# Patient Record
Sex: Female | Born: 1945 | Race: White | Hispanic: Yes | Marital: Married | Smoking: Never smoker
Health system: Southern US, Community
[De-identification: ages and names within clinical notes are randomized; demographics above are authoritative.]

## PROBLEM LIST (undated history)

## (undated) DIAGNOSIS — R002 Palpitations: Secondary | ICD-10-CM

## (undated) DIAGNOSIS — R7611 Nonspecific reaction to tuberculin skin test without active tuberculosis: Secondary | ICD-10-CM

## (undated) DIAGNOSIS — I480 Paroxysmal atrial fibrillation: Secondary | ICD-10-CM

## (undated) DIAGNOSIS — I10 Essential (primary) hypertension: Secondary | ICD-10-CM

## (undated) DIAGNOSIS — I2721 Secondary pulmonary arterial hypertension: Secondary | ICD-10-CM

## (undated) DIAGNOSIS — E0500 Thyrotoxicosis with diffuse goiter without thyrotoxic crisis or storm: Secondary | ICD-10-CM

## (undated) DIAGNOSIS — E785 Hyperlipidemia, unspecified: Secondary | ICD-10-CM

## (undated) DIAGNOSIS — K529 Noninfective gastroenteritis and colitis, unspecified: Secondary | ICD-10-CM

## (undated) DIAGNOSIS — I38 Endocarditis, valve unspecified: Secondary | ICD-10-CM

## (undated) DIAGNOSIS — E0590 Thyrotoxicosis, unspecified without thyrotoxic crisis or storm: Secondary | ICD-10-CM

## (undated) DIAGNOSIS — I370 Nonrheumatic pulmonary valve stenosis: Secondary | ICD-10-CM

## (undated) HISTORY — DX: Essential (primary) hypertension: I10

## (undated) HISTORY — DX: Palpitations: R00.2

## (undated) HISTORY — DX: Secondary pulmonary arterial hypertension: I27.21

## (undated) HISTORY — DX: Nonspecific reaction to tuberculin skin test without active tuberculosis: R76.11

## (undated) HISTORY — DX: Noninfective gastroenteritis and colitis, unspecified: K52.9

## (undated) HISTORY — DX: Thyrotoxicosis with diffuse goiter without thyrotoxic crisis or storm: E05.00

## (undated) HISTORY — DX: Endocarditis, valve unspecified: I38

## (undated) HISTORY — DX: Paroxysmal atrial fibrillation: I48.0

## (undated) HISTORY — DX: Nonrheumatic pulmonary valve stenosis: I37.0

---

## 2012-10-15 ENCOUNTER — Encounter: Payer: Self-pay | Admitting: Cardiovascular Disease

## 2012-10-15 ENCOUNTER — Ambulatory Visit (INDEPENDENT_AMBULATORY_CARE_PROVIDER_SITE_OTHER): Payer: Medicare Other | Admitting: Cardiovascular Disease

## 2012-10-15 VITALS — BP 160/90 | HR 97 | Ht 63.0 in | Wt 163.8 lb

## 2012-10-15 DIAGNOSIS — I10 Essential (primary) hypertension: Secondary | ICD-10-CM | POA: Insufficient documentation

## 2012-10-15 NOTE — Patient Instructions (Addendum)
Continue same medications.  Call us in 1 week with your blood pressure reading.  Follow up in 6 months.

## 2012-10-15 NOTE — Progress Notes (Signed)
HPI  This is a pleasant 67 year old female who is self-referred for evaluation and management of hypertension. She was diagnosed with hypertension during pregnancy when she was 67 years old. She was placed on medication at that time. She is not aware of history of preeclampsia. She took herself off the medication after delivery. She was noted to be hypertensive again during subsequent pregnancy. She reports being on losartan for many years but recently she wanted to be off medications. After stopping losartan, she checks her blood pressure was 180/90. She went to the emergency room [facility]. She reports having negative workup. She was seen by her family care physician Dr. Hollice Espy. She was resumed on losartan and subsequently hydrochlorothiazide was added and titrated to 25 mg once daily. Her blood pressure is controlled since then. She is completely asymptomatic and denies any chest pain, dyspnea, palpitations or headache. She is physically active and reports no exertional symptoms. She is not aware of any previous cardiac history. There is no family history of premature coronary artery disease. She is not a smoker.   No Known Allergies   No current outpatient prescriptions on file prior to visit.   No current facility-administered medications on file prior to visit.     Past Medical History  Diagnosis Date  . Hypertension      History reviewed. No pertinent past surgical history.   History reviewed. No pertinent family history.   History   Social History  . Marital Status: Married    Spouse Name: N/A    Number of Children: N/A  . Years of Education: N/A   Occupational History  . Not on file.   Social History Main Topics  . Smoking status: Never Smoker   . Smokeless tobacco: Not on file  . Alcohol Use: Not on file  . Drug Use: Not on file  . Sexually Active: Not on file   Other Topics Concern  . Not on file   Social History Narrative  . No narrative on file       ROS Constitutional: Negative for fever, chills, diaphoresis, activity change, appetite change and fatigue.  HENT: Negative for hearing loss, nosebleeds, congestion, sore throat, facial swelling, drooling, trouble swallowing, neck pain, voice change, sinus pressure and tinnitus.  Eyes: Negative for photophobia, pain, discharge and visual disturbance.  Respiratory: Negative for apnea, cough, chest tightness, shortness of breath and wheezing.  Cardiovascular: Negative for chest pain, palpitations and leg swelling.  Gastrointestinal: Negative for nausea, vomiting, abdominal pain, diarrhea, constipation, blood in stool and abdominal distention.  Genitourinary: Negative for dysuria, urgency, frequency, hematuria and decreased urine volume.  Musculoskeletal: Negative for myalgias, back pain, joint swelling, arthralgias and gait problem.  Skin: Negative for color change, pallor, rash and wound.  Neurological: Negative for dizziness, tremors, seizures, syncope, speech difficulty, weakness, light-headedness, numbness and headaches.  Psychiatric/Behavioral: Negative for suicidal ideas, hallucinations, behavioral problems and agitation. The patient is not nervous/anxious.     PHYSICAL EXAM   BP 160/90  Pulse 97  Ht 5\' 3"  (1.6 m)  Wt 163 lb 12 oz (74.277 kg)  BMI 29.01 kg/m2 Constitutional: She is oriented to person, place, and time. She appears well-developed and well-nourished. No distress.  HENT: No nasal discharge.  Head: Normocephalic and atraumatic.  Eyes: Pupils are equal and round. Right eye exhibits no discharge. Left eye exhibits no discharge.  Neck: Normal range of motion. Neck supple. No JVD present. No thyromegaly present.  Cardiovascular: Normal rate, regular rhythm, normal heart  sounds. Exam reveals no gallop and no friction rub. No murmur heard.  Pulmonary/Chest: Effort normal and breath sounds normal. No stridor. No respiratory distress. She has no wheezes. She has no rales.  She exhibits no tenderness.  Abdominal: Soft. Bowel sounds are normal. She exhibits no distension. There is no tenderness. There is no rebound and no guarding. No abdominal bruit.  Musculoskeletal: Normal range of motion. She exhibits no edema and no tenderness.  Neurological: She is alert and oriented to person, place, and time. Coordination normal.  Skin: Skin is warm and dry. No rash noted. She is not diaphoretic. No erythema. No pallor.  Psychiatric: She has a normal mood and affect. Her behavior is normal. Judgment and thought content normal.     EKG: Normal sinus rhythm with nonspecific T wave changes.    ASSESSMENT AND PLAN

## 2012-10-15 NOTE — Assessment & Plan Note (Signed)
The patient appears to have essential hypertension. She started having hypertension at an early age. I discussed with her that most patients with essential hypertension require at least 2 or 3 blood pressure medications in order to control blood pressure. She is already compliant with low sodium diet but the biggest issue seems to be stress which is likely contributing to elevated blood pressure. She reports having routine labs recently and all within normal limits. I advised her to continue treatment with losartan 50 mg once daily and hydrochlorothiazide 25 mg once daily. If her blood pressure remains elevated, losartan can be increased to 100 mg once daily. Given her significant anxiety, a small dose carvedilol can be considered if her blood pressure remains elevated. She has no cardiac symptoms.

## 2012-10-22 ENCOUNTER — Telehealth: Payer: Self-pay | Admitting: *Deleted

## 2012-10-22 ENCOUNTER — Other Ambulatory Visit: Payer: Self-pay

## 2012-10-22 MED ORDER — LOSARTAN POTASSIUM 100 MG PO TABS: 100.0000 mg | ORAL_TABLET | Freq: Every day | ORAL | Status: DC

## 2012-10-22 NOTE — Telephone Encounter (Signed)
See below

## 2012-10-22 NOTE — Telephone Encounter (Signed)
Increase losartan to 100 mg once daily. We can combine Losartan/HCTZ 100/25 mg once daily.

## 2012-10-22 NOTE — Telephone Encounter (Signed)
Pt informed Understanding verb RX sent to pharm 

## 2012-10-22 NOTE — Telephone Encounter (Signed)
Pt calling with BP readings 146/83 p64 151/81 p83 143/85 p86 146/83 p86 149/79 p83 141/88 p88

## 2012-10-25 ENCOUNTER — Telehealth: Payer: Self-pay

## 2012-10-25 NOTE — Telephone Encounter (Signed)
Please call regarding the HCTZ. The patient had taken ziac in the past and would like to know the difference between the two medications since she had taken the ziac in the past and wanted to make sure she should continue on the HCTZ since she will have to renew it today. Her BP yesterday was 157/88 HR 68. Please call regarding medications.

## 2012-10-25 NOTE — Telephone Encounter (Signed)
Please advise 

## 2012-10-25 NOTE — Telephone Encounter (Signed)
Ziac is a combination medication that has a beta blocker and HCTZ. They are not the same.

## 2012-10-26 NOTE — Telephone Encounter (Signed)
Pt informed Understanding verb 

## 2012-11-18 ENCOUNTER — Other Ambulatory Visit: Payer: Self-pay | Admitting: *Deleted

## 2012-11-22 ENCOUNTER — Other Ambulatory Visit: Payer: Self-pay | Admitting: *Deleted

## 2013-02-23 ENCOUNTER — Other Ambulatory Visit: Payer: Self-pay

## 2013-02-23 MED ORDER — HYDROCHLOROTHIAZIDE 12.5 MG PO CAPS: 25.0000 mg | ORAL_CAPSULE | Freq: Every day | ORAL | Status: DC

## 2013-04-15 ENCOUNTER — Encounter: Payer: Self-pay | Admitting: Cardiovascular Disease

## 2013-04-15 ENCOUNTER — Ambulatory Visit (INDEPENDENT_AMBULATORY_CARE_PROVIDER_SITE_OTHER): Payer: Medicare Other | Admitting: Cardiovascular Disease

## 2013-04-15 VITALS — BP 144/82 | HR 73 | Ht 63.0 in | Wt 162.5 lb

## 2013-04-15 DIAGNOSIS — I10 Essential (primary) hypertension: Secondary | ICD-10-CM

## 2013-04-15 NOTE — Patient Instructions (Addendum)
Continue same medications  Follow up in 1 year

## 2013-04-15 NOTE — Assessment & Plan Note (Signed)
She is doing very well on current dose of losartan 100 mg once daily and hydrochlorothiazide 25 mg once daily. Blood pressure is under excellent control and she has no cardiac symptoms or any symptoms related to hypotension. I recommend continuing current medications. I gave her the option of combining the 2 medications together but she prefers to keep them separated. Followup in one year or earlier if needed.

## 2013-04-15 NOTE — Progress Notes (Signed)
Primary care physician: Dr. Barron Alvine  HPI  This is a pleasant 67 year old female who is here today for a followup visit regarding hypertension. She was diagnosed with hypertension during pregnancy when she was 67 years old. She was placed on a medication at that time. She is not aware of history of preeclampsia. She took herself off the medication after delivery. She was noted to be hypertensive again during subsequent pregnancy. She reports being on losartan for many years but recently she wanted to be off medications. After stopping losartan, blood pressure increased significantly. She went to the emergency room at [facility]. She reports having negative workup.  Blood pressure was subsequently controlled with losartan and hydrochlorothiazide with up titration with a dose of 100 and 25 mg.  She has been doing very well and denies any chest pain, dyspnea, dizziness or headache. Home blood pressure is under excellent control.    No Known Allergies   Current Outpatient Prescriptions on File Prior to Visit  Medication Sig Dispense Refill  . hydrochlorothiazide (MICROZIDE) 12.5 MG capsule Take 2 capsules (25 mg total) by mouth daily.  30 capsule  5  . losartan (COZAAR) 100 MG tablet Take 1 tablet (100 mg total) by mouth daily.  90 tablet  3   No current facility-administered medications on file prior to visit.     Past Medical History  Diagnosis Date  . Hypertension      History reviewed. No pertinent past surgical history.   Family History  Problem Relation Age of Onset  . Family history unknown: Yes     History   Social History  . Marital Status: Married    Spouse Name: N/A    Number of Children: N/A  . Years of Education: N/A   Occupational History  . Not on file.   Social History Main Topics  . Smoking status: Never Smoker   . Smokeless tobacco: Not on file  . Alcohol Use: No  . Drug Use: No  . Sexual Activity: Not on file   Other Topics Concern  .  Not on file   Social History Narrative  . No narrative on file      PHYSICAL EXAM   BP 144/82  Pulse 73  Ht 5\' 3"  (1.6 m)  Wt 162 lb 8 oz (73.71 kg)  BMI 28.79 kg/m2 Constitutional: She is oriented to person, place, and time. She appears well-developed and well-nourished. No distress.  HENT: No nasal discharge.  Head: Normocephalic and atraumatic.  Eyes: Pupils are equal and round. Right eye exhibits no discharge. Left eye exhibits no discharge.  Neck: Normal range of motion. Neck supple. No JVD present. No thyromegaly present.  Cardiovascular: Normal rate, regular rhythm, normal heart sounds. Exam reveals no gallop and no friction rub. No murmur heard.  Pulmonary/Chest: Effort normal and breath sounds normal. No stridor. No respiratory distress. She has no wheezes. She has no rales. She exhibits no tenderness.  Abdominal: Soft. Bowel sounds are normal. She exhibits no distension. There is no tenderness. There is no rebound and no guarding. No abdominal bruit.  Musculoskeletal: Normal range of motion. She exhibits no edema and no tenderness.  Neurological: She is alert and oriented to person, place, and time. Coordination normal.  Skin: Skin is warm and dry. No rash noted. She is not diaphoretic. No erythema. No pallor.  Psychiatric: She has a normal mood and affect. Her behavior is normal. Judgment and thought content normal.     YNW:GNFAO  Rhythm  -  Low voltage -  Borderline EKG    ASSESSMENT AND PLAN

## 2013-04-20 ENCOUNTER — Other Ambulatory Visit: Payer: Self-pay

## 2013-04-20 MED ORDER — LOSARTAN POTASSIUM 100 MG PO TABS: 100.0000 mg | ORAL_TABLET | Freq: Every day | ORAL | Status: DC

## 2013-04-20 MED ORDER — HYDROCHLOROTHIAZIDE 12.5 MG PO CAPS: 25.0000 mg | ORAL_CAPSULE | Freq: Every day | ORAL | Status: DC

## 2013-04-26 ENCOUNTER — Telehealth: Payer: Self-pay

## 2013-04-26 MED ORDER — LOSARTAN POTASSIUM-HCTZ 100-25 MG PO TABS: 1.0000 | ORAL_TABLET | Freq: Every day | ORAL | Status: DC

## 2013-04-26 NOTE — Telephone Encounter (Signed)
A new Rx sent for Losartan HCT 100/25 mg take one tablet daily.

## 2013-12-19 ENCOUNTER — Ambulatory Visit (INDEPENDENT_AMBULATORY_CARE_PROVIDER_SITE_OTHER): Payer: Medicare Other | Admitting: *Deleted

## 2013-12-19 ENCOUNTER — Encounter: Payer: Self-pay | Admitting: *Deleted

## 2013-12-19 ENCOUNTER — Telehealth: Payer: Self-pay | Admitting: *Deleted

## 2013-12-19 VITALS — BP 184/99 | HR 88 | Ht 63.0 in | Wt 158.0 lb

## 2013-12-19 DIAGNOSIS — I10 Essential (primary) hypertension: Secondary | ICD-10-CM

## 2013-12-19 MED ORDER — AMLODIPINE BESYLATE 5 MG PO TABS: 5.0000 mg | ORAL_TABLET | Freq: Every day | ORAL | Status: DC

## 2013-12-19 NOTE — Telephone Encounter (Signed)
Patient came in for nurse visit this morning with elevated blood pressure 184/99 Per verbal from Dr. Kirke CorinArida  Start Amlodipine 5 mg once daily  Patient verbalized understanding Patient instructed to call if elevated blood pressure persists

## 2013-12-19 NOTE — Progress Notes (Signed)
Patient is supposed to leave for [place] this morning  She checked her blood pressure this morning and it was 177/83 She felt like it might be stress because she is leaving town  She decided to come to the office to have it checked  Blood pressure was 184/99 during her office visit  She denies any symptoms other than "feeling stressed"  I told her that if she needed to get started on her trip and I could call her on her mobile phone after talking to Dr. Kirke CorinArida  She stated that she was postponing her trip until Dr. Kirke CorinArida reviewed her current blood pressure

## 2013-12-19 NOTE — Patient Instructions (Signed)
Your blood pressure is elevated  I will discuss this with Dr. Kirke CorinArida and call you

## 2014-01-26 DIAGNOSIS — I10 Essential (primary) hypertension: Secondary | ICD-10-CM | POA: Insufficient documentation

## 2014-02-08 ENCOUNTER — Ambulatory Visit: Payer: Medicare Other | Admitting: Internal Medicine

## 2014-03-06 ENCOUNTER — Telehealth: Payer: Self-pay | Admitting: *Deleted

## 2014-03-06 MED ORDER — AMLODIPINE BESYLATE 2.5 MG PO TABS: 2.5000 mg | ORAL_TABLET | Freq: Every day | ORAL | Status: DC

## 2014-03-06 NOTE — Telephone Encounter (Signed)
Informed patient that Dr. Kirke CorinArida is decreasing her Norvasc to 2.5 mg daily  Patient verbalized understanding

## 2014-03-06 NOTE — Telephone Encounter (Signed)
Patient thinks she is not tolerating norvasc very well  When she started the 5 mg she felt dizzy at times and "just did not feel like herself" When her dose was increased to 10 mg it made her stomach feel weak" Her issues start usually about 1 hour after she takes her Norvasc  She denies any other symptoms and stated that she does not know how to describe her symptoms  She said her blood pressure is 140/70 on average during the times that she is not feeling well

## 2014-03-06 NOTE — Telephone Encounter (Signed)
Patient having a reaction to Amilodipine. She feels very weak. Patient had a bad headache and the urgent doctor changed it to  10mg . Patient isn't sure what to do? She is worried her headache will come back with the 5mg  but the 10 mg makes her feel weak.

## 2014-03-06 NOTE — Telephone Encounter (Signed)
Decrease Amlodipine to 2.5 mg once daily.

## 2014-03-07 ENCOUNTER — Telehealth: Payer: Self-pay

## 2014-03-07 NOTE — Telephone Encounter (Signed)
Patient called and said she started the new dose of Norvasc 2.5 mg  She did not feel as bad as she felt on the 5 mg but it still made her feel weak and unwell  She wants to ask Dr. Kirke CorinArida if is another medication she can try

## 2014-03-07 NOTE — Telephone Encounter (Signed)
We can try carvedilol 6.25 mg bid and stop Amlodipine.

## 2014-03-07 NOTE — Telephone Encounter (Signed)
Pt called stating that Joni Reiningicole asked her to call regarding her medications.

## 2014-03-08 MED ORDER — CARVEDILOL 6.25 MG PO TABS: 6.2500 mg | ORAL_TABLET | Freq: Two times a day (BID) | ORAL | Status: DC

## 2014-03-08 NOTE — Telephone Encounter (Signed)
Instructed patient per Dr. Kirke CorinArida to stop Amlodipine and start Carvedilol 6.25 mg twice daily  Instructed her to keep her PCP appt and call us if her symptoms persist  Patient verbalized understanding

## 2014-03-16 ENCOUNTER — Telehealth: Payer: Self-pay | Admitting: *Deleted

## 2014-03-16 NOTE — Telephone Encounter (Signed)
Pt tried taking her Losartan-HCTZ broken into 1/2 tablet in the morning and and 1/2 tablet at lunch as reccommended by her PCP  She said that it made her feel much better and much less lethargic  She wants to know if it is all right with Dr. Kirke CorinArida to continue taking it this way

## 2014-03-16 NOTE — Telephone Encounter (Signed)
Patient called regarding Lopressor. Her pcp told her to take half morning and half at lunch. She does better taking it that way. Is that ok?

## 2014-03-17 NOTE — Telephone Encounter (Signed)
That is fine 

## 2014-03-17 NOTE — Telephone Encounter (Signed)
Informed patient that Dr. Kirke CorinArida said it was fine to take 1/2 a tablet in the morning and 1/2 a tablet at lunch  Patient verbalized understanding

## 2014-03-24 ENCOUNTER — Encounter: Payer: Self-pay | Admitting: Cardiovascular Disease

## 2014-03-24 ENCOUNTER — Ambulatory Visit (INDEPENDENT_AMBULATORY_CARE_PROVIDER_SITE_OTHER): Payer: Medicare Other | Admitting: Cardiovascular Disease

## 2014-03-24 VITALS — BP 152/94 | HR 68 | Ht 63.0 in | Wt 158.5 lb

## 2014-03-24 DIAGNOSIS — I10 Essential (primary) hypertension: Secondary | ICD-10-CM

## 2014-03-24 MED ORDER — AMLODIPINE BESYLATE 5 MG PO TABS: 5.0000 mg | ORAL_TABLET | Freq: Every day | ORAL | Status: DC

## 2014-03-24 NOTE — Assessment & Plan Note (Signed)
She is having extreme fatigue with carvedilol which was stopped today and I switched her back amlodipine 5 mg once daily.

## 2014-03-24 NOTE — Patient Instructions (Signed)
Stop Carvedilol (Coreg).   Start Amlodipine 5 mg once daily.   Your physician wants you to follow-up in: 6 months.  You will receive a reminder letter in the mail two months in advance. If you don't receive a letter, please call our office to schedule the follow-up appointment.

## 2014-03-24 NOTE — Progress Notes (Signed)
    Primary care physician: Dr. Barron Alvineavid Gibson  HPI  This is a pleasant 68 year old female who is here today for a followup visit regarding hypertension. She was diagnosed with hypertension during pregnancy when she was 68 years old. She was placed on a medication at that time. She is not aware of history of preeclampsia. She took herself off the medication after delivery. She was noted to be hypertensive again during subsequent pregnancy. Blood pressure was initially reasonably controlled with losartan-hydrochlorothiazide. It started increasing again and thus amlodipine was added. She reported fatigue and headache with amlodipine which was switched to carvedilol. She reports extreme fatigue with carvedilol and she feels worse than when she was on amlodipine. She saw her primary care physician recently and had routine labs performed which overall were unremarkable.    No Known Allergies   Current Outpatient Prescriptions on File Prior to Visit  Medication Sig Dispense Refill  . carvedilol (COREG) 6.25 MG tablet Take 1 tablet (6.25 mg total) by mouth 2 (two) times daily.  180 tablet  3   No current facility-administered medications on file prior to visit.     Past Medical History  Diagnosis Date  . Hypertension      History reviewed. No pertinent past surgical history.   History reviewed. No pertinent family history.   History   Social History  . Marital Status: Married    Spouse Name: N/A    Number of Children: N/A  . Years of Education: N/A   Occupational History  . Not on file.   Social History Main Topics  . Smoking status: Never Smoker   . Smokeless tobacco: Not on file  . Alcohol Use: No  . Drug Use: No  . Sexual Activity: Not on file   Other Topics Concern  . Not on file   Social History Narrative  . No narrative on file      PHYSICAL EXAM   BP 152/94  Pulse 68  Ht 5\' 3"  (1.6 m)  Wt 158 lb 8 oz (71.895 kg)  BMI 28.08 kg/m2 Constitutional: She is  oriented to person, place, and time. She appears well-developed and well-nourished. No distress.  HENT: No nasal discharge.  Head: Normocephalic and atraumatic.  Eyes: Pupils are equal and round. Right eye exhibits no discharge. Left eye exhibits no discharge.  Neck: Normal range of motion. Neck supple. No JVD present. No thyromegaly present.  Cardiovascular: Normal rate, regular rhythm, normal heart sounds. Exam reveals no gallop and no friction rub. No murmur heard.  Pulmonary/Chest: Effort normal and breath sounds normal. No stridor. No respiratory distress. She has no wheezes. She has no rales. She exhibits no tenderness.  Abdominal: Soft. Bowel sounds are normal. She exhibits no distension. There is no tenderness. There is no rebound and no guarding. No abdominal bruit.  Musculoskeletal: Normal range of motion. She exhibits no edema and no tenderness.  Neurological: She is alert and oriented to person, place, and time. Coordination normal.  Skin: Skin is warm and dry. No rash noted. She is not diaphoretic. No erythema. No pallor.  Psychiatric: She has a normal mood and affect. Her behavior is normal. Judgment and thought content normal.      ASSESSMENT AND PLAN

## 2014-04-02 ENCOUNTER — Other Ambulatory Visit: Payer: Self-pay | Admitting: Cardiovascular Disease

## 2014-04-20 ENCOUNTER — Ambulatory Visit: Payer: Medicare Other | Admitting: Cardiovascular Disease

## 2014-04-21 ENCOUNTER — Ambulatory Visit: Payer: Medicare Other | Admitting: Adult Health

## 2014-07-07 ENCOUNTER — Telehealth: Payer: Self-pay | Admitting: Cardiovascular Disease

## 2014-07-07 NOTE — Telephone Encounter (Signed)
Patients blood pressure last night was 152/80 HR was 62 She did not "feel well or sleep well" She had a "throbbing headache"  Her blood pressure trended down to 143/80 after her medications  She denies any other symptoms   She has appt on 07/17/14 and states she will record her blood pressure readings regularly until then

## 2014-07-07 NOTE — Telephone Encounter (Signed)
Sounds good

## 2014-07-07 NOTE — Telephone Encounter (Signed)
Patient stated she has had a lot of stress this week  She has also had more salt than usual   Patient instructed to keep her follow up appt  Attempt to decrease stress and follow low sodium diet

## 2014-07-07 NOTE — Telephone Encounter (Signed)
New problem     Pt not feeling well and bp is 154. Please call pt.

## 2014-07-12 ENCOUNTER — Encounter: Payer: Self-pay | Admitting: Cardiovascular Disease

## 2014-07-12 ENCOUNTER — Ambulatory Visit (INDEPENDENT_AMBULATORY_CARE_PROVIDER_SITE_OTHER): Payer: Medicare Other | Admitting: Cardiovascular Disease

## 2014-07-12 VITALS — BP 122/76 | HR 67 | Ht 63.0 in | Wt 155.0 lb

## 2014-07-12 DIAGNOSIS — I10 Essential (primary) hypertension: Secondary | ICD-10-CM

## 2014-07-12 NOTE — Assessment & Plan Note (Signed)
Blood pressure is optimally controlled most of the time current medications with minimal side effects. She gets very anxious when blood pressure is elevated. I discussed with her the importance of continued lifestyle changes and low sodium diet. I made no changes in her medications today.

## 2014-07-12 NOTE — Patient Instructions (Signed)
Continue same medications.   Your physician wants you to follow-up in: 6 months.  You will receive a reminder letter in the mail two months in advance. If you don't receive a letter, please call our office to schedule the follow-up appointment.  

## 2014-07-12 NOTE — Progress Notes (Signed)
    Primary care physician: Dr. Barron Alvineavid Gibson  HPI  This is a pleasant 68 year old female who is here today for a followup visit regarding hypertension. She was diagnosed with hypertension during pregnancy when she was 68 years old. She was placed on a medication at that time. She is not aware of history of preeclampsia. She took herself off the medication after delivery. She was noted to be hypertensive again during subsequent pregnancy. Blood pressure was initially reasonably controlled with losartan-hydrochlorothiazide. It started increasing again and thus amlodipine was added. She reported fatigue and headache with amlodipine which was switched to carvedilol. She had extreme fatigue with carvedilol and was switched back to amlodipine. She had some high blood pressure readings last week when she consumed more sodium than usual. Blood pressure improved after in the home readings have been optimal.    No Known Allergies   Current Outpatient Prescriptions on File Prior to Visit  Medication Sig Dispense Refill  . amLODipine (NORVASC) 5 MG tablet Take 1 tablet (5 mg total) by mouth daily. 30 tablet 6  . losartan-hydrochlorothiazide (HYZAAR) 100-25 MG per tablet Take 0.5 tablets by mouth 2 (two) times daily.     No current facility-administered medications on file prior to visit.     Past Medical History  Diagnosis Date  . Hypertension      No past surgical history on file.   No family history on file.   History   Social History  . Marital Status: Married    Spouse Name: N/A    Number of Children: N/A  . Years of Education: N/A   Occupational History  . Not on file.   Social History Main Topics  . Smoking status: Never Smoker   . Smokeless tobacco: Not on file  . Alcohol Use: No  . Drug Use: No  . Sexual Activity: Not on file   Other Topics Concern  . Not on file   Social History Narrative      PHYSICAL EXAM   BP 122/76 mmHg  Pulse 67  Ht 5\' 3"  (1.6 m)   Wt 155 lb (70.308 kg)  BMI 27.46 kg/m2 Constitutional: She is oriented to person, place, and time. She appears well-developed and well-nourished. No distress.  HENT: No nasal discharge.  Head: Normocephalic and atraumatic.  Eyes: Pupils are equal and round. Right eye exhibits no discharge. Left eye exhibits no discharge.  Neck: Normal range of motion. Neck supple. No JVD present. No thyromegaly present.  Cardiovascular: Normal rate, regular rhythm, normal heart sounds. Exam reveals no gallop and no friction rub. No murmur heard.  Pulmonary/Chest: Effort normal and breath sounds normal. No stridor. No respiratory distress. She has no wheezes. She has no rales. She exhibits no tenderness.  Abdominal: Soft. Bowel sounds are normal. She exhibits no distension. There is no tenderness. There is no rebound and no guarding. No abdominal bruit.  Musculoskeletal: Normal range of motion. She exhibits no edema and no tenderness.  Neurological: She is alert and oriented to person, place, and time. Coordination normal.  Skin: Skin is warm and dry. No rash noted. She is not diaphoretic. No erythema. No pallor.  Psychiatric: She has a normal mood and affect. Her behavior is normal. Judgment and thought content normal.    EKG: Normal sinus rhythm with no significant ST or T wave changes.  ASSESSMENT AND PLAN

## 2014-07-17 ENCOUNTER — Ambulatory Visit: Payer: Medicare Other | Admitting: Cardiovascular Disease

## 2014-10-16 ENCOUNTER — Other Ambulatory Visit: Payer: Self-pay | Admitting: Cardiovascular Disease

## 2014-11-13 ENCOUNTER — Telehealth: Payer: Self-pay | Admitting: *Deleted

## 2014-11-13 NOTE — Telephone Encounter (Signed)
Experienced a bug a few weeks ago and since then  middle of the day her BP would spike up.  BP is doing great other  Not sure if we need to change medication or something else.  Feels really weak in the morning Went to ER for BP in march 12th, did blood work and EKG there everything was all right  Pt is just concerned about this.   Please advise.

## 2014-11-13 NOTE — Telephone Encounter (Signed)
Patient stated she had a severe stomach bug with vomiting diarrhea She became so sick at one point that she "passed out"  During the days she was sick her blood pressure would become elevated average 160/90 She ended up in the ED they did a CBC BMP and EKG are were normal according to patient   Her BP is back down to average 128/70 heart rate 66  She states she still feels very tired at times  She is worried this has to do with her blood pressure   Informed patient that her lethargy is more than likely from her stomach bug  Continue to monitor blood pressure  Call if symptoms persist or worsen   Keep May follow up appt

## 2014-11-16 ENCOUNTER — Emergency Department (HOSPITAL_COMMUNITY): Payer: Medicare Other

## 2014-11-16 ENCOUNTER — Encounter (HOSPITAL_COMMUNITY): Payer: Self-pay | Admitting: Emergency Medicine

## 2014-11-16 ENCOUNTER — Emergency Department (HOSPITAL_COMMUNITY)
Admission: EM | Admit: 2014-11-16 | Discharge: 2014-11-16 | Disposition: A | Discharge status: Home / Self Care | Payer: Medicare Other | Attending: Emergency Medicine | Admitting: Emergency Medicine

## 2014-11-16 DIAGNOSIS — Z79899 Other long term (current) drug therapy: Secondary | ICD-10-CM | POA: Diagnosis not present

## 2014-11-16 DIAGNOSIS — R739 Hyperglycemia, unspecified: Secondary | ICD-10-CM | POA: Diagnosis not present

## 2014-11-16 DIAGNOSIS — I10 Essential (primary) hypertension: Secondary | ICD-10-CM | POA: Diagnosis not present

## 2014-11-16 DIAGNOSIS — N390 Urinary tract infection, site not specified: Secondary | ICD-10-CM

## 2014-11-16 DIAGNOSIS — R531 Weakness: Secondary | ICD-10-CM

## 2014-11-16 LAB — CBC WITH DIFFERENTIAL/PLATELET
BASOS ABS: 0 10*3/uL (ref 0.0–0.1)
Basophils Relative: 0 % (ref 0–1)
EOS ABS: 0.2 10*3/uL (ref 0.0–0.7)
Eosinophils Relative: 2 % (ref 0–5)
HCT: 41.1 % (ref 36.0–46.0)
Hemoglobin: 14 g/dL (ref 12.0–15.0)
LYMPHS PCT: 19 % (ref 12–46)
Lymphs Abs: 1.7 10*3/uL (ref 0.7–4.0)
MCH: 30.1 pg (ref 26.0–34.0)
MCHC: 34.1 g/dL (ref 30.0–36.0)
MCV: 88.4 fL (ref 78.0–100.0)
Monocytes Absolute: 0.6 10*3/uL (ref 0.1–1.0)
Monocytes Relative: 6 % (ref 3–12)
Neutro Abs: 6.4 10*3/uL (ref 1.7–7.7)
Neutrophils Relative %: 73 % (ref 43–77)
Platelets: 244 10*3/uL (ref 150–400)
RBC: 4.65 MIL/uL (ref 3.87–5.11)
RDW: 12.8 % (ref 11.5–15.5)
WBC: 8.9 10*3/uL (ref 4.0–10.5)

## 2014-11-16 LAB — URINALYSIS, ROUTINE W REFLEX MICROSCOPIC
BILIRUBIN URINE: NEGATIVE
Glucose, UA: NEGATIVE mg/dL
KETONES UR: NEGATIVE mg/dL
NITRITE: NEGATIVE
Protein, ur: NEGATIVE mg/dL
Specific Gravity, Urine: 1.007 (ref 1.005–1.030)
UROBILINOGEN UA: 0.2 mg/dL (ref 0.0–1.0)
pH: 6.5 (ref 5.0–8.0)

## 2014-11-16 LAB — COMPREHENSIVE METABOLIC PANEL
ALBUMIN: 4.4 g/dL (ref 3.5–5.2)
ALT: 21 U/L (ref 0–35)
AST: 22 U/L (ref 0–37)
Alkaline Phosphatase: 73 U/L (ref 39–117)
Anion gap: 12 (ref 5–15)
BILIRUBIN TOTAL: 1 mg/dL (ref 0.3–1.2)
BUN: 21 mg/dL (ref 6–23)
CALCIUM: 10 mg/dL (ref 8.4–10.5)
CHLORIDE: 96 mmol/L (ref 96–112)
CO2: 27 mmol/L (ref 19–32)
Creatinine, Ser: 0.96 mg/dL (ref 0.50–1.10)
GFR calc Af Amer: 68 mL/min — ABNORMAL LOW (ref >90)
GFR calc non Af Amer: 59 mL/min — ABNORMAL LOW (ref >90)
Glucose, Bld: 150 mg/dL — ABNORMAL HIGH (ref 70–99)
Potassium: 3.5 mmol/L (ref 3.5–5.1)
Sodium: 135 mmol/L (ref 135–145)
Total Protein: 7.2 g/dL (ref 6.0–8.3)

## 2014-11-16 LAB — URINE MICROSCOPIC-ADD ON

## 2014-11-16 LAB — I-STAT TROPONIN, ED: Troponin i, poc: 0 ng/mL (ref 0.00–0.08)

## 2014-11-16 MED ORDER — CEPHALEXIN 500 MG PO CAPS: 500.0000 mg | ORAL_CAPSULE | Freq: Three times a day (TID) | ORAL | Status: AC

## 2014-11-16 MED ORDER — SODIUM CHLORIDE 0.9 % IV BOLUS (SEPSIS)
500.0000 mL | Freq: Once | INTRAVENOUS | Status: AC
  Administered 2014-11-16: 500 mL via INTRAVENOUS

## 2014-11-16 NOTE — Discharge Instructions (Signed)
High Blood Sugar High blood sugar (hyperglycemia) means that the level of sugar in your blood is higher than it should be. Signs of high blood sugar include:  Feeling thirsty.  Frequent peeing (urinating).  Feeling tired or sleepy.  Dry mouth.  Vision changes.  Feeling weak.  Feeling hungry but losing weight.  Numbness and tingling in your hands or feet.  Headache. When you ignore these signs, your blood sugar may keep going up. These problems may get worse, and other problems may begin. HOME CARE  Check your blood sugars as told by your doctor. Write down the numbers with the date and time.  Take the right amount of insulin or diabetes pills at the right time. Write down the dose with date and time.  Refill your insulin or diabetes pills before running out.  Watch what you eat. Follow your meal plan.  Drink liquids without sugar, such as water. Check with your doctor if you have kidney or heart disease.  Follow your doctor's orders for exercise. Exercise at the same time of day.  Keep your doctor's appointments. GET HELP RIGHT AWAY IF:   You have trouble thinking or are confused.  You have fast breathing with fruity smelling breath.  You pass out (faint).  You have 2 to 3 days of high blood sugars and you do not know why.  You have chest pain.  You are feeling sick to your stomach (nauseous) or throwing up (vomiting).  You have sudden vision changes. MAKE SURE YOU:   Understand these instructions.  Will watch your condition.  Will get help right away if you are not doing well or get worse. Document Released: 06/08/2009 Document Revised: 11/03/2011 Document Reviewed: 06/08/2009 [facility] Patient Information 2015 [place], [place]. This information is not intended to replace advice given to you by your health care provider. Make sure you discuss any questions you have with your health care provider. Urinary Tract Infection A urinary tract infection (UTI) can  occur any place along the urinary tract. The tract includes the kidneys, ureters, bladder, and urethra. A type of germ called bacteria often causes a UTI. UTIs are often helped with antibiotic medicine.  HOME CARE   If given, take antibiotics as told by your doctor. Finish them even if you start to feel better.  Drink enough fluids to keep your pee (urine) clear or pale yellow.  Avoid tea, drinks with caffeine, and bubbly (carbonated) drinks.  Pee often. Avoid holding your pee in for a long time.  Pee before and after having sex (intercourse).  Wipe from front to back after you poop (bowel movement) if you are a woman. Use each tissue only once. GET HELP RIGHT AWAY IF:   You have back pain.  You have lower belly (abdominal) pain.  You have chills.  You feel sick to your stomach (nauseous).  You throw up (vomit).  Your burning or discomfort with peeing does not go away.  You have a fever.  Your symptoms are not better in 3 days. MAKE SURE YOU:   Understand these instructions.  Will watch your condition.  Will get help right away if you are not doing well or get worse. Document Released: 01/28/2008 Document Revised: 05/05/2012 Document Reviewed: 03/11/2012 [facility] Patient Information 2015 [place], [place]. This information is not intended to replace advice given to you by your health care provider. Make sure you discuss any questions you have with your health care provider.

## 2014-11-16 NOTE — ED Notes (Signed)
Reports weakness X 3 weeks with worsening episodes, V/D on 3/2, A/O X4, ambulatory and in NAD

## 2014-11-16 NOTE — ED Provider Notes (Signed)
CSN: [id number]     Arrival date & time 11/16/14  1311 History   First MD Initiated Contact with Patient 11/16/14 1648     Chief Complaint  Patient presents with  . Weakness     (Consider location/radiation/quality/duration/timing/severity/associated sxs/prior Treatment) HPI 69 year old female who comes in today stating that she felt weak with the past 3 weeks. 3 weeks ago on March 2 she had 24 hours of nausea vomiting and diarrhea. Her grandchildren and husband had had similar symptoms. She continued to feel weak after that. She ate a brat diet for approximately a week. She states that she continues to feel just generally weak. During that time she was seen at another emergency department and had a CBC chemistry and thyroid studies that she states were reported to her as normal. She specifically denies headache, neck pain, chest pain, dyspnea, abdominal pain, or urinary tract infection symptoms. She has a primary care physician but states that she is seeing a new doctor on April 4. She's been taking all medications as ordered. Past Medical History  Diagnosis Date  . Hypertension    History reviewed. No pertinent past surgical history. No family history on file. History  Substance Use Topics  . Smoking status: Never Smoker   . Smokeless tobacco: Not on file  . Alcohol Use: No   OB History    No data available     Review of Systems  All other systems reviewed and are negative.     Allergies  Review of patient's allergies indicates no known allergies.  Home Medications   Prior to Admission medications   Medication Sig Start Date End Date Taking? Authorizing Provider  acetaminophen (TYLENOL) 325 MG tablet Take 162.5 mg by mouth every 6 (six) hours as needed for headache.   Yes Historical Provider, MD  amLODipine (NORVASC) 5 MG tablet TAKE 1 TABLET BY MOUTH EVERY DAY 10/16/14  Yes Iran OuchMuhammad A Arida, MD  losartan-hydrochlorothiazide (HYZAAR) 100-25 MG per tablet Take 0.5 tablets  by mouth 2 (two) times daily. 04/26/13  Yes [place] OuchMuhammad A Arida, MD  Multiple Vitamin (MULTIVITAMIN WITH MINERALS) TABS tablet Take 1 tablet by mouth daily.   Yes Historical Provider, MD  Probiotic Product (PROBIOTIC DAILY PO) Take 1 capsule by mouth daily.   Yes Historical Provider, MD   BP 153/69 mmHg  Pulse 86  Temp(Src) 98.3 F (36.8 C) (Oral)  Resp 16  SpO2 100% Physical Exam  Constitutional: She is oriented to person, place, and time. She appears well-developed and well-nourished.  HENT:  Head: Normocephalic and atraumatic.  Right Ear: External ear normal.  Left Ear: External ear normal.  Nose: Nose normal.  Mouth/Throat: Oropharynx is clear and moist.  Eyes: Conjunctivae and EOM are normal. Pupils are equal, round, and reactive to light.  Neck: Normal range of motion. Neck supple. No JVD present. No tracheal deviation present. No thyromegaly present.  Cardiovascular: Normal rate, regular rhythm, normal heart sounds and intact distal pulses.   Pulmonary/Chest: Effort normal and breath sounds normal. She has no wheezes.  Abdominal: Soft. Bowel sounds are normal. She exhibits no mass. There is no tenderness. There is no guarding.  Musculoskeletal: Normal range of motion.  Lymphadenopathy:    She has no cervical adenopathy.  Neurological: She is alert and oriented to person, place, and time. She has normal reflexes. No cranial nerve deficit or sensory deficit. Gait normal. GCS eye subscore is 4. GCS verbal subscore is 5. GCS motor subscore is 6.  Reflex Scores:  Bicep reflexes are 2+ on the right side and 2+ on the left side.      Patellar reflexes are 2+ on the right side and 2+ on the left side. Strength is normal and equal throughout. Cranial nerves grossly intact. Patient fluent. No gross ataxia and patient able to ambulate without difficulty.  Skin: Skin is warm and dry.  Psychiatric: She has a normal mood and affect. Her behavior is normal. Judgment and thought content  normal.  Nursing note and vitals reviewed.   ED Course  Procedures (including critical care time) Labs Review Labs Reviewed  COMPREHENSIVE METABOLIC PANEL - Abnormal; Notable for the following:    Glucose, Bld 150 (*)    GFR calc non Af Amer 59 (*)    GFR calc Af Amer 68 (*)    All other components within normal limits  URINALYSIS, ROUTINE W REFLEX MICROSCOPIC - Abnormal; Notable for the following:    Hgb urine dipstick TRACE (*)    Leukocytes, UA MODERATE (*)    All other components within normal limits  CBC WITH DIFFERENTIAL/PLATELET  URINE MICROSCOPIC-ADD ON  Rosezena Sensor, ED    Imaging Review Dg Chest 2 View  11/16/2014   CLINICAL DATA:  Weakness.  EXAM: CHEST  2 VIEW  COMPARISON:  None.  FINDINGS: Heart size and pulmonary vascularity are normal and the lungs are clear except for a tiny area of atelectasis or scarring at the left lung base laterally. No effusions. No significant osseous abnormality.  IMPRESSION: No significant abnormality.   Electronically Signed   By: Francene Boyers M.D.   On: 11/16/2014 18:51     EKG Interpretation   Date/Time:  Thursday November 16 2014 17:17:41 EDT Ventricular Rate:  81 PR Interval:  128 QRS Duration: 79 QT Interval:  386 QTC Calculation: 448 R Axis:   51 Text Interpretation:  Normal sinus rhythm Confirmed by Olamae Ferrara MD, Willisha Sligar  ([id number]) on 11/16/2014 6:32:34 PM      MDM   Final diagnoses:  Weakness  Hyperglycemia  UTI (lower urinary tract infection)    Labs reviewed and patient noted to be hyperglycemic. However, she did have something to eat on her way in although it was a small amount. I have advised the patient that she needs to have this followed up by her primary care physician and should have a fasting blood sugar obtained. She has been eating a fairly healthy diet by history and we discussed diet and exercise. She also has urine that is consistent with urinary tract infection I'll be started on Keflex. We discussed  return precautions and need follow-up and she and her husband voice understanding.    Margarita Grizzle, MD 11/16/14 [phone]

## 2014-11-27 ENCOUNTER — Ambulatory Visit: Payer: Self-pay | Admitting: Nurse Practitioner

## 2014-12-20 ENCOUNTER — Encounter: Payer: Self-pay | Admitting: Internal Medicine

## 2014-12-20 ENCOUNTER — Ambulatory Visit (INDEPENDENT_AMBULATORY_CARE_PROVIDER_SITE_OTHER): Payer: Medicare Other | Admitting: Internal Medicine

## 2014-12-20 VITALS — BP 122/90 | HR 72 | Temp 98.0°F | Resp 12 | Ht 63.0 in | Wt 149.0 lb

## 2014-12-20 DIAGNOSIS — E0590 Thyrotoxicosis, unspecified without thyrotoxic crisis or storm: Secondary | ICD-10-CM | POA: Diagnosis not present

## 2014-12-20 LAB — T3, FREE: T3 FREE: 3.3 pg/mL (ref 2.3–4.2)

## 2014-12-20 LAB — TSH: TSH: 0.19 u[IU]/mL — AB (ref 0.35–4.50)

## 2014-12-20 LAB — T4, FREE: Free T4: 0.99 ng/dL (ref 0.60–1.60)

## 2014-12-20 NOTE — Progress Notes (Signed)
Patient ID: Doris [name]   DOB: 01/21/1946, 69 y.o.   MRN: [id number]   HPI  Doris [name] is a 69 y.o.-year-old [name], referred by her PCP, Tacey RuizEmily Dolleschel, NP (Dr Barron Alvineavid [facility] [place]), in consultation for subclinical thyrotoxicosis.  She had a gastroenteritis on 10/25/2014 >> felt weak after this. [facility] - TSH found low mid-March. She then had the TSH rechecked at a visit with PCP: TSH still low.   She still has weakness despite improving her diet. She feels great on the diet and lost weight: 4 lbs in 1.5 months. The weakness appears mostly in am. She did not have this in the last 5 days.  I reviewed pt's thyroid tests: 11/21/2014: TSH 0.229 (0.450-4.5), TT4 11.9 (4.5-12)  11/04/2014: TSH 0.326 02/2014: TSH 2.530  Pt denies feeling nodules in neck, hoarseness, dysphagia/odynophagia, SOB with lying down; she c/o: - + fatigue - no excessive sweating/heat intolerance - no tremors - no anxiety - no palpitations - no hyperdefecation - no weight loss - no hair loss  Pt does have a FH of thyroid ds. - + FH of thyroid cancer in daughter. No h/o radiation tx to head or neck.  No seaweed or kelp, no recent contrast studies. No steroid use. No herbal supplements. No Biotin use.  I reviewed her chart and she also has a history of HTN.  ROS: Constitutional: no weight gain/loss, no fatigue, no subjective hyperthermia/hypothermia Eyes: no blurry vision, no xerophthalmia ENT: no sore throat, no nodules palpated in throat, no dysphagia/odynophagia, no hoarseness Cardiovascular: no CP/SOB/palpitations/leg swelling Respiratory: no cough/SOB Gastrointestinal: no N/V/D/C Musculoskeletal: no muscle/joint aches Skin: no rashes Neurological: no tremors/numbness/tingling/dizziness, + HA Psychiatric: no depression/anxiety  Past Medical History  Diagnosis Date  . Hypertension    No past surgical history on file.   History   Social  History  . Marital Status: Married    Spouse Name: N/A  . Number of Children: 5   Occupational History  . Retired Runner, broadcasting/film/videoteacher   Social History Main Topics  . Smoking status: Never Smoker   . Smokeless tobacco: Not on file  . Alcohol Use: No  . Drug Use: No   Current Outpatient Prescriptions on File Prior to Visit  Medication Sig Dispense Refill  . amLODipine (NORVASC) 5 MG tablet TAKE 1 TABLET BY MOUTH EVERY DAY 30 tablet 3  . losartan-hydrochlorothiazide (HYZAAR) 100-25 MG per tablet Take 0.5 tablets by mouth 2 (two) times daily.    Marland Kitchen. acetaminophen (TYLENOL) 325 MG tablet Take 162.5 mg by mouth every 6 (six) hours as needed for headache.    . Multiple Vitamin (MULTIVITAMIN WITH MINERALS) TABS tablet Take 1 tablet by mouth daily.    . Probiotic Product (PROBIOTIC DAILY PO) Take 1 capsule by mouth daily.     No current facility-administered medications on file prior to visit.   No Known Allergies   FH: - HTN in M - HL in M, sister - also see HPI  PE: BP 122/90 mmHg  Pulse 72  Temp(Src) 98 F (36.7 C) (Oral)  Resp 12  Ht 5\' 3"  (1.6 m)  Wt 149 lb (67.586 kg)  BMI 26.40 kg/m2  SpO2 98% Wt Readings from Last 3 Encounters:  12/20/14 149 lb (67.586 kg)  07/12/14 155 lb (70.308 kg)  03/24/14 158 lb 8 oz (71.895 kg)   Constitutional: slightly overweight, in NAD Eyes: PERRLA, EOMI, no exophthalmos, no lid lag, no stare ENT: moist  mucous membranes, no thyromegaly, no thyroid bruits, no cervical lymphadenopathy Cardiovascular: RRR, No MRG Respiratory: CTA B Gastrointestinal: abdomen soft, NT, ND, BS+ Musculoskeletal: no deformities, strength intact in all 4 Skin: moist, warm, no rashes Neurological: no tremor with outstretched hands, DTR normal in all 4  ASSESSMENT: 1. Subclinical Thyrotoxicosis  PLAN:  1. Patient with a recently found low TSH x2, without thyrotoxic sxs other than fatigue. The fatigue/weakness started after an episode of gastroenteritis. - she does not  appear to have exogenous causes for the low TSH.  - We discussed that possible causes of thyrotoxicosis are:  Graves ds   Thyroiditis - most likely toxic multinodular goiter/ toxic adenoma (I cannot feel nodules at palpation of her thyroid). - I suggested that we check the TSH, fT3 and fT4  - If the tests remain abnormal, we may need an uptake and scan to differentiate between the 3 above possible etiologies  - possible modalities of treatment for the above conditions, to include methimazole use, radioactive iodine ablation or (last resort) surgery. - I do not feel that we need to add beta blockers at this time, since she is not tachycardic, anxious, or tremulous - I advised her to join my chart to communicate easier - RTC if the above labs are abnormal. If they are normal, come back for labs in 2 months and after this, continue to have las checked by PCP yearly, if normal.  Office Visit on 12/20/2014  Component Date Value Ref Range Status  . TSH 12/20/2014 0.19* 0.35 - 4.50 uIU/mL Final  . Free T4 12/20/2014 0.99  0.60 - 1.60 ng/dL Final  . T3, Free 16/05/9603 3.3  2.3 - 4.2 pg/mL Final   TSH still low, with normal free T4 and free T3 >> consistent with subclinical thyrotoxicosis >> since she is asymptomatic (other than occasional episodes of increased fatigue, which are now almost resolved), I would like to repeat her thyroid tests in 2 months and, if still low, we may need to obtain a thyroid uptake and scan. I still suspect thyroiditis, which will hopefully improve over the next 2 months.

## 2014-12-20 NOTE — Patient Instructions (Signed)
Please stop at the lab.  Please schedule another appointment if the labs are abnormal.   If the labs are normal, please come back for another thyroid test check in ~2 months.

## 2015-01-09 ENCOUNTER — Encounter: Payer: Self-pay | Admitting: Cardiovascular Disease

## 2015-01-09 ENCOUNTER — Ambulatory Visit (INDEPENDENT_AMBULATORY_CARE_PROVIDER_SITE_OTHER): Payer: Medicare Other | Admitting: Cardiovascular Disease

## 2015-01-09 VITALS — BP 112/80 | HR 77 | Ht 63.0 in | Wt 147.0 lb

## 2015-01-09 DIAGNOSIS — I10 Essential (primary) hypertension: Secondary | ICD-10-CM | POA: Diagnosis not present

## 2015-01-09 NOTE — Patient Instructions (Signed)
Medication Instructions: No changes.   Labwork: None.   Procedures/Testing: None.   Follow-Up: 1 year with Dr. Arida.   Any Additional Special Instructions Will Be Listed Below (If Applicable).   

## 2015-01-09 NOTE — Assessment & Plan Note (Signed)
Blood pressure is optimally controlled with minimal side effects. I made no changes to her medications.

## 2015-01-09 NOTE — Progress Notes (Signed)
Primary care physician: Dr. Barron Alvineavid Gibson  HPI  This is a pleasant 69 year old female who is here today for a followup visit regarding hypertension. She was diagnosed with hypertension during pregnancy when she was 69 years old. She was placed on a medication at that time. She is not aware of history of preeclampsia. She took herself off the medication after delivery. She was noted to be hypertensive again during subsequent pregnancy. Blood pressure was initially reasonably controlled with losartan-hydrochlorothiazide. It started increasing again and thus amlodipine was added. She reported fatigue and headache with amlodipine which was switched to carvedilol. She had extreme fatigue with carvedilol and was switched back to amlodipine. She has been doing very well on the current medications were optimal control of blood pressure with minimal side effects. 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 She denies any chest pain or shortness of breath.      No Known Allergies   Current Outpatient Prescriptions on File Prior to Visit  Medication Sig Dispense Refill  . acetaminophen (TYLENOL) 325 MG tablet Take 162.5 mg by mouth every 6 (six) hours as needed for headache.    Marland Kitchen. amLODipine (NORVASC) 5 MG tablet TAKE 1 TABLET BY MOUTH EVERY DAY 30 tablet 3  . losartan-hydrochlorothiazide (HYZAAR) 100-25 MG per tablet Take 0.5 tablets by mouth 2 (two) times daily.    . Multiple Vitamin (MULTIVITAMIN WITH MINERALS) TABS tablet Take 1 tablet by mouth daily.    . Probiotic Product (PROBIOTIC DAILY PO) Take 1 capsule by mouth daily.     No current facility-administered medications on file prior to visit.     Past Medical History  Diagnosis Date  . Hypertension      History reviewed. No pertinent past surgical history.   History reviewed. No pertinent family history.   History   Social History  .  Marital Status: Married    Spouse Name: N/A  . Number of Children: N/A  . Years of Education: N/A   Occupational History  . Not on file.   Social History Main Topics  . Smoking status: Never Smoker   . Smokeless tobacco: Not on file  . Alcohol Use: No  . Drug Use: No  . Sexual Activity: Not on file   Other Topics Concern  . Not on file   Social History Narrative      PHYSICAL EXAM   BP 112/80 mmHg  Pulse 77  Ht 5\' 3"  (1.6 m)  Wt 147 lb (66.679 kg)  BMI 26.05 kg/m2 Constitutional: She is oriented to person, place, and time. She appears well-developed and well-nourished. No distress.  HENT: No nasal discharge.  Head: Normocephalic and atraumatic.  Eyes: Pupils are equal and round. Right eye exhibits no discharge. Left eye exhibits no discharge.  Neck: Normal range of motion. Neck supple. No JVD present. No thyromegaly present.  Cardiovascular: Normal rate, regular rhythm, normal heart sounds. Exam reveals no gallop and no friction rub. No murmur heard.  Pulmonary/Chest: Effort normal and breath sounds normal. No stridor. No respiratory distress. She has no wheezes. She has no rales. She exhibits no tenderness.  Abdominal: Soft. Bowel sounds are normal. She exhibits no distension. There is no tenderness. There is no rebound and no guarding. No abdominal bruit.  Musculoskeletal: Normal range of motion. She exhibits no edema and no tenderness.  Neurological: She is alert and oriented to person, place, and time. Coordination normal.  Skin: Skin  is warm and dry. No rash noted. She is not diaphoretic. No erythema. No pallor.  Psychiatric: She has a normal mood and affect. Her behavior is normal. Judgment and thought content normal.     ASSESSMENT AND PLAN

## 2015-02-02 ENCOUNTER — Ambulatory Visit (INDEPENDENT_AMBULATORY_CARE_PROVIDER_SITE_OTHER): Payer: Medicare Other | Admitting: Neurology

## 2015-02-02 ENCOUNTER — Encounter: Payer: Self-pay | Admitting: Neurology

## 2015-02-02 VITALS — BP 120/78 | HR 76 | Ht 63.0 in | Wt 149.4 lb

## 2015-02-02 DIAGNOSIS — G44209 Tension-type headache, unspecified, not intractable: Secondary | ICD-10-CM | POA: Diagnosis not present

## 2015-02-02 NOTE — Progress Notes (Signed)
[facility] Clinic Note - Initial Visit   Date: 02/02/2015   Doris [name] MRN: [id number] DOB: 12-08-1945   Dear Dr. Hollice Espy:  Thank you for your kind referral of Doris [name] for consultation of headaches. Although her history is well known to you, please allow [place] to reiterate it for the purpose of our medical record. The patient was accompanied to the clinic by self.   History of Present Illness: Doris [name] is a 69 y.o. right-hand female with subclinical thyrotoxicosis and hypertension presenting for evaluation of headaches.    Starting in March 2016, she had gastroenteritis and following this developed intermittent bifrontal throbbing pain. It would occur 2-3 times per week, lasting about 15 minutes, and quickly resolves with half-tablet of tylenol 500mg . She started taking tylenol several times per day and was concerned, so went to see her PCP who referred her here.   Her headaches have significantly improved in frequency now occuring about once per week.  Stress is a trigger. Bright lights and loud noises exacerbates her pain.  There is no associated eye pain, vision changes, numbness/tingling, or dizziness.  She was recently found to have subclinical thyrotoxicosis likely triggered by viral infection and is being followed by Dr. Elvera Lennox.  Overall, she feels well with no new complaints.  Her fatigue is also slowly improving.    Out-side paper records, electronic medical record, and images have been reviewed where available and summarized as:  Component     Latest Ref Rng 12/20/2014  TSH     0.35 - 4.50 uIU/mL 0.19 (L)  Free T4     0.60 - 1.60 ng/dL 6.19  T3, Free     2.3 - 4.2 pg/mL 3.3    Past Medical History  Diagnosis Date  . Hypertension     Past Surgical History  Procedure Laterality Date  . Cesarean section       Medications:  Current Outpatient Prescriptions on File Prior to Visit  Medication Sig Dispense Refill  .  acetaminophen (TYLENOL) 325 MG tablet Take 162.5 mg by mouth every 6 (six) hours as needed for headache.    Marland Kitchen amLODipine (NORVASC) 5 MG tablet TAKE 1 TABLET BY MOUTH EVERY DAY 30 tablet 3  . losartan-hydrochlorothiazide (HYZAAR) 100-25 MG per tablet Take 0.5 tablets by mouth 2 (two) times daily.    . Multiple Vitamin (MULTIVITAMIN WITH MINERALS) TABS tablet Take 1 tablet by mouth daily.     No current facility-administered medications on file prior to visit.    Allergies: No Known Allergies  Family History: Family History  Problem Relation Age of Onset  . Cancer Mother   . Cancer Father   . Thyroid cancer Daughter     Social History: History   Social History  . Marital Status: Married    Spouse Name: N/A  . Number of Children: N/A  . Years of Education: N/A   Occupational History  . Not on file.   Social History Main Topics  . Smoking status: Never Smoker   . Smokeless tobacco: Not on file  . Alcohol Use: No  . Drug Use: No  . Sexual Activity: Not on file   Other Topics Concern  . Not on file   Social History Narrative   Lives with husband in a 2 story home.  Has 5 children.  Retired Architectural technologist.  Education: some college.    Review of Systems:  CONSTITUTIONAL: No fevers, chills, night sweats, or weight loss.  EYES: No visual changes or eye pain ENT: No hearing changes.  No history of nose bleeds.   RESPIRATORY: No cough, wheezing and shortness of breath.   CARDIOVASCULAR: Negative for chest pain, and palpitations.   GI: Negative for abdominal discomfort, blood in stools or black stools.  No recent change in bowel habits.   GU:  No history of incontinence.   MUSCLOSKELETAL: No history of joint pain or swelling.  No myalgias.   SKIN: Negative for lesions, rash, and itching.   HEMATOLOGY/ONCOLOGY: Negative for prolonged bleeding, bruising easily, and swollen nodes.  No history of cancer.   ENDOCRINE: Negative for cold or heat intolerance, polydipsia or  goiter.   PSYCH:  No depression or anxiety symptoms.   NEURO: As Above.   Vital Signs:  BP 120/78 mmHg  Pulse 76  Ht  (1.6 m)  Wt 149 lb 6 oz (67.756 kg)  BMI 26.47 kg/m2  SpO2 99%   General Medical Exam:   General:  Well appearing, comfortable.   Eyes/ENT: see cranial nerve examination.   Neck: No masses appreciated.  Full range of motion without tenderness.  No carotid bruits. Respiratory:  Clear to auscultation, good air entry bilaterally.   Cardiac:  Regular rate and rhythm, no murmur.   Extremities:  No deformities, edema, or skin discoloration.  Skin:  No rashes or lesions.  Neurological Exam: MENTAL STATUS including orientation to time, place, person, recent and remote memory, attention span and concentration, language, and fund of knowledge is normal.  Speech is not dysarthric.  CRANIAL NERVES: II:  No visual field defects.  Unremarkable fundi.   III-IV-VI: Pupils equal round and reactive to light.  Normal conjugate, extra-ocular eye movements in all directions of gaze.  No nystagmus.  No ptosis.   V:  Normal facial sensation.    VII:  Normal facial symmetry and movements.  No pathologic facial reflexes.  VIII:  Normal hearing and vestibular function.   IX-X:  Normal palatal movement.   XI:  Normal shoulder shrug and head rotation.   XII:  Normal tongue strength and range of motion, no deviation or fasciculation.  MOTOR:  No atrophy, fasciculations or abnormal movements.  No pronator drift.  Tone is normal.    Right Upper Extremity:    Left Upper Extremity:    Deltoid  5/5   Deltoid  5/5   Biceps  5/5   Biceps  5/5   Triceps  5/5   Triceps  5/5   Wrist extensors  5/5   Wrist extensors  5/5   Wrist flexors  5/5   Wrist flexors  5/5   Finger extensors  5/5   Finger extensors  5/5   Finger flexors  5/5   Finger flexors  5/5   Dorsal interossei  5/5   Dorsal interossei  5/5   Abductor pollicis  5/5   Abductor pollicis  5/5   Tone (Ashworth scale)  0  Tone  (Ashworth scale)  0   Right Lower Extremity:    Left Lower Extremity:    Hip flexors  5/5   Hip flexors  5/5   Hip extensors  5/5   Hip extensors  5/5   Knee flexors  5/5   Knee flexors  5/5   Knee extensors  5/5   Knee extensors  5/5   Dorsiflexors  5/5   Dorsiflexors  5/5   Plantarflexors  5/5   Plantarflexors  5/5   Toe extensors  5/5  Toe extensors  5/5   Toe flexors  5/5   Toe flexors  5/5   Tone (Ashworth scale)  0  Tone (Ashworth scale)  0   MSRs:  Right                                                                 Left brachioradialis 2+  brachioradialis 2+  biceps 2+  biceps 2+  triceps 2+  triceps 2+  patellar 2+  patellar 2+  ankle jerk 2+  ankle jerk 2+  Hoffman no  Hoffman no  plantar response down  plantar response down   SENSORY:  Normal and symmetric perception of light touch, pinprick, vibration, and proprioception.  Romberg's sign absent.   COORDINATION/GAIT: Normal finger-to- nose-finger and heel-to-shin.  Intact rapid alternating movements bilaterally.  Able to rise from a chair without using arms.  Gait narrow based and stable. Tandem and stressed gait intact.    IMPRESSION: Mrs. Hayashida is a delightful 69 year-old female referred for evaluation of headaches.  Exam is non-focal.  History and normal exam most suggestive of tension headaches, which may have been triggered by viral syndrome. She is doing much better and headaches are becoming less frequent, occuring about once per week.  There are no worrisome signs on exam to do additional testing.  She can treat episodic pain with tylenol as this is working well for her.  Return to clinc as needed    The duration of this appointment visit was 30 minutes of face-to-face time with the patient.  Greater than 50% of this time was spent in counseling, explanation of diagnosis, planning of further management, and coordination of care.   Thank you for allowing me to participate in patient's care.  If I can answer  any additional questions, I would be pleased to do so.    Sincerely,    Donika K. Allena Katz, DO

## 2015-02-02 NOTE — Patient Instructions (Signed)
OK to take tylenol as needed for headaches Return to clinic as needed

## 2015-02-02 NOTE — Progress Notes (Signed)
Note sent

## 2015-02-13 ENCOUNTER — Other Ambulatory Visit: Payer: Self-pay | Admitting: Cardiovascular Disease

## 2015-02-15 ENCOUNTER — Other Ambulatory Visit (INDEPENDENT_AMBULATORY_CARE_PROVIDER_SITE_OTHER): Payer: Medicare Other

## 2015-02-15 DIAGNOSIS — E0590 Thyrotoxicosis, unspecified without thyrotoxic crisis or storm: Secondary | ICD-10-CM

## 2015-02-15 LAB — T3, FREE: T3 FREE: 3.5 pg/mL (ref 2.3–4.2)

## 2015-02-15 LAB — TSH: TSH: 0.19 u[IU]/mL — ABNORMAL LOW (ref 0.35–4.50)

## 2015-02-15 LAB — T4, FREE: FREE T4: 0.96 ng/dL (ref 0.60–1.60)

## 2015-02-19 ENCOUNTER — Telehealth: Payer: Self-pay | Admitting: Internal Medicine

## 2015-02-19 NOTE — Telephone Encounter (Signed)
Patient would like to know if her lab results are available    Thank you

## 2015-02-20 NOTE — Telephone Encounter (Signed)
Called pt and advised her that Dr Elvera LennoxGherghe is still out of the country and it will be next week before we call her with results. Pt voiced understanding.

## 2015-03-02 ENCOUNTER — Ambulatory Visit: Payer: Medicare Other | Admitting: Internal Medicine

## 2015-03-25 LAB — BASIC METABOLIC PANEL
BUN: 25 mg/dL — AB (ref 4–21)
CREATININE: 1 mg/dL (ref 0.5–1.1)
Potassium: 4.3 mmol/L (ref 3.4–5.3)
Sodium: 140 mmol/L (ref 137–147)

## 2015-03-25 LAB — CBC AND DIFFERENTIAL
HEMATOCRIT: 40 % (ref 36–46)
Hemoglobin: 14.2 g/dL (ref 12.0–16.0)
PLATELETS: 287 10*3/uL (ref 150–399)

## 2015-03-30 ENCOUNTER — Other Ambulatory Visit: Payer: Self-pay | Admitting: Cardiovascular Disease

## 2015-05-03 ENCOUNTER — Ambulatory Visit (INDEPENDENT_AMBULATORY_CARE_PROVIDER_SITE_OTHER): Payer: Medicare Other | Admitting: Internal Medicine

## 2015-05-03 ENCOUNTER — Other Ambulatory Visit (INDEPENDENT_AMBULATORY_CARE_PROVIDER_SITE_OTHER): Payer: Medicare Other

## 2015-05-03 ENCOUNTER — Encounter: Payer: Self-pay | Admitting: Internal Medicine

## 2015-05-03 VITALS — BP 118/70 | HR 79 | Temp 98.0°F | Resp 12 | Wt 149.0 lb

## 2015-05-03 DIAGNOSIS — E0590 Thyrotoxicosis, unspecified without thyrotoxic crisis or storm: Secondary | ICD-10-CM

## 2015-05-03 NOTE — Progress Notes (Addendum)
Patient ID: Doris [name], female   DOB: 1945/11/03, 69 y.o.   MRN: [id number]   HPI  Doris [name] is a 69 y.o.-year-old female, initially referred by her PCP, Doris Ruiz, NP (Dr Barron Alvine [facility]), returning for f/u for subclinical thyrotoxicosis. Last visit 5 mo ago.  Reviewed and addended hx: She had a gastroenteritis on 10/25/2014 >> felt weak after this. She presented to [facility] for the weakness - TSH found low mid-March. She then had the TSH rechecked at a visit with PCP: TSH still low.   At last visit, she still had weakness despite improving her diet. The weakness appeared mostly in am. Now all resolved and feeling much better.  I reviewed pt's thyroid tests: Lab Results  Component Value Date   TSH 0.19* 02/15/2015   TSH 0.19* 12/20/2014   FREET4 0.96 02/15/2015   FREET4 0.99 12/20/2014   11/21/2014: TSH 0.229 (0.450-4.5), TT4 11.9 (4.5-12)  11/04/2014: TSH 0.326 02/2014: TSH 2.530  Pt denies feeling nodules in neck, hoarseness, dysphagia/odynophagia, SOB with lying down; she c/o: - no fatigue - no excessive sweating/heat intolerance - no tremors - no anxiety - no palpitations - no hyperdefecation - no weight loss - no hair loss  Pt does have a FH of thyroid ds. - + FH of thyroid cancer in daughter. No h/o radiation tx to head or neck.  No seaweed or kelp, no recent contrast studies. No steroid use. No herbal supplements. No Biotin use.  I reviewed her chart and she also has a history of HTN.  ROS: Constitutional: no weight gain/loss, no fatigue, no subjective hyperthermia/hypothermia Eyes: no blurry vision, no xerophthalmia ENT: no sore throat, no nodules palpated in throat, no dysphagia/odynophagia, no hoarseness Cardiovascular: no CP/SOB/palpitations/leg swelling Respiratory: no cough/SOB Gastrointestinal: no N/V/D/C Musculoskeletal: no muscle/joint aches Skin: no rashes Neurological: no  tremors/numbness/tingling/dizziness Psychiatric: no depression/anxiety  I reviewed pt's medications, allergies, PMH, social hx, family hx, and changes were documented in the history of present illness. Otherwise, unchanged from my initial visit note.  Past Medical History  Diagnosis Date  . Hypertension    Past Surgical History  Procedure Laterality Date  . Cesarean section       History   Social History  . Marital Status: Married    Spouse Name: N/A  . Number of Children: 5   Occupational History  . Retired Runner, broadcasting/film/video   Social History Main Topics  . Smoking status: Never Smoker   . Smokeless tobacco: Not on file  . Alcohol Use: No  . Drug Use: No   Current Outpatient Prescriptions on File Prior to Visit  Medication Sig Dispense Refill  . acetaminophen (TYLENOL) 325 MG tablet Take 162.5 mg by mouth every 6 (six) hours as needed for headache.    Marland Kitchen amLODipine (NORVASC) 5 MG tablet TAKE 1 TABLET BY MOUTH EVERY DAY 30 tablet 3  . losartan-hydrochlorothiazide (HYZAAR) 100-25 MG per tablet Take 0.5 tablets by mouth 2 (two) times daily.    Marland Kitchen losartan-hydrochlorothiazide (HYZAAR) 100-25 MG per tablet TAKE 1 TABLET BY MOUTH EVERY DAY 90 tablet 3  . Multiple Vitamin (MULTIVITAMIN WITH MINERALS) TABS tablet Take 1 tablet by mouth daily.     No current facility-administered medications on file prior to visit.   No Known Allergies   FH: - HTN in M - HL in M, sister - also see HPI  PE: BP 118/70 mmHg  Pulse 79  Temp(Src) 98 F (36.7 C) (Oral)  Resp 12  Wt 149 lb (67.586 kg)  SpO2 96% Body mass index is 26.4 kg/(m^2). Wt Readings from Last 3 Encounters:  05/03/15 149 lb (67.586 kg)  02/02/15 149 lb 6 oz (67.756 kg)  01/09/15 147 lb (66.679 kg)   Constitutional: slightly overweight, in NAD Eyes: PERRLA, EOMI, no exophthalmos, no lid lag, no stare ENT: moist mucous membranes, no thyromegaly, no thyroid bruits, no cervical lymphadenopathy Cardiovascular: RRR, No  MRG Respiratory: CTA B Gastrointestinal: abdomen soft, NT, ND, BS+ Musculoskeletal: no deformities, strength intact in all 4 Skin: moist, warm, no rashes Neurological: no tremor with outstretched hands, DTR normal in all 4  ASSESSMENT: 1. Subclinical Thyrotoxicosis  PLAN:  1. Patient with several low TSH levels, without thyrotoxic sxs. She had fatigue/weakness that started after an episode of gastroenteritis this spring >> now resolved. Last TSH stable, but lower then normal. - I suggested that we check the TSH, fT3 and fT4 today - If the tests remain abnormal, we may need an uptake and scan to differentiate between the 3 above possible etiologies >> she agrees with this - I do not feel that we need to add beta blockers at this time, since she is not tachycardic, anxious, or tremulous - I advised her to join my chart to communicate easier - RTC in 6 mo and after this,only if the above labs are abnormal  Appointment on 05/03/2015  Component Date Value Ref Range Status  . TSH 05/03/2015 0.08* 0.35 - 4.50 uIU/mL Final  . Free T4 05/03/2015 1.35  0.60 - 1.60 ng/dL Final  . T3, Free 29/56/2130 3.8  2.3 - 4.2 pg/mL Final   TSH is lower. At this point, I would suggest to check an uptake and scan. I will order. No treatment required now, since she does not have signs or symptoms of hyperthyroidism. We'll continue to monitor her thyroid tests. We will need another check soon.  CLINICAL DATA: Sub clinical hyperthyroidism  EXAM: THYROID SCAN AND UPTAKE - 24 HOURS  TECHNIQUE: Following the per oral administration of I-131 sodium iodide, the patient returned at 24 hours and uptake measurements were acquired with the uptake probe centered on the neck. Thyroid imaging was performed following the intravenous administration of the Tc-22m Pertechnetate.  RADIOPHARMACEUTICALS: 8.5 microCuries I-131 sodium iodide orally and 9.8 mCi Technetium-27m pertechnetate IV  COMPARISON:  None  FINDINGS: The 24 hour radioactive iodine uptake is equal to 40.9%. There is uniform tracer uptake throughout both lobes. No dominant hot or cold nodule identified.  IMPRESSION: 1. Elevated 24 hour radioactive uptake equal 40.9%. 2. Patient's hyperthyroidism may be due to Grave's disease.   Electronically Signed By: Signa Kell M.D. On: 05/29/2015 14:02  New diagnosis of Graves' disease. She is not symptomatic, but I would like to start her on methimazole 5 mg daily to improve her TSH, and reduce her cardiac risk. I will recheck her thyroid tests in 5-6 weeks.

## 2015-05-03 NOTE — Patient Instructions (Signed)
Please stop at the lab.  Please come back for a follow-up appointment in 6 months.  

## 2015-05-04 LAB — TSH: TSH: 0.08 u[IU]/mL — ABNORMAL LOW (ref 0.35–4.50)

## 2015-05-04 LAB — T3, FREE: T3, Free: 3.8 pg/mL (ref 2.3–4.2)

## 2015-05-04 LAB — T4, FREE: Free T4: 1.35 ng/dL (ref 0.60–1.60)

## 2015-05-28 ENCOUNTER — Telehealth: Payer: Self-pay

## 2015-05-28 ENCOUNTER — Ambulatory Visit (HOSPITAL_COMMUNITY)
Admission: RE | Admit: 2015-05-28 | Discharge: 2015-05-28 | Disposition: A | Discharge status: Home / Self Care | Payer: Medicare Other | Source: Ambulatory Visit | Attending: Internal Medicine | Admitting: Internal Medicine

## 2015-05-28 NOTE — Telephone Encounter (Signed)
Pt has a question regarding her BP medication. States she is having palpitation. States she "feels like her HR is irregular" .States BP readings are "good".  BP readings: 9/29- 118/61 HR 70 9/19- 134/70 HR 79 9/10- 130/70 HR 77(states this day she  Felt it was elevated) 9/5- 125/65 HR 71  Please call.

## 2015-05-28 NOTE — Telephone Encounter (Signed)
S/w pt who states she is under the care of Dr. Lafe Garin, endocrinologist, for hyperthyroidism and states she was told one of the side effects of this was a rapid heart beat. Reports the vitals signs below. Advised pt that these are WNL however, she should continue to monitor and report any abnormal readings to [place]. States she had thyroid test today and one tomorrow and will await results. Advied pt to contact us with further questions. Pt verbalized understanding with no further questions.

## 2015-05-28 NOTE — Telephone Encounter (Signed)
Pt inquired of what a palpitation was. Reviewed s/s. Pt verbalized understanding and will continue to monitor.

## 2015-05-29 ENCOUNTER — Encounter: Payer: Self-pay | Admitting: *Deleted

## 2015-05-29 ENCOUNTER — Ambulatory Visit (HOSPITAL_COMMUNITY)
Admission: RE | Admit: 2015-05-29 | Discharge: 2015-05-29 | Disposition: A | Discharge status: Home / Self Care | Payer: Medicare Other | Source: Ambulatory Visit | Attending: Internal Medicine | Admitting: Internal Medicine

## 2015-05-29 ENCOUNTER — Telehealth: Payer: Self-pay | Admitting: Internal Medicine

## 2015-05-29 DIAGNOSIS — E0590 Thyrotoxicosis, unspecified without thyrotoxic crisis or storm: Secondary | ICD-10-CM | POA: Insufficient documentation

## 2015-05-29 MED ORDER — METHIMAZOLE 5 MG PO TABS: 5.0000 mg | ORAL_TABLET | Freq: Three times a day (TID) | ORAL | Status: DC

## 2015-05-29 MED ORDER — SODIUM PERTECHNETATE TC 99M INJECTION
9.8000 | Freq: Once | INTRAVENOUS | Status: AC | PRN
  Administered 2015-05-29: 10 via INTRAVENOUS

## 2015-05-29 MED ORDER — SODIUM IODIDE I 131 CAPSULE
8.5000 | Freq: Once | INTRAVENOUS | Status: AC | PRN
  Administered 2015-05-29: 8.5 via ORAL

## 2015-05-29 NOTE — Telephone Encounter (Signed)
Please return pts call she was unable to get the message left

## 2015-05-29 NOTE — Addendum Note (Signed)
Addended by: Carlus Pavlov on: 05/29/2015 02:14 PM   Modules accepted: Orders

## 2015-05-29 NOTE — Telephone Encounter (Signed)
Returned pt's call and advised her per Dr Charlean Sanfilippo result note of her uptake and scan. Pt voiced understanding, scheduled her follow up lab appt and requested her lab results be mailed to her.

## 2015-06-07 ENCOUNTER — Encounter: Payer: Self-pay | Admitting: Nurse Practitioner

## 2015-06-07 ENCOUNTER — Encounter (INDEPENDENT_AMBULATORY_CARE_PROVIDER_SITE_OTHER): Payer: Self-pay

## 2015-06-07 ENCOUNTER — Ambulatory Visit (INDEPENDENT_AMBULATORY_CARE_PROVIDER_SITE_OTHER): Payer: Medicare Other | Admitting: Nurse Practitioner

## 2015-06-07 VITALS — BP 130/78 | HR 88 | Temp 97.4°F | Resp 14 | Ht 63.0 in | Wt 144.4 lb

## 2015-06-07 DIAGNOSIS — Z13820 Encounter for screening for osteoporosis: Secondary | ICD-10-CM | POA: Diagnosis not present

## 2015-06-07 DIAGNOSIS — E0590 Thyrotoxicosis, unspecified without thyrotoxic crisis or storm: Secondary | ICD-10-CM | POA: Diagnosis not present

## 2015-06-07 DIAGNOSIS — I10 Essential (primary) hypertension: Secondary | ICD-10-CM

## 2015-06-07 DIAGNOSIS — Z7689 Persons encountering health services in other specified circumstances: Secondary | ICD-10-CM

## 2015-06-07 DIAGNOSIS — Z9289 Personal history of other medical treatment: Secondary | ICD-10-CM

## 2015-06-07 DIAGNOSIS — Z7189 Other specified counseling: Secondary | ICD-10-CM

## 2015-06-07 DIAGNOSIS — R7611 Nonspecific reaction to tuberculin skin test without active tuberculosis: Secondary | ICD-10-CM

## 2015-06-07 DIAGNOSIS — Z23 Encounter for immunization: Secondary | ICD-10-CM

## 2015-06-07 DIAGNOSIS — Z1239 Encounter for other screening for malignant neoplasm of breast: Secondary | ICD-10-CM

## 2015-06-07 LAB — COMPREHENSIVE METABOLIC PANEL
ALBUMIN: 4.4 g/dL (ref 3.5–5.2)
ALT: 14 U/L (ref 0–35)
AST: 17 U/L (ref 0–37)
Alkaline Phosphatase: 65 U/L (ref 39–117)
BUN: 26 mg/dL — ABNORMAL HIGH (ref 6–23)
CALCIUM: 9.7 mg/dL (ref 8.4–10.5)
CHLORIDE: 98 meq/L (ref 96–112)
CO2: 31 meq/L (ref 19–32)
CREATININE: 0.9 mg/dL (ref 0.40–1.20)
GFR: 65.76 mL/min (ref >60.00)
Glucose, Bld: 96 mg/dL (ref 70–99)
POTASSIUM: 4 meq/L (ref 3.5–5.1)
Sodium: 137 mEq/L (ref 135–145)
Total Bilirubin: 1 mg/dL (ref 0.2–1.2)
Total Protein: 7.6 g/dL (ref 6.0–8.3)

## 2015-06-07 LAB — CBC WITH DIFFERENTIAL/PLATELET
BASOS PCT: 0.1 % (ref 0.0–3.0)
Basophils Absolute: 0 10*3/uL (ref 0.0–0.1)
EOS ABS: 0.3 10*3/uL (ref 0.0–0.7)
Eosinophils Relative: 3.9 % (ref 0.0–5.0)
HEMATOCRIT: 40.7 % (ref 36.0–46.0)
HEMOGLOBIN: 13.8 g/dL (ref 12.0–15.0)
LYMPHS PCT: 20.3 % (ref 12.0–46.0)
Lymphs Abs: 1.5 10*3/uL (ref 0.7–4.0)
MCHC: 34 g/dL (ref 30.0–36.0)
MCV: 87.7 fl (ref 78.0–100.0)
MONOS PCT: 9.7 % (ref 3.0–12.0)
Monocytes Absolute: 0.7 10*3/uL (ref 0.1–1.0)
NEUTROS ABS: 4.8 10*3/uL (ref 1.4–7.7)
Neutrophils Relative %: 66 % (ref 43.0–77.0)
PLATELETS: 217 10*3/uL (ref 150.0–400.0)
RBC: 4.64 Mil/uL (ref 3.87–5.11)
RDW: 12.8 % (ref 11.5–15.5)
WBC: 7.3 10*3/uL (ref 4.0–10.5)

## 2015-06-07 LAB — HEMOGLOBIN A1C: Hgb A1c MFr Bld: 5.5 % (ref 4.6–6.5)

## 2015-06-07 LAB — LDL CHOLESTEROL, DIRECT: LDL DIRECT: 74 mg/dL

## 2015-06-07 NOTE — Patient Instructions (Addendum)
Welcome to [facility]! Nice to meet you.   Thank you for getting your flu vaccine today.   [facility]  Address: [address], [place], [place] [address]  Phone: [phone] Hours:  Monday - Thursday: 8 a.m. - 5 p.m. Friday: 8 a.m. - 3 p.m.

## 2015-06-07 NOTE — Progress Notes (Signed)
Pre visit review using our clinic review tool, if applicable. No additional management support is needed unless otherwise documented below in the visit note. 

## 2015-06-07 NOTE — Progress Notes (Signed)
Patient ID: Doris [name], female    DOB: 06-22-1946  Age: 69 y.o. MRN: [id number]  CC: Establish Care   HPI Doris [name] presents for establishing care and CC of Graves disease.   1) New pt info:   Immunizations- pna 05/15/15 last one she states, will have flu vaccine today   Mammogram- Not UTD, 2006, set up   Bone Density- Set up  Colonoscopy- Not UTD  Eye Exam- 02/13/2015   LMP- post-menopausal   2) Chronic Problems-  Methimazole for Graves since Feb., seeing Dr. Lafe Garin  HTN- Stable. 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 Kirke Corin- Cardiology  Dr. Lafe Garin- [facility]  History Doris [name] has a past medical history of Hypertension; Thyroid disease; and Positive TB test.   She has past surgical history that includes Cesarean section.   Her family history includes Cancer in her father and mother; Thyroid cancer in her daughter.She reports that she has never smoked. She has never used smokeless tobacco. She reports that she does not drink alcohol or use illicit drugs.  Outpatient Prescriptions Prior to Visit  Medication Sig Dispense Refill  . acetaminophen (TYLENOL) 325 MG tablet Take 162.5 mg by mouth every 6 (six) hours as needed for headache.    Marland Kitchen amLODipine (NORVASC) 5 MG tablet TAKE 1 TABLET BY MOUTH EVERY DAY 30 tablet 3  . losartan-hydrochlorothiazide (HYZAAR) 100-25 MG per tablet Take 0.5 tablets by mouth 2 (two) times daily.    . methimazole (TAPAZOLE) 5 MG tablet Take 1 tablet (5 mg total) by mouth 3 (three) times daily. 60 tablet 2  . Multiple Vitamin (MULTIVITAMIN WITH MINERALS) TABS tablet Take 1 tablet by mouth daily.    Marland Kitchen losartan-hydrochlorothiazide (HYZAAR) 100-25 MG per tablet TAKE 1 TABLET BY MOUTH EVERY DAY 90 tablet 3   No facility-administered medications prior to visit.    ROS Review of Systems   Constitutional: Negative for fever, chills, diaphoresis and fatigue.  Respiratory: Negative for chest tightness, shortness of breath and wheezing.   Cardiovascular: Negative for chest pain, palpitations and leg swelling.  Gastrointestinal: Negative for nausea, vomiting and diarrhea.  Skin: Negative for rash.  Neurological: Negative for dizziness, weakness, numbness and headaches.  Psychiatric/Behavioral: The patient is not nervous/anxious.     Objective:  BP 130/78 mmHg  Pulse 88  Temp(Src) 97.4 F (36.3 C)  Resp 14  Ht  (1.6 m)  Wt 144 lb 6.4 oz (65.499 kg)  BMI 25.59 kg/m2  SpO2 99%  Physical Exam  Constitutional: She is oriented to person, place, and time. She appears well-developed and well-nourished. No distress.  HENT:  Head: Normocephalic and atraumatic.  Right Ear: External ear normal.  Left Ear: External ear normal.  Cardiovascular: Normal rate, regular rhythm and normal heart sounds.  Exam reveals no gallop and no friction rub.   No murmur heard. Pulmonary/Chest: Effort normal and breath sounds normal. No respiratory distress. She has no wheezes. She has no rales. She exhibits no tenderness.  Neurological: She is alert and oriented to person, place, and time. No cranial nerve deficit. She exhibits normal muscle tone. Coordination normal.  Skin: Skin is warm and dry. No rash noted. She is not diaphoretic.  Psychiatric: She has a normal mood and affect. Her behavior is normal. Judgment and thought content normal.   Assessment & Plan:  Doris [name] was seen today for establish care.  Diagnoses and all orders for this visit:  Screening for breast cancer -     MM Digital Screening; Future  Screening for osteoporosis -     DG Bone Density; Future  Essential hypertension -     Comprehensive metabolic panel -     CBC w/Diff -     Hemoglobin A1c -     Direct LDL  Subclinical hyperthyroidism -     Comprehensive metabolic panel -     CBC w/Diff -     Hemoglobin  A1c -     Direct LDL  Encounter to establish care  History of positive PPD  Other orders -     Flu Vaccine QUAD 36+ mos IM   I am having Doris [name] maintain her losartan-hydrochlorothiazide, multivitamin with minerals, acetaminophen, amLODipine, and methimazole.  No orders of the defined types were placed in this encounter.     Follow-up: Return in about 4 weeks (around 07/05/2015) for Pt needs AWV- Set up with Denisa.

## 2015-06-11 DIAGNOSIS — Z7689 Persons encountering health services in other specified circumstances: Secondary | ICD-10-CM | POA: Insufficient documentation

## 2015-06-11 DIAGNOSIS — Z1239 Encounter for other screening for malignant neoplasm of breast: Secondary | ICD-10-CM | POA: Insufficient documentation

## 2015-06-11 DIAGNOSIS — Z13820 Encounter for screening for osteoporosis: Secondary | ICD-10-CM | POA: Insufficient documentation

## 2015-06-11 DIAGNOSIS — Z9289 Personal history of other medical treatment: Secondary | ICD-10-CM | POA: Insufficient documentation

## 2015-06-11 NOTE — Assessment & Plan Note (Signed)
Pt was set up for appointment for mammogram and [facility] on [facility] by M. Steelman. Paper with location, hours, and appointment date/time given to patient.

## 2015-06-11 NOTE — Assessment & Plan Note (Signed)
BP Readings from Last 3 Encounters:  06/07/15 130/78  05/03/15 118/70  02/02/15 120/78   Will obtain CMET and CBC w/ diff. Continue current regimen.

## 2015-06-11 NOTE — Assessment & Plan Note (Signed)
Pt was set up for appointment for mammogram and DEXA scan on same day at [facility] by M. Steelman. Paper with location, hours, and appointment date/time given to patient.  

## 2015-06-11 NOTE — Assessment & Plan Note (Signed)
Pt reports 69 years old was exposed to TB and treated. Pt gets chest x-rays.

## 2015-06-11 NOTE — Assessment & Plan Note (Signed)
Pt on Methimazole. Followed by Dr. Elvera LennoxGherghe.

## 2015-06-11 NOTE — Assessment & Plan Note (Signed)
Discussed acute and chronic issues. Reviewed health maintenance measures, PFSHx, and immunizations. Obtain routine labs CBC w/ diff, A1c, and CMET.

## 2015-06-15 ENCOUNTER — Other Ambulatory Visit: Payer: Self-pay | Admitting: Cardiovascular Disease

## 2015-06-18 ENCOUNTER — Ambulatory Visit
Admission: RE | Admit: 2015-06-18 | Discharge: 2015-06-18 | Disposition: A | Discharge status: Home / Self Care | Payer: Medicare Other | Source: Ambulatory Visit | Attending: Nurse Practitioner | Admitting: Nurse Practitioner

## 2015-06-18 ENCOUNTER — Other Ambulatory Visit: Payer: Self-pay | Admitting: Nurse Practitioner

## 2015-06-18 DIAGNOSIS — Z1239 Encounter for other screening for malignant neoplasm of breast: Secondary | ICD-10-CM

## 2015-06-18 DIAGNOSIS — Z1231 Encounter for screening mammogram for malignant neoplasm of breast: Secondary | ICD-10-CM | POA: Insufficient documentation

## 2015-06-18 DIAGNOSIS — Z13820 Encounter for screening for osteoporosis: Secondary | ICD-10-CM | POA: Diagnosis present

## 2015-07-02 LAB — COLOGUARD: COLOGUARD: NEGATIVE

## 2015-07-03 ENCOUNTER — Other Ambulatory Visit (INDEPENDENT_AMBULATORY_CARE_PROVIDER_SITE_OTHER): Payer: Medicare Other

## 2015-07-03 DIAGNOSIS — E0590 Thyrotoxicosis, unspecified without thyrotoxic crisis or storm: Secondary | ICD-10-CM

## 2015-07-03 LAB — T4, FREE: Free T4: 0.63 ng/dL (ref 0.60–1.60)

## 2015-07-03 LAB — TSH: TSH: 0.44 u[IU]/mL (ref 0.35–4.50)

## 2015-07-03 LAB — T3, FREE: T3, Free: 2.6 pg/mL (ref 2.3–4.2)

## 2015-07-09 ENCOUNTER — Other Ambulatory Visit: Payer: Self-pay | Admitting: *Deleted

## 2015-07-09 DIAGNOSIS — E0590 Thyrotoxicosis, unspecified without thyrotoxic crisis or storm: Secondary | ICD-10-CM

## 2015-07-09 MED ORDER — METHIMAZOLE 5 MG PO TABS: 5.0000 mg | ORAL_TABLET | Freq: Two times a day (BID) | ORAL | Status: DC

## 2015-07-23 ENCOUNTER — Telehealth: Payer: Self-pay

## 2015-07-23 NOTE — Telephone Encounter (Signed)
-----   Message from Carrie M Doss, NP sent at 07/16/2015  6:55 AM EST ----- Please let pt know her cologuard was negative. Repeat in 3 years. Thank you! 

## 2015-07-23 NOTE — Telephone Encounter (Signed)
-----   Message from Carollee Leitzarrie M Doss, NP sent at 07/16/2015  6:55 AM EST ----- Please let pt know her cologuard was negative. Repeat in 3 years. Thank you!

## 2015-07-23 NOTE — Telephone Encounter (Signed)
Pt called and I gave her her cologuard results

## 2015-07-23 NOTE — Telephone Encounter (Signed)
LMTCB about cologuard results 

## 2015-07-23 NOTE — Telephone Encounter (Signed)
Pt called and informed her of her cologuard results, pt verbalized inderstanding

## 2015-08-02 ENCOUNTER — Ambulatory Visit (INDEPENDENT_AMBULATORY_CARE_PROVIDER_SITE_OTHER): Payer: Medicare Other

## 2015-08-02 VITALS — BP 128/74 | HR 72 | Temp 97.4°F | Resp 14 | Ht 63.0 in | Wt 151.0 lb

## 2015-08-02 DIAGNOSIS — Z0000 Encounter for general adult medical examination without abnormal findings: Secondary | ICD-10-CM | POA: Diagnosis not present

## 2015-08-02 NOTE — Patient Instructions (Addendum)
Ms. Doris [name],  Thank you for taking time to come for your Medicare Wellness Visit.  I appreciate your ongoing commitment to your health goals. Please review the following plan we discussed and let me know if I can assist you in the future.  Follow up with Primary Care Provider as needed.  Happy Holidays! Health Maintenance, Female Adopting a healthy lifestyle and getting preventive care can go a long way to promote health and wellness. Talk with your health care provider about what schedule of regular examinations is right for you. This is a good chance for you to check in with your provider about disease prevention and staying healthy. In between checkups, there are plenty of things you can do on your own. Experts have done a lot of research about which lifestyle changes and preventive measures are most likely to keep you healthy. Ask your health care provider for more information. WEIGHT AND DIET  Eat a healthy diet  Be sure to include plenty of vegetables, fruits, low-fat dairy products, and lean protein.  Do not eat a lot of foods high in solid fats, added sugars, or salt.  Get regular exercise. This is one of the most important things you can do for your health.  Most adults should exercise for at least 150 minutes each week. The exercise should increase your heart rate and make you sweat (moderate-intensity exercise).  Most adults should also do strengthening exercises at least twice a week. This is in addition to the moderate-intensity exercise.  Maintain a healthy weight  Body mass index (BMI) is a measurement that can be used to identify possible weight problems. It estimates body fat based on height and weight. Your health care provider can help determine your BMI and help you achieve or maintain a healthy weight.  For females 74 years of age and older:   A BMI below 18.5 is considered underweight.  A BMI of 18.5 to 24.9 is normal.  A BMI of 25 to 29.9 is considered  overweight.  A BMI of 30 and above is considered obese.  Watch levels of cholesterol and blood lipids  You should start having your blood tested for lipids and cholesterol at 70 years of age, then have this test every 5 years.  You may need to have your cholesterol levels checked more often if:  Your lipid or cholesterol levels are high.  You are older than 70 years of age.  You are at high risk for heart disease.  CANCER SCREENING   Lung Cancer  Lung cancer screening is recommended for adults 26-18 years old who are at high risk for lung cancer because of a history of smoking.  A yearly low-dose CT scan of the lungs is recommended for people who:  Currently smoke.  Have quit within the past 15 years.  Have at least a 30-pack-year history of smoking. A pack year is smoking an average of one pack of cigarettes a day for 1 year.  Yearly screening should continue until it has been 15 years since you quit.  Yearly screening should stop if you develop a health problem that would prevent you from having lung cancer treatment.  Breast Cancer  Practice breast self-awareness. This means understanding how your breasts normally appear and feel.  It also means doing regular breast self-exams. Let your health care provider know about any changes, no matter how small.  If you are in your 20s or 30s, you should have a clinical breast exam (CBE) by a  health care provider every 1-3 years as part of a regular health exam.  If you are 1 or older, have a CBE every year. Also consider having a breast X-ray (mammogram) every year.  If you have a family history of breast cancer, talk to your health care provider about genetic screening.  If you are at high risk for breast cancer, talk to your health care provider about having an MRI and a mammogram every year.  Breast cancer gene (BRCA) assessment is recommended for women who have family members with BRCA-related cancers. BRCA-related  cancers include:  Breast.  Ovarian.  Tubal.  Peritoneal cancers.  Results of the assessment will determine the need for genetic counseling and BRCA1 and BRCA2 testing. Cervical Cancer Your health care provider may recommend that you be screened regularly for cancer of the pelvic organs (ovaries, uterus, and vagina). This screening involves a pelvic examination, including checking for microscopic changes to the surface of your cervix (Pap test). You may be encouraged to have this screening done every 3 years, beginning at age 25.  For women ages 17-65, health care providers may recommend pelvic exams and Pap testing every 3 years, or they may recommend the Pap and pelvic exam, combined with testing for human papilloma virus (HPV), every 5 years. Some types of HPV increase your risk of cervical cancer. Testing for HPV may also be done on women of any age with unclear Pap test results.  Other health care providers may not recommend any screening for nonpregnant women who are considered low risk for pelvic cancer and who do not have symptoms. Ask your health care provider if a screening pelvic exam is right for you.  If you have had past treatment for cervical cancer or a condition that could lead to cancer, you need Pap tests and screening for cancer for at least 20 years after your treatment. If Pap tests have been discontinued, your risk factors (such as having a new sexual partner) need to be reassessed to determine if screening should resume. Some women have medical problems that increase the chance of getting cervical cancer. In these cases, your health care provider may recommend more frequent screening and Pap tests. Colorectal Cancer  This type of cancer can be detected and often prevented.  Routine colorectal cancer screening usually begins at 70 years of age and continues through 70 years of age.  Your health care provider may recommend screening at an earlier age if you have risk  factors for colon cancer.  Your health care provider may also recommend using home test kits to check for hidden blood in the stool.  A small camera at the end of a tube can be used to examine your colon directly (sigmoidoscopy or colonoscopy). This is done to check for the earliest forms of colorectal cancer.  Routine screening usually begins at age 43.  Direct examination of the colon should be repeated every 5-10 years through 70 years of age. However, you may need to be screened more often if early forms of precancerous polyps or small growths are found. Skin Cancer  Check your skin from head to toe regularly.  Tell your health care provider about any new moles or changes in moles, especially if there is a change in a mole's shape or color.  Also tell your health care provider if you have a mole that is larger than the size of a pencil eraser.  Always use sunscreen. Apply sunscreen liberally and repeatedly throughout the day.  Protect yourself by wearing long sleeves, pants, a wide-brimmed hat, and sunglasses whenever you are outside. HEART DISEASE, DIABETES, AND HIGH BLOOD PRESSURE   High blood pressure causes heart disease and increases the risk of stroke. High blood pressure is more likely to develop in:  People who have blood pressure in the high end of the normal range (130-139/85-89 mm Hg).  People who are overweight or obese.  People who are African American.  If you are 29-4 years of age, have your blood pressure checked every 3-5 years. If you are 42 years of age or older, have your blood pressure checked every year. You should have your blood pressure measured twice--once when you are at a hospital or clinic, and once when you are not at a hospital or clinic. Record the average of the two measurements. To check your blood pressure when you are not at a hospital or clinic, you can use:  An automated blood pressure machine at a pharmacy.  A home blood pressure  monitor.  If you are between 27 years and 61 years old, ask your health care provider if you should take aspirin to prevent strokes.  Have regular diabetes screenings. This involves taking a blood sample to check your fasting blood sugar level.  If you are at a normal weight and have a low risk for diabetes, have this test once every three years after 70 years of age.  If you are overweight and have a high risk for diabetes, consider being tested at a younger age or more often. PREVENTING INFECTION  Hepatitis B  If you have a higher risk for hepatitis B, you should be screened for this virus. You are considered at high risk for hepatitis B if:  You were born in a country where hepatitis B is common. Ask your health care provider which countries are considered high risk.  Your parents were born in a high-risk country, and you have not been immunized against hepatitis B (hepatitis B vaccine).  You have HIV or AIDS.  You use needles to inject street drugs.  You live with someone who has hepatitis B.  You have had sex with someone who has hepatitis B.  You get hemodialysis treatment.  You take certain medicines for conditions, including cancer, organ transplantation, and autoimmune conditions. Hepatitis C  Blood testing is recommended for:  Everyone born from [birth year] through 1966.  Anyone with known risk factors for hepatitis C. Sexually transmitted infections (STIs)  You should be screened for sexually transmitted infections (STIs) including gonorrhea and chlamydia if:  You are sexually active and are younger than 70 years of age.  You are older than 70 years of age and your health care provider tells you that you are at risk for this type of infection.  Your sexual activity has changed since you were last screened and you are at an increased risk for chlamydia or gonorrhea. Ask your health care provider if you are at risk.  If you do not have HIV, but are at risk, it may be  recommended that you take a prescription medicine daily to prevent HIV infection. This is called pre-exposure prophylaxis (PrEP). You are considered at risk if:  You are sexually active and do not regularly use condoms or know the HIV status of your partner(s).  You take drugs by injection.  You are sexually active with a partner who has HIV. Talk with your health care provider about whether you are at high risk of being infected  with HIV. If you choose to begin PrEP, you should first be tested for HIV. You should then be tested every 3 months for as long as you are taking PrEP.  PREGNANCY   If you are premenopausal and you may become pregnant, ask your health care provider about preconception counseling.  If you may become pregnant, take 400 to 800 micrograms (mcg) of folic acid every day.  If you want to prevent pregnancy, talk to your health care provider about birth control (contraception). OSTEOPOROSIS AND MENOPAUSE   Osteoporosis is a disease in which the bones lose minerals and strength with aging. This can result in serious bone fractures. Your risk for osteoporosis can be identified using a bone density scan.  If you are 30 years of age or older, or if you are at risk for osteoporosis and fractures, ask your health care provider if you should be screened.  Ask your health care provider whether you should take a calcium or vitamin D supplement to lower your risk for osteoporosis.  Menopause may have certain physical symptoms and risks.  Hormone replacement therapy may reduce some of these symptoms and risks. Talk to your health care provider about whether hormone replacement therapy is right for you.  HOME CARE INSTRUCTIONS   Schedule regular health, dental, and eye exams.  Stay current with your immunizations.   Do not use any tobacco products including cigarettes, chewing tobacco, or electronic cigarettes.  If you are pregnant, do not drink alcohol.  If you are  breastfeeding, limit how much and how often you drink alcohol.  Limit alcohol intake to no more than 1 drink per day for nonpregnant women. One drink equals 12 ounces of beer, 5 ounces of wine, or 1 ounces of hard liquor.  Do not use street drugs.  Do not share needles.  Ask your health care provider for help if you need support or information about quitting drugs.  Tell your health care provider if you often feel depressed.  Tell your health care provider if you have ever been abused or do not feel safe at home.   This information is not intended to replace advice given to you by your health care provider. Make sure you discuss any questions you have with your health care provider.   Document Released: 02/24/2011 Document Revised: 09/01/2014 Document Reviewed: 07/13/2013 Elsevier Interactive Patient Education [employer].

## 2015-08-02 NOTE — Progress Notes (Signed)
Subjective:   Doris [name] is a 69 y.o. female who presents for an Initial Medicare Annual Wellness Visit.  Review of Systems    No ROS.  Medicare Wellness Visit.  Cardiac Risk Factors include: advanced age (>6055men, 14>65 women);hypertension     Objective:    Today's Vitals   08/02/15 1308  BP: 128/74  Pulse: 72  Temp: 97.4 F (36.3 C)  TempSrc: Oral  Resp: 14  Height: 5\' 3"  (1.6 m)  Weight: 151 lb (68.493 kg)  SpO2: 99%    Current Medications (verified) Outpatient Encounter Prescriptions as of 08/02/2015  Medication Sig  . acetaminophen (TYLENOL) 325 MG tablet Take 162.5 mg by mouth every 6 (six) hours as needed for headache.  Marland Kitchen. amLODipine (NORVASC) 5 MG tablet TAKE 1 TABLET BY MOUTH EVERY DAY  . losartan-hydrochlorothiazide (HYZAAR) 100-25 MG per tablet Take 0.5 tablets by mouth 2 (two) times daily.  . methimazole (TAPAZOLE) 5 MG tablet Take 1 tablet (5 mg total) by mouth 2 (two) times daily.  . Multiple Vitamin (MULTIVITAMIN WITH MINERALS) TABS tablet Take 1 tablet by mouth daily.   No facility-administered encounter medications on file as of 08/02/2015.    Allergies (verified) Review of patient's allergies indicates no known allergies.   History: Past Medical History  Diagnosis Date  . Hypertension   . Thyroid disease   . Positive TB test    Past Surgical History  Procedure Laterality Date  . Cesarean section     Family History  Problem Relation Age of Onset  . Cancer Mother     Breast Cancer  . Cancer Father     throat Cancer  . Thyroid cancer Daughter    Social History   Occupational History  . Not on file.   Social History Main Topics  . Smoking status: Never Smoker   . Smokeless tobacco: Never Used  . Alcohol Use: No  . Drug Use: No  . Sexual Activity: Yes    Tobacco Counseling Counseling given: Not Answered   Activities of Daily Living In your present state of health, do you have any difficulty performing the following activities:  08/02/2015  Hearing? N  Vision? N  Difficulty concentrating or making decisions? N  Walking or climbing stairs? N  Dressing or bathing? N  Doing errands, shopping? N  Preparing Food and eating ? N  Using the Toilet? N  In the past six months, have you accidently leaked urine? N  Do you have problems with loss of bowel control? N  Managing your Medications? N  Managing your Finances? N  Housekeeping or managing your Housekeeping? N    Immunizations and Health Maintenance Immunization History  Administered Date(s) Administered  . Influenza,inj,Quad PF,36+ Mos 06/07/2015  . Pneumococcal Conjugate-13 05/15/2015  . Pneumococcal Polysaccharide-23 01/26/2014  . Pneumococcal-Unspecified 05/15/2015  . Td 10/02/2010  . Tdap 10/02/2010   There are no preventive care reminders to display for this patient.  Patient Care Team: Carollee Leitzarrie M Doss, NP as PCP - General (Nurse Practitioner)  Indicate any recent Medical Services you may have received from other than [facility] providers in the past year (date may be approximate).     Assessment:   This is a routine wellness examination for Doris [name].  The goal of the wellness visit is to assist the patient how to close the gaps in care and create a preventative care plan for the patient.   Bone Density/Risk for Osteoporosis discussed/reviewed.  Taking meds without issues; no barriers identified.  Hep C Screening and ZOSTAVAX vaccine postponed, per patient.   Safety issues reviewed; smoke detectors in the home. No firearms in the home. Wears seatbelts when driving or riding with others. No violence in the home.  The patient was oriented x 3; appropriate in dress and manner and no objective failures at ADL's or IADL's.   Patient Concerns: None at this time. Follow up with PCP as needed.  Hearing/Vision screen Hearing Screening Comments: Passes the whisper test Vision Screening Comments: Followed by [facility] Last OV 01/2015 Annual  visits Wears glasses  Dietary issues and exercise activities discussed: Current Exercise Habits:: Home exercise routine, Type of exercise: calisthenics;walking, Time (Minutes): 30, Frequency (Times/Week): 3, Weekly Exercise (Minutes/Week): 90, Intensity: Moderate  Goals    . Increase physical activity     Start yoga routine at home daily as tolerated.      Depression Screen PHQ 2/9 Scores 08/02/2015  PHQ - 2 Score 0    Fall Risk Fall Risk  08/02/2015  Falls in the past year? No    Cognitive Function: MMSE - Mini Mental State Exam 08/02/2015  Orientation to time 5  Orientation to Place 5  Registration 3  Attention/ Calculation 5  Recall 3  Language- name 2 objects 2  Language- repeat 1  Language- follow 3 step command 3  Language- read & follow direction 1  Write a sentence 1  Copy design 1  Total score 30    Screening Tests Health Maintenance  Topic Date Due  . COLONOSCOPY  07/24/2016 (Originally 04/17/1995)  . ZOSTAVAX  08/01/2016 (Originally 04/16/2005)  . Hepatitis C Screening  08/01/2016 (Originally May 07, 1946)  . INFLUENZA VACCINE  03/25/2016  . PNA vac Low Risk Adult (2 of 2 - PCV13) 05/14/2016  . MAMMOGRAM  06/17/2017  . TETANUS/TDAP  10/02/2020  . DEXA SCAN  Completed      Plan:   End of life planning was discussed; aging in home or other; Advanced directives; Currently does not have completed forms.  Information declined.  Encouraged to contact PCP when ready to request forms for HCPOA/Living Will.   Follow up with PCP as needed.  During the course of the visit, Doris [name] was educated and counseled about the following appropriate screening and preventive services:   Vaccines to include Pneumoccal, Influenza, Hepatitis B, Td, Zostavax, HCV  Electrocardiogram  Cardiovascular disease screening  Colorectal cancer screening  Bone density screening  Diabetes screening  Glaucoma screening  Mammography/PAP  Nutrition counseling  Smoking cessation  counseling  Patient Instructions (the written plan) were given to the patient.    Ashok Pall, LPN   17/01/1606

## 2015-08-03 ENCOUNTER — Ambulatory Visit: Payer: Medicare Other

## 2015-08-06 ENCOUNTER — Other Ambulatory Visit: Payer: Self-pay | Admitting: Internal Medicine

## 2015-08-06 NOTE — Progress Notes (Signed)
I agree with the note and we will discuss and finish the remainder of these health maintenance concerns at her next office visit.   Naomie DeanCarrie Mohamadou Maciver, NP-C 08/06/2015 [phone]

## 2015-08-10 ENCOUNTER — Telehealth: Payer: Self-pay | Admitting: Internal Medicine

## 2015-08-10 NOTE — Telephone Encounter (Signed)
I apologize. Her methimazole.

## 2015-08-10 NOTE — Telephone Encounter (Signed)
Patient ask you to give her a call, didn't give me a reason

## 2015-08-10 NOTE — Telephone Encounter (Signed)
Let's have her back for thyroid tests first. Labs are in.

## 2015-08-10 NOTE — Telephone Encounter (Signed)
Please clarify what med.

## 2015-08-10 NOTE — Telephone Encounter (Signed)
Returned pt's call. Pt stated that she has been on the 5 mg 2x qd for approx a month now. She has a couple of symptoms that she had before, a weak stomach and at times heart palpitations. Pt wants to know if Dr Elvera LennoxGherghe wants to make any changes. Please advise.

## 2015-08-10 NOTE — Telephone Encounter (Signed)
Called pt and advised her per Dr Charlean SanfilippoGherghe's message. Pt scheduled lab appt for Monday.

## 2015-08-13 ENCOUNTER — Other Ambulatory Visit (INDEPENDENT_AMBULATORY_CARE_PROVIDER_SITE_OTHER): Payer: Medicare Other

## 2015-08-13 DIAGNOSIS — E0590 Thyrotoxicosis, unspecified without thyrotoxic crisis or storm: Secondary | ICD-10-CM | POA: Diagnosis not present

## 2015-08-13 LAB — T4, FREE: Free T4: 0.47 ng/dL — ABNORMAL LOW (ref 0.60–1.60)

## 2015-08-13 LAB — T3, FREE: T3 FREE: 2.9 pg/mL (ref 2.3–4.2)

## 2015-08-13 LAB — TSH: TSH: 27.03 u[IU]/mL — ABNORMAL HIGH (ref 0.35–4.50)

## 2015-08-22 ENCOUNTER — Encounter: Payer: Self-pay | Admitting: Nurse Practitioner

## 2015-08-22 ENCOUNTER — Ambulatory Visit (INDEPENDENT_AMBULATORY_CARE_PROVIDER_SITE_OTHER): Payer: Medicare Other | Admitting: Nurse Practitioner

## 2015-08-22 ENCOUNTER — Telehealth: Payer: Self-pay | Admitting: *Deleted

## 2015-08-22 VITALS — BP 122/70 | HR 78 | Temp 97.0°F | Resp 14 | Ht 63.0 in | Wt 151.2 lb

## 2015-08-22 DIAGNOSIS — E0590 Thyrotoxicosis, unspecified without thyrotoxic crisis or storm: Secondary | ICD-10-CM

## 2015-08-22 NOTE — Telephone Encounter (Signed)
S/w pt who states she saw her PCP today to review medications since methimazole was recently d/c'd. States she is clear on all of her medications at this time, is appreciative of the call and states she has no further questions at this time.

## 2015-08-22 NOTE — Progress Notes (Signed)
Pre visit review using our clinic review tool, if applicable. No additional management support is needed unless otherwise documented below in the visit note. 

## 2015-08-22 NOTE — Progress Notes (Signed)
Patient ID: Doris [name], female    DOB: 1945-09-23  Age: 69 y.o. MRN: [id number]  CC: Medication Management and Spasms   HPI Doris DecampJanie T Ramone presents for CC of medication management.  1) Taken off the Methimazole- Labs were done on 08/13/15 by Dr. Elvera LennoxGherghe and she was to repeat labs in 4-5 weeks.   2) Back spasm- concerned is this from stopping the methimazole  3) Jaws hurt on both sides- spasm and went away in 5 minutes, hasn't happened again   History Doris [name] has a past medical history of Hypertension; Thyroid disease; and Positive TB test.   She has past surgical history that includes Cesarean section.   Her family history includes Cancer in her father and mother; Thyroid cancer in her daughter.She reports that she has never smoked. She has never used smokeless tobacco. She reports that she does not drink alcohol or use illicit drugs.  Outpatient Prescriptions Prior to Visit  Medication Sig Dispense Refill  . acetaminophen (TYLENOL) 325 MG tablet Take 162.5 mg by mouth every 6 (six) hours as needed for headache.    Marland Kitchen. amLODipine (NORVASC) 5 MG tablet TAKE 1 TABLET BY MOUTH EVERY DAY 30 tablet 3  . losartan-hydrochlorothiazide (HYZAAR) 100-25 MG per tablet Take 0.5 tablets by mouth 2 (two) times daily.    . Multiple Vitamin (MULTIVITAMIN WITH MINERALS) TABS tablet Take 1 tablet by mouth daily.    . methimazole (TAPAZOLE) 5 MG tablet Take 1 tablet (5 mg total) by mouth 2 (two) times daily. 60 tablet 1   No facility-administered medications prior to visit.    ROS Review of Systems  Constitutional: Negative for fever, chills, diaphoresis and fatigue.  HENT: Negative for facial swelling.   Musculoskeletal: Positive for myalgias and arthralgias.  Psychiatric/Behavioral: The patient is nervous/anxious.     Objective:  BP 122/70 mmHg  Pulse 78  Temp(Src) 97 F (36.1 C) (Oral)  Resp 14  Ht 5\' 3"  (1.6 m)  Wt 151 lb 3.2 oz (68.584 kg)  BMI 26.79 kg/m2  SpO2 98%  Physical Exam   Constitutional: She is oriented to person, place, and time. She appears well-developed and well-nourished. No distress.  HENT:  Head: Normocephalic and atraumatic.  Right Ear: External ear normal.  Left Ear: External ear normal.  Cardiovascular: Normal rate and regular rhythm.   Pulmonary/Chest: Effort normal and breath sounds normal. No respiratory distress. She has no wheezes. She has no rales. She exhibits no tenderness.  Neurological: She is alert and oriented to person, place, and time. No cranial nerve deficit. She exhibits normal muscle tone. Coordination normal.  Skin: Skin is warm and dry. No rash noted. She is not diaphoretic.  Psychiatric: She has a normal mood and affect. Her behavior is normal. Judgment and thought content normal.   Assessment & Plan:   Doris [name] was seen today for medication management and spasms.  Diagnoses and all orders for this visit:  Subclinical hyperthyroidism  I have discontinued Ms. Ewell's methimazole. I am also having her maintain her losartan-hydrochlorothiazide, multivitamin with minerals, acetaminophen, and amLODipine.  No orders of the defined types were placed in this encounter.     Follow-up: Return if symptoms worsen or fail to improve.

## 2015-08-22 NOTE — Telephone Encounter (Signed)
Patient was diagnosed with Graves disease recently and has some concerns with her heart condition and medications. Please call patient.

## 2015-08-30 NOTE — Assessment & Plan Note (Signed)
Patient is followed by endocrinology She was reassured that her symptoms do not seem related and since she is well-appearing today to let me know if anything happens in the future. She is agreeable to this will follow

## 2015-09-11 ENCOUNTER — Other Ambulatory Visit: Payer: Medicare Other

## 2015-09-12 ENCOUNTER — Other Ambulatory Visit (INDEPENDENT_AMBULATORY_CARE_PROVIDER_SITE_OTHER): Payer: Medicare Other

## 2015-09-12 ENCOUNTER — Other Ambulatory Visit: Payer: Medicare Other

## 2015-09-12 ENCOUNTER — Other Ambulatory Visit: Payer: Self-pay | Admitting: *Deleted

## 2015-09-12 DIAGNOSIS — E0590 Thyrotoxicosis, unspecified without thyrotoxic crisis or storm: Secondary | ICD-10-CM

## 2015-09-12 LAB — T3, FREE: T3, Free: 3.1 pg/mL (ref 2.3–4.2)

## 2015-09-12 LAB — TSH: TSH: 1.82 u[IU]/mL (ref 0.35–4.50)

## 2015-09-12 LAB — T4, FREE: Free T4: 0.77 ng/dL (ref 0.60–1.60)

## 2015-09-13 ENCOUNTER — Telehealth: Payer: Self-pay | Admitting: Internal Medicine

## 2015-09-13 ENCOUNTER — Encounter: Payer: Self-pay | Admitting: *Deleted

## 2015-09-13 NOTE — Telephone Encounter (Signed)
Patient is calling for lab results.

## 2015-09-13 NOTE — Telephone Encounter (Signed)
Called pt and advised her per Dr Charlean Sanfilippo result note. Pt voiced understanding and asked for results to be mailed to her.

## 2015-10-09 ENCOUNTER — Other Ambulatory Visit: Payer: Self-pay | Admitting: Cardiovascular Disease

## 2015-11-14 ENCOUNTER — Telehealth: Payer: Self-pay | Admitting: Internal Medicine

## 2015-11-14 DIAGNOSIS — E0590 Thyrotoxicosis, unspecified without thyrotoxic crisis or storm: Secondary | ICD-10-CM

## 2015-11-14 NOTE — Telephone Encounter (Signed)
Returned pt's call. Pt stated that she is feeling very sluggish for over a week. She has been off the thyroid meds since Jan. Pt wants to do labs to see if there are any changes in her levels. Pt to come tomorrow for labs. Be advised.

## 2015-11-14 NOTE — Telephone Encounter (Signed)
Pt said she had a question for [facility] and requests you call her back either on cell or home

## 2015-11-15 ENCOUNTER — Other Ambulatory Visit (INDEPENDENT_AMBULATORY_CARE_PROVIDER_SITE_OTHER): Payer: Medicare Other

## 2015-11-15 DIAGNOSIS — E0590 Thyrotoxicosis, unspecified without thyrotoxic crisis or storm: Secondary | ICD-10-CM | POA: Diagnosis not present

## 2015-11-15 LAB — TSH: TSH: 0.04 u[IU]/mL — ABNORMAL LOW (ref 0.35–4.50)

## 2015-11-15 LAB — T3, FREE: T3, Free: 4.8 pg/mL — ABNORMAL HIGH (ref 2.3–4.2)

## 2015-11-15 LAB — T4, FREE: FREE T4: 1.84 ng/dL — AB (ref 0.60–1.60)

## 2015-11-16 ENCOUNTER — Telehealth: Payer: Self-pay | Admitting: Internal Medicine

## 2015-11-16 NOTE — Telephone Encounter (Signed)
Have you received lab results?

## 2015-11-16 NOTE — Telephone Encounter (Signed)
Called pt and advised her of her lab results.  

## 2015-11-19 ENCOUNTER — Telehealth: Payer: Self-pay | Admitting: Internal Medicine

## 2015-11-19 NOTE — Telephone Encounter (Signed)
Mailed pts lab results

## 2015-11-19 NOTE — Telephone Encounter (Signed)
Pt requests that you mail her recent lab results to her, she said she usually has them sent to her?

## 2015-12-10 ENCOUNTER — Telehealth: Payer: Self-pay | Admitting: Nurse Practitioner

## 2015-12-10 NOTE — Telephone Encounter (Signed)
Patient Name: Doris [name] DOB: Aug 22, 1946 InitiaRica Mastl Comment Caller states developed a stomach virus on Wed. Is there anything she can do. Has loose stool, it is mushy. No fever or vomiting. Nurse Assessment Nurse: Elijah Birkaldwell, RN, Lynda Date/Time (Eastern Time): 12/10/2015 9:32:36 AM Confirm and document reason for call. If symptomatic, describe symptoms. You must click the next button to save text entered. ---Caller states she developed a stomach virus on Wed. Is there anything she can do? Has loose stool, it is mushy. No fever or vomiting. Took Kaopectate Sat. It stopped yesterday, then started again today. Has the patient traveled out of the country within the last 30 days? ---Not Applicable Does the patient have any new or worsening symptoms? ---Yes Will a triage be completed? ---Yes Related visit to physician within the last 2 weeks? ---No Does the PT have any chronic conditions? (i.e. diabetes, asthma, etc.) ---Yes List chronic conditions. ---Grave's disease, BP rx Is this a behavioral health or substance abuse call? ---No Guidelines Guideline Title Affirmed Question Affirmed Notes Diarrhea [1] MODERATE diarrhea (e.g., 4-6 times / day more than normal) AND [2] present > 48 hours (2 days) Final Disposition User See Physician within 24 Hours Poundaldwell, RN, ToysRusLynda Referrals REFERRED TO PCP OFFICE Disagree/Comply: Danella Maiersomply

## 2015-12-10 NOTE — Telephone Encounter (Signed)
Patient has an appt with you on 4/19. thanks

## 2015-12-10 NOTE — Telephone Encounter (Signed)
error 

## 2015-12-12 ENCOUNTER — Ambulatory Visit: Payer: Self-pay | Admitting: Nurse Practitioner

## 2015-12-12 DIAGNOSIS — Z0289 Encounter for other administrative examinations: Secondary | ICD-10-CM

## 2015-12-13 ENCOUNTER — Ambulatory Visit (INDEPENDENT_AMBULATORY_CARE_PROVIDER_SITE_OTHER): Payer: Medicare Other | Admitting: Family Medicine

## 2015-12-13 ENCOUNTER — Encounter: Payer: Self-pay | Admitting: Family Medicine

## 2015-12-13 VITALS — BP 126/86 | HR 77 | Temp 98.5°F | Ht 63.0 in | Wt 148.0 lb

## 2015-12-13 DIAGNOSIS — R197 Diarrhea, unspecified: Secondary | ICD-10-CM

## 2015-12-13 NOTE — Assessment & Plan Note (Signed)
New problem. Currently improving. No indication for further evaluation or workup. No indication for treatment at this time. Patient to follow-up if recurs.

## 2015-12-13 NOTE — Progress Notes (Signed)
   Subjective:  Patient ID: Doris [name], female    DOB: 11/24/1945  Age: 70 y.o. MRN: [id number]  CC: Diarrhea  HPI:  70 year old female presents with complaints of diarrhea.  Diarrhea  She reports that she's had loose stools for the past week.   No associated abdominal pain, nausea, vomiting, fever, chills.  No changes in diet.  No recent antibodies.  Patient states she took Pepto-Bismol recently.  She states that it appears to be improving as she had a formed stool this morning. She just wanted to be evaluated and make sure everything was okay.   No other complaints today.  Social Hx   Social History   Social History  . Marital Status: Married    Spouse Name: N/A  . Number of Children: N/A  . Years of Education: N/A   Social History Main Topics  . Smoking status: Never Smoker   . Smokeless tobacco: Never Used  . Alcohol Use: No  . Drug Use: No  . Sexual Activity: Yes   Other Topics Concern  . None   Social History Narrative   Lives with husband in a 2 story home.  Has 5 children. (1 set of twins)   Retired Architectural technologistteacher's assistant.     Education: some college.    Review of Systems  Constitutional: Negative.   Gastrointestinal: Positive for diarrhea. Negative for nausea, vomiting and abdominal pain.   Objective:  BP 126/86 mmHg  Pulse 77  Temp(Src) 98.5 F (36.9 C) (Oral)  Ht 5\' 3"  (1.6 m)  Wt 148 lb (67.132 kg)  BMI 26.22 kg/m2  SpO2 98%  BP/Weight 12/13/2015 08/22/2015 08/02/2015  Systolic BP 126 122 128  Diastolic BP 86 70 74  Wt. (Lbs) 148 151.2 151  BMI 26.22 26.79 26.76   Physical Exam  Constitutional: She is oriented to person, place, and time. She appears well-developed. No distress.  Cardiovascular: Normal rate and regular rhythm.   Pulmonary/Chest: Effort normal. She has no wheezes. She has no rales.  Abdominal: Soft. She exhibits no distension. There is no tenderness. There is no rebound and no guarding.  Neurological: She is alert and  oriented to person, place, and time.  Psychiatric: She has a normal mood and affect.  Vitals reviewed.  Lab Results  Component Value Date   WBC 7.3 06/07/2015   HGB 13.8 06/07/2015   HCT 40.7 06/07/2015   PLT 217.0 06/07/2015   GLUCOSE 96 06/07/2015   LDLDIRECT 74.0 06/07/2015   ALT 14 06/07/2015   AST 17 06/07/2015   NA 137 06/07/2015   K 4.0 06/07/2015   CL 98 06/07/2015   CREATININE 0.90 06/07/2015   BUN 26* 06/07/2015   CO2 31 06/07/2015   TSH 0.04* 11/15/2015   HGBA1C 5.5 06/07/2015   Assessment & Plan:   Problem List Items Addressed This Visit    Diarrhea - Primary    New problem. Currently improving. No indication for further evaluation or workup. No indication for treatment at this time. Patient to follow-up if recurs.        Follow-up: PRN  Everlene OtherJayce Juliano Mceachin DO [facility] [place]

## 2015-12-13 NOTE — Patient Instructions (Signed)
Let us know if it recurs/persists.  Follow up as needed.  Take care  Dr. Adriana Simasook    Diarrhea Diarrhea is frequent loose and watery bowel movements. It can cause you to feel weak and dehydrated. Dehydration can cause you to become tired and thirsty, have a dry mouth, and have decreased urination that often is dark yellow. Diarrhea is a sign of another problem, most often an infection that will not last long. In most cases, diarrhea typically lasts 2-3 days. However, it can last longer if it is a sign of something more serious. It is important to treat your diarrhea as directed by your caregiver to lessen or prevent future episodes of diarrhea. CAUSES  Some common causes include:  Gastrointestinal infections caused by viruses, bacteria, or parasites.  Food poisoning or food allergies.  Certain medicines, such as antibiotics, chemotherapy, and laxatives.  Artificial sweeteners and fructose.  Digestive disorders. HOME CARE INSTRUCTIONS  Ensure adequate fluid intake (hydration): Have 1 cup (8 oz) of fluid for each diarrhea episode. Avoid fluids that contain simple sugars or sports drinks, fruit juices, whole milk products, and sodas. Your urine should be clear or pale yellow if you are drinking enough fluids. Hydrate with an oral rehydration solution that you can purchase at pharmacies, retail stores, and online. You can prepare an oral rehydration solution at home by mixing the following ingredients together:   - tsp table salt.   tsp baking soda.   tsp salt substitute containing potassium chloride.  1  tablespoons sugar.  1 L (34 oz) of water.  Certain foods and beverages may increase the speed at which food moves through the gastrointestinal (GI) tract. These foods and beverages should be avoided and include:  Caffeinated and alcoholic beverages.  High-fiber foods, such as raw fruits and vegetables, nuts, seeds, and whole grain breads and cereals.  Foods and beverages sweetened  with sugar alcohols, such as xylitol, sorbitol, and mannitol.  Some foods may be well tolerated and may help thicken stool including:  Starchy foods, such as rice, toast, pasta, low-sugar cereal, oatmeal, grits, baked potatoes, crackers, and bagels.  Bananas.  Applesauce.  Add probiotic-rich foods to help increase healthy bacteria in the GI tract, such as yogurt and fermented milk products.  Wash your hands well after each diarrhea episode.  Only take over-the-counter or prescription medicines as directed by your caregiver.  Take a warm bath to relieve any burning or pain from frequent diarrhea episodes. SEEK IMMEDIATE MEDICAL CARE IF:   You are unable to keep fluids down.  You have persistent vomiting.  You have blood in your stool, or your stools are black and tarry.  You do not urinate in 6-8 hours, or there is only a small amount of very dark urine.  You have abdominal pain that increases or localizes.  You have weakness, dizziness, confusion, or light-headedness.  You have a severe headache.  Your diarrhea gets worse or does not get better.  You have a fever or persistent symptoms for more than 2-3 days.  You have a fever and your symptoms suddenly get worse. MAKE SURE YOU:   Understand these instructions.  Will watch your condition.  Will get help right away if you are not doing well or get worse.   This information is not intended to replace advice given to you by your health care provider. Make sure you discuss any questions you have with your health care provider.   Document Released: 08/01/2002 Document Revised: 09/01/2014 Document Reviewed:  04/18/2012 Elsevier Interactive Patient Education [employer].

## 2016-01-02 ENCOUNTER — Telehealth: Payer: Self-pay | Admitting: Nurse Practitioner

## 2016-01-02 NOTE — Telephone Encounter (Signed)
Unable to reach patient. No voicemail.

## 2016-01-02 NOTE — Telephone Encounter (Signed)
No, there is nothing in particular. Tell her I am sorry to be leaving and it was nice working with her. Whomever she decides to go with in the future will follow up and go through with labs.

## 2016-01-02 NOTE — Telephone Encounter (Signed)
Please advise 

## 2016-01-02 NOTE — Telephone Encounter (Signed)
Pt wanted to know if she need any labs done [facility] Please call pt at [phone].

## 2016-01-03 NOTE — Telephone Encounter (Signed)
Attempted to reach patient, no VM.

## 2016-01-11 ENCOUNTER — Encounter: Payer: Self-pay | Admitting: Internal Medicine

## 2016-01-11 ENCOUNTER — Ambulatory Visit (INDEPENDENT_AMBULATORY_CARE_PROVIDER_SITE_OTHER): Payer: Medicare Other | Admitting: Internal Medicine

## 2016-01-11 VITALS — BP 118/68 | HR 68 | Temp 97.9°F | Resp 12 | Wt 147.4 lb

## 2016-01-11 DIAGNOSIS — E0500 Thyrotoxicosis with diffuse goiter without thyrotoxic crisis or storm: Secondary | ICD-10-CM

## 2016-01-11 LAB — TSH: TSH: 4.4 u[IU]/mL (ref 0.35–4.50)

## 2016-01-11 LAB — T3, FREE: T3, Free: 2.7 pg/mL (ref 2.3–4.2)

## 2016-01-11 LAB — T4, FREE: Free T4: 0.62 ng/dL (ref 0.60–1.60)

## 2016-01-11 NOTE — Patient Instructions (Signed)
Please continue Methimazole 5 mg daily.  Please stop at the lab.  Please come back for a follow-up appointment in 6 months.  

## 2016-01-11 NOTE — Progress Notes (Signed)
Patient ID: Doris DecampJanie T [name], female   DOB: 05-Nov-1945, 70 y.o.   MRN: [id number]   HPI  Doris [name] is a 70 y.o.-year-old female, initially referred by her PCP, Tacey RuizEmily Dolleschel, NP (Dr Barron Alvineavid [facility]), now returning for f/u for Graves ds, dx after last visit. Last visit 8 mo ago.  Reviewed and addended hx: She had a gastroenteritis on 10/25/2014 >> felt weak after this. She presented to [facility] for the weakness - TSH found low mid-March. She then had the TSH rechecked at a visit with PCP: TSH still low.   She had weakness in the past with her thyrotoxic episodes, including last winter >> TFTs abnormal >> restarted MMI 5 mg daily in 10/2015.  I reviewed pt's thyroid tests: Lab Results  Component Value Date   TSH 0.04* 11/15/2015   TSH 1.82 09/12/2015   TSH 27.03* 08/13/2015   TSH 0.44 07/03/2015   TSH 0.08* 05/03/2015   FREET4 1.84* 11/15/2015   FREET4 0.77 09/12/2015   FREET4 0.47* 08/13/2015   FREET4 0.63 07/03/2015   FREET4 1.35 05/03/2015   11/21/2014: TSH 0.229 (0.450-4.5), TT4 11.9 (4.5-12)  11/04/2014: TSH 0.326 02/2014: TSH 2.530  05/29/2015: Thyroid uptake and scan: The 24 hour radioactive iodine uptake is equal to 40.9%. There is uniform tracer uptake throughout both lobes. No dominant hot or cold nodule identified. IMPRESSION: 1. Elevated 24 hour radioactive uptake equal 40.9%. 2. Patient's hyperthyroidism may be due to Grave's disease.  We started methimazole after the uptake and scan, and we adjusted the dose as her TFTs started to fluctuate. She is now on 5 mg of methimazole daily. No side effects.  Pt denies feeling nodules in neck, hoarseness, dysphagia/odynophagia, SOB with lying down; she c/o: - no fatigue - no excessive sweating/heat intolerance - no tremors - no anxiety - no palpitations - no hyperdefecation - no weight loss - no hair loss  Pt does have a FH of thyroid ds. - + FH of thyroid cancer in daughter. No h/o radiation tx  to head or neck.  No seaweed or kelp, no recent contrast studies. No steroid use. No herbal supplements. No Biotin use.  She also has a history of HTN.  ROS: Constitutional: no weight gain/loss, no fatigue, no subjective hyperthermia/hypothermia Eyes: no blurry vision, no xerophthalmia ENT: no sore throat, no nodules palpated in throat, no dysphagia/odynophagia, no hoarseness Cardiovascular: no CP/SOB/palpitations/leg swelling Respiratory: no cough/SOB Gastrointestinal: no N/V/D/C Musculoskeletal: no muscle/joint aches Skin: no rashes Neurological: no tremors/numbness/tingling/dizziness Psychiatric: no depression/anxiety  I reviewed pt's medications, allergies, PMH, social hx, family hx, and changes were documented in the history of present illness. Otherwise, unchanged from my initial visit note.  Past Medical History  Diagnosis Date  . Hypertension   . Thyroid disease   . Positive TB test    Past Surgical History  Procedure Laterality Date  . Cesarean section      History   Social History  . Marital Status: Married    Spouse Name: N/A  . Number of Children: 5   Occupational History  . Retired Runner, broadcasting/film/videoteacher   Social History Main Topics  . Smoking status: Never Smoker   . Smokeless tobacco: Not on file  . Alcohol Use: No  . Drug Use: No   Current Outpatient Prescriptions on File Prior to Visit  Medication Sig Dispense Refill  . acetaminophen (TYLENOL) 325 MG tablet Take 162.5 mg by mouth every 6 (six) hours as needed for headache.    .Marland Kitchen  amLODipine (NORVASC) 5 MG tablet TAKE 1 TABLET BY MOUTH EVERY DAY 30 tablet 3  . losartan-hydrochlorothiazide (HYZAAR) 100-25 MG per tablet Take 0.5 tablets by mouth 2 (two) times daily.    . Multiple Vitamin (MULTIVITAMIN WITH MINERALS) TABS tablet Take 1 tablet by mouth daily.     No current facility-administered medications on file prior to visit.   No Known Allergies   FH: - HTN in M - HL in M, sister - also see HPI  PE: BP  118/68 mmHg  Pulse 68  Temp(Src) 97.9 F (36.6 C) (Oral)  Resp 12  Wt 147 lb 6.4 oz (66.86 kg)  SpO2 98% Body mass index is 26.12 kg/(m^2). Wt Readings from Last 3 Encounters:  01/11/16 147 lb 6.4 oz (66.86 kg)  12/13/15 148 lb (67.132 kg)  08/22/15 151 lb 3.2 oz (68.584 kg)   Constitutional: slightly overweight, in NAD Eyes: PERRLA, EOMI, no exophthalmos, no lid lag, no stare ENT: moist mucous membranes, no thyromegaly, no cervical lymphadenopathy Cardiovascular: RRR, + 1/6 SEM, no RG Respiratory: CTA B Gastrointestinal: abdomen soft, NT, ND, BS+ Musculoskeletal: no deformities, strength intact in all 4 Skin: moist, warm, no rashes Neurological: no tremor with outstretched hands, DTR normal in all 4  ASSESSMENT: 1. Graves ds.   PLAN:  1. Patient with h/o Graves ds, dx'ed after last visit. She had fatigue/weakness that started after an episode of gastroenteritis in Spring 2016.  - reviewed the results of her Uptake and scan and explained the dx of Graves and the evolution of the ds. We discussed about possible tx options >> we decided to continue with MMI for now (started at last visit  - now taking 5 mg daily).  - will check the TSH, fT3 and fT4 today - I do not feel that we need to add beta blockers at this time, since she is not tachycardic, anxious, or tremulous - I advised her to join my chart to communicate easier >> refuses - RTC in 6 mo   Needs refills if we continue the same dose or higher.  Component     Latest Ref Rng 01/11/2016  TSH     0.35 - 4.50 uIU/mL 4.40  T4,Free(Direct)     0.60 - 1.60 ng/dL 1.61  Triiodothyronine,Free,Serum     2.3 - 4.2 pg/mL 2.7   TSH close to upper limit of normal, while free T4 and free T3 close to the lower limit of normal. We'll reduce the methimazole to 2.5 mg daily or 5 mg every other day and will recheck TFTs in 5-6 weeks.

## 2016-01-14 ENCOUNTER — Encounter: Payer: Self-pay | Admitting: Cardiovascular Disease

## 2016-01-14 ENCOUNTER — Ambulatory Visit (INDEPENDENT_AMBULATORY_CARE_PROVIDER_SITE_OTHER): Payer: Medicare Other | Admitting: Cardiovascular Disease

## 2016-01-14 ENCOUNTER — Encounter: Payer: Self-pay | Admitting: *Deleted

## 2016-01-14 VITALS — BP 148/78 | HR 70 | Ht 63.0 in | Wt 147.5 lb

## 2016-01-14 DIAGNOSIS — I10 Essential (primary) hypertension: Secondary | ICD-10-CM | POA: Diagnosis not present

## 2016-01-14 NOTE — Patient Instructions (Signed)
Medication Instructions: Continue same medications.   Labwork: None.   Procedures/Testing: None.   Follow-Up: 1 year with Dr. Arida.   Any Additional Special Instructions Will Be Listed Below (If Applicable).     If you need a refill on your cardiac medications before your next appointment, please call your pharmacy.   

## 2016-01-14 NOTE — Progress Notes (Signed)
Cardiology Office Note   Date:  01/14/2016   ID:  Doris [name] May 22, 1946, MRN [id number]  PCP:  Carollee Leitz, NP  Cardiologist:   Lorine Bears, MD   Chief Complaint  Patient presents with  . other    1 yr f/u. Meds reviewed verbally with pt.      History of Present Illness: Doris [name] is a 70 y.o. female who presents for  a followup visit regarding hypertension.  Blood pressure has been well controlled on amlodipine and losartan-hydrochlorothiazide. She monitors her blood pressure very carefully. She was diagnosed with Graves' disease and currently being treated with methimazole. Subsequence thyroid lab testing was normal. She denies any chest pain, shortness of breath or palpitations. No dizziness or syncope.  Past Medical History  Diagnosis Date  . Hypertension   . Thyroid disease   . Positive TB test     Past Surgical History  Procedure Laterality Date  . Cesarean section       Current Outpatient Prescriptions  Medication Sig Dispense Refill  . acetaminophen (TYLENOL) 325 MG tablet Take 162.5 mg by mouth every 6 (six) hours as needed for headache.    Marland Kitchen amLODipine (NORVASC) 5 MG tablet TAKE 1 TABLET BY MOUTH EVERY DAY 30 tablet 3  . losartan-hydrochlorothiazide (HYZAAR) 100-25 MG per tablet Take 0.5 tablets by mouth 2 (two) times daily.    . methimazole (TAPAZOLE) 5 MG tablet Take 5 mg by mouth daily.   1  . Multiple Vitamin (MULTIVITAMIN WITH MINERALS) TABS tablet Take 1 tablet by mouth daily.     No current facility-administered medications for this visit.    Allergies:   Review of patient's allergies indicates no known allergies.    Social History:  The patient  reports that she has never smoked. She has never used smokeless tobacco. She reports that she does not drink alcohol or use illicit drugs.   Family History:  The patient's family history includes Cancer in her father and mother; Thyroid cancer in her daughter.    ROS:  Please see  the history of present illness.   Otherwise, review of systems are positive for none.   All other systems are reviewed and negative.    PHYSICAL EXAM: VS:  BP 148/78 mmHg  Pulse 70  Ht  (1.6 m)  Wt 147 lb 8 oz (66.906 kg)  BMI 26.14 kg/m2 , BMI Body mass index is 26.14 kg/(m^2). GEN: Well nourished, well developed, in no acute distress HEENT: normal Neck: no JVD, carotid bruits, or masses Cardiac: RRR; no  rubs, or gallops,no edema . One out of 6 systolic ejection murmur in the pulmonic area which is early peaking. Respiratory:  clear to auscultation bilaterally, normal work of breathing GI: soft, nontender, nondistended, + BS MS: no deformity or atrophy Skin: warm and dry, no rash Neuro:  Strength and sensation are intact Psych: euthymic mood, full affect   EKG:  EKG is ordered today. The ekg ordered today demonstrates normal sinus rhythm with low voltage. No significant ST or T wave changes.   Recent Labs: 06/07/2015: ALT 14; BUN 26*; Creatinine, Ser 0.90; Hemoglobin 13.8; Platelets 217.0; Potassium 4.0; Sodium 137 01/11/2016: TSH 4.40    Lipid Panel    Component Value Date/Time   LDLDIRECT 74.0 06/07/2015 1057      Wt Readings from Last 3 Encounters:  01/14/16 147 lb 8 oz (66.906 kg)  01/11/16 147 lb 6.4 oz (66.86 kg)  12/13/15 148 lb (  67.132 kg)      ASSESSMENT AND PLAN:  1.  Essential hypertension: Blood pressure is well controlled on current medications with no side effects. Today's blood pressure is a little bit high but this is unusual for her. Home blood pressure readings are excellent.  2. Functional heart murmur: This does not require further investigation at the present time.     Disposition:   FU with me in 1 year  Signed,  Lorine BearsMuhammad Arida, MD  01/14/2016 11:02 AM    [facility]

## 2016-02-04 ENCOUNTER — Other Ambulatory Visit: Payer: Self-pay | Admitting: Cardiovascular Disease

## 2016-02-21 ENCOUNTER — Other Ambulatory Visit (INDEPENDENT_AMBULATORY_CARE_PROVIDER_SITE_OTHER): Payer: Medicare Other

## 2016-02-21 DIAGNOSIS — E0500 Thyrotoxicosis with diffuse goiter without thyrotoxic crisis or storm: Secondary | ICD-10-CM

## 2016-02-21 LAB — TSH: TSH: 2.24 u[IU]/mL (ref 0.35–4.50)

## 2016-02-21 LAB — T3, FREE: T3 FREE: 3.1 pg/mL (ref 2.3–4.2)

## 2016-02-21 LAB — T4, FREE: Free T4: 0.88 ng/dL (ref 0.60–1.60)

## 2016-02-22 ENCOUNTER — Telehealth: Payer: Self-pay

## 2016-02-22 NOTE — Telephone Encounter (Signed)
Called patient. Notified of normal results. Patient not sure of when MD wanted to see her again, in 3 months or 6 months. Patient wants lab results sent to her house with appointment date when made as well.

## 2016-02-22 NOTE — Telephone Encounter (Signed)
I called patient. Notified her of her November appointment, and mailed her the lab results and put the appointment on that letter as well. No questions or concerns.

## 2016-03-14 IMAGING — CR DG CHEST 2V
2 series · 2 of 2 positions shown · non-contrast
Comparison: None.

CLINICAL DATA: Weakness.

EXAM:
CHEST  2 VIEW

[w chest pa]
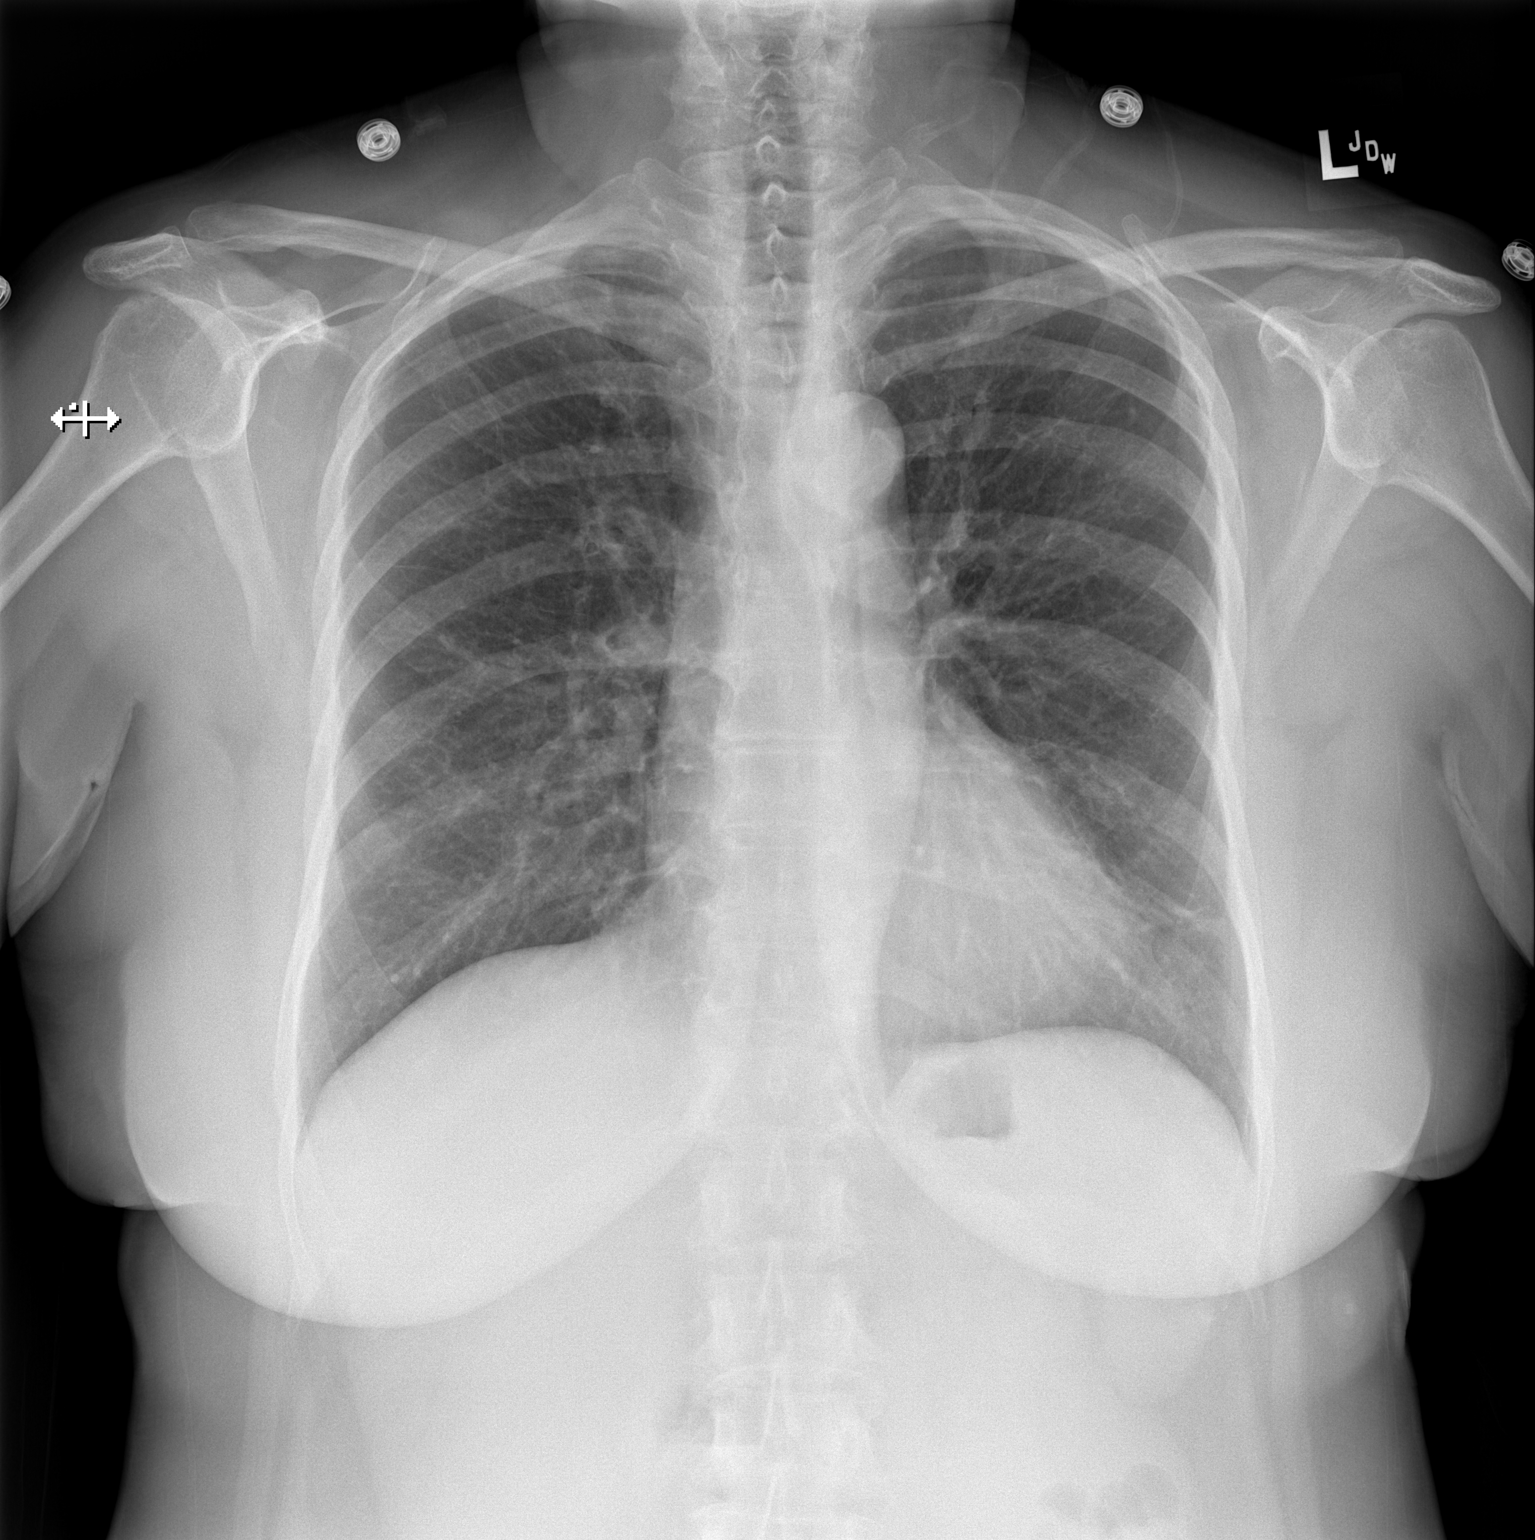

[w chest lat]
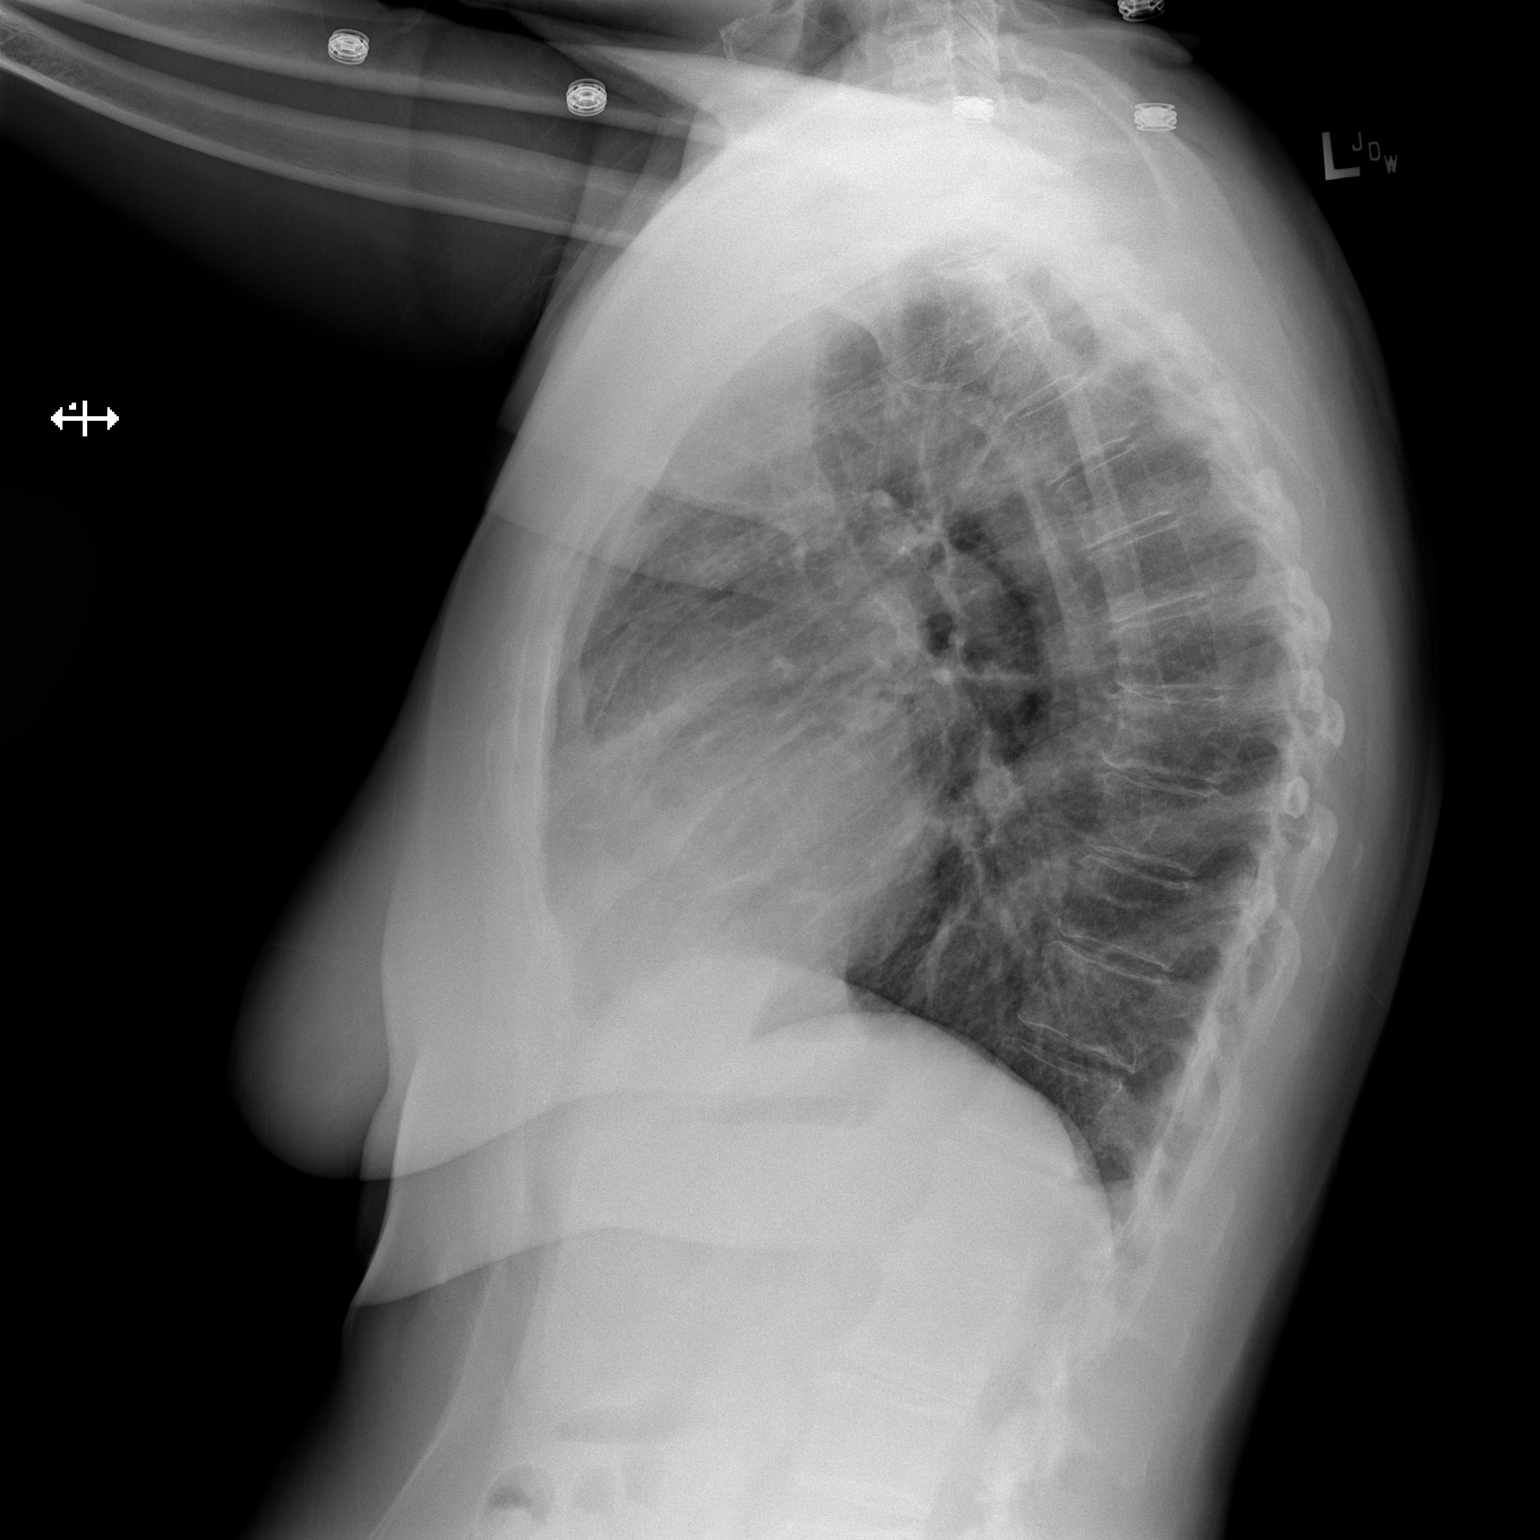

[2 of 2 positions shown; findings below may reference images not displayed]

FINDINGS: Heart size and pulmonary vascularity are normal and the lungs are
clear except for a tiny area of atelectasis or scarring at the left
lung base laterally. No effusions. No significant osseous
abnormality.
IMPRESSION: No significant abnormality.

## 2016-03-22 ENCOUNTER — Other Ambulatory Visit: Payer: Self-pay | Admitting: Cardiovascular Disease

## 2016-04-04 ENCOUNTER — Other Ambulatory Visit: Payer: Self-pay | Admitting: Internal Medicine

## 2016-06-02 ENCOUNTER — Other Ambulatory Visit: Payer: Self-pay | Admitting: Cardiovascular Disease

## 2016-07-14 ENCOUNTER — Ambulatory Visit (INDEPENDENT_AMBULATORY_CARE_PROVIDER_SITE_OTHER): Payer: Medicare Other | Admitting: Internal Medicine

## 2016-07-14 ENCOUNTER — Encounter: Payer: Self-pay | Admitting: Internal Medicine

## 2016-07-14 VITALS — BP 146/90 | HR 82 | Ht 62.75 in | Wt 155.6 lb

## 2016-07-14 DIAGNOSIS — E0500 Thyrotoxicosis with diffuse goiter without thyrotoxic crisis or storm: Secondary | ICD-10-CM

## 2016-07-14 LAB — T3, FREE: T3, Free: 3.1 pg/mL (ref 2.3–4.2)

## 2016-07-14 LAB — T4, FREE: Free T4: 0.85 ng/dL (ref 0.60–1.60)

## 2016-07-14 LAB — TSH: TSH: 1.3 u[IU]/mL (ref 0.35–4.50)

## 2016-07-14 NOTE — Patient Instructions (Signed)
Please stop at the lab.  Please continue Methimazole 2.5 mg daily for now.  Please come back for a follow-up appointment in 6 months.

## 2016-07-14 NOTE — Progress Notes (Signed)
Patient ID: Doris DecampJanie T Dresser, female   DOB: 05-Jun-1946, 70 y.o.   MRN: [id number]   HPI  Doris [name] is a 70 y.o.-year-old female, initially referred by her PCP, Tacey RuizEmily Dolleschel, NP (Dr Barron Alvineavid [facility] [place]), now returning for f/u for Graves ds. Last visit 6 mo ago.  Reviewed and addended hx: She had a gastroenteritis on 10/25/2014 >> felt weak after this. [facility] - TSH found low mid-March. She then had the TSH rechecked at a visit with PCP: TSH still low.   She had weakness in the past with her thyrotoxic episodes, including winter 2016 >> TFTs abnormal >> restarted MMI 5 mg daily in 10/2015 >> we were able to decrease the dose to 2.5 mg daily in 12/2015. Subsequent labs were normal in 01/2016 >> we continue the same low MMI dose.  I reviewed pt's thyroid tests: Lab Results  Component Value Date   TSH 2.24 02/21/2016   TSH 4.40 01/11/2016   TSH 0.04 (L) 11/15/2015   TSH 1.82 09/12/2015   TSH 27.03 (H) 08/13/2015   FREET4 0.88 02/21/2016   FREET4 0.62 01/11/2016   FREET4 1.84 (H) 11/15/2015   FREET4 0.77 09/12/2015   FREET4 0.47 (L) 08/13/2015  11/21/2014: TSH 0.229 (0.450-4.5), TT4 11.9 (4.5-12)  11/04/2014: TSH 0.326 02/2014: TSH 2.530  05/29/2015: Thyroid uptake and scan: The 24 hour radioactive iodine uptake is equal to 40.9%. There is uniform tracer uptake throughout both lobes. No dominant hot or cold nodule identified. IMPRESSION: 1. Elevated 24 hour radioactive uptake equal 40.9%. 2. Patient's hyperthyroidism may be due to Grave's disease.  Pt denies feeling nodules in neck, hoarseness, dysphagia/odynophagia, SOB with lying down; she c/o: - no fatigue - no excessive sweating/heat intolerance - no tremors - no anxiety - no palpitations - no hyperdefecation - no weight loss, actually + 8 lbs since last visit. - no hair loss  Pt does have a FH of thyroid ds. - + FH of thyroid cancer in daughter. No h/o radiation tx to  head or neck.  No seaweed or kelp, no recent contrast studies. No steroid use. No herbal supplements. No Biotin use.  She also has a history of HTN.  ROS: Constitutional: + weight gain, no fatigue, no subjective hyperthermia/hypothermia Eyes: no blurry vision, no xerophthalmia ENT: no sore throat, no nodules palpated in throat, no dysphagia/odynophagia, no hoarseness Cardiovascular: no CP/SOB/palpitations/leg swelling Respiratory: no cough/SOB Gastrointestinal: no N/V/D/C Musculoskeletal: no muscle/joint aches Skin: no rashes Neurological: no tremors/numbness/tingling/dizziness  I reviewed pt's medications, allergies, PMH, social hx, family hx, and changes were documented in the history of present illness. Otherwise, unchanged from my initial visit note.  Past Medical History:  Diagnosis Date  . Hypertension   . Positive TB test   . Thyroid disease    Past Surgical History:  Procedure Laterality Date  . CESAREAN SECTION      History   Social History  . Marital Status: Married    Spouse Name: N/A  . Number of Children: 5   Occupational History  . Retired Runner, broadcasting/film/videoteacher   Social History Main Topics  . Smoking status: Never Smoker   . Smokeless tobacco: Not on file  . Alcohol Use: No  . Drug Use: No   Current Outpatient Prescriptions on File Prior to Visit  Medication Sig Dispense Refill  . acetaminophen (TYLENOL) 325 MG tablet Take 162.5 mg by mouth every 6 (six) hours as needed for headache.    .Marland Kitchen  amLODipine (NORVASC) 5 MG tablet TAKE 1 TABLET BY MOUTH EVERY DAY 30 tablet 3  . amLODipine (NORVASC) 5 MG tablet TAKE 1 TABLET BY MOUTH EVERY DAY 30 tablet 3  . amLODipine (NORVASC) 5 MG tablet TAKE 1 TABLET BY MOUTH EVERY DAY 30 tablet 6  . losartan-hydrochlorothiazide (HYZAAR) 100-25 MG per tablet Take 0.5 tablets by mouth 2 (two) times daily.    Marland Kitchen. losartan-hydrochlorothiazide (HYZAAR) 100-25 MG tablet TAKE 1 TABLET BY MOUTH EVERY DAY 90 tablet 3  . methimazole (TAPAZOLE) 5 MG  tablet Take 5 mg by mouth every other day.  1  . methimazole (TAPAZOLE) 5 MG tablet TAKE 1 TABLET(5 MG) BY MOUTH TWICE DAILY 60 tablet 0  . Multiple Vitamin (MULTIVITAMIN WITH MINERALS) TABS tablet Take 1 tablet by mouth daily.     No current facility-administered medications on file prior to visit.    No Known Allergies   FH: - HTN in M - HL in M, sister - also see HPI  PE: BP (!) 146/90   Pulse 82   Ht 5' 2.75" (1.594 m)   Wt 155 lb 9.6 oz (70.6 kg)   SpO2 97%   BMI 27.78 kg/m  Body mass index is 27.78 kg/m. Wt Readings from Last 3 Encounters:  07/14/16 155 lb 9.6 oz (70.6 kg)  01/14/16 147 lb 8 oz (66.9 kg)  01/11/16 147 lb 6.4 oz (66.9 kg)   Constitutional: slightly overweight, in NAD Eyes: PERRLA, EOMI, no exophthalmos, no lid lag, no stare ENT: moist mucous membranes, no thyromegaly, no cervical lymphadenopathy Cardiovascular: RRR, + 1/6 SEM, no RG Respiratory: CTA B Gastrointestinal: abdomen soft, NT, ND, BS+ Musculoskeletal: no deformities, strength intact in all 4 Skin: moist, warm, no rashes Neurological: no tremor with outstretched hands, DTR normal in all 4  ASSESSMENT: 1. Graves ds.   PLAN:  1. Patient with h/o Graves ds, with improved TFTs on MMI. We reduced the dose at last visit, to 2.5 mg daily. She feels great on this dose. She gained weight but 2/2 dietary indulgences. - again reviewed the results of her Uptake and scan and discussed about possible tx options >> we decided to continue with MMI for now as she is responding well to I. We discussed about the possibility to stay on the low dose MMI for longer >> she agrees - will check the TSH, fT3 and fT4 today - I do not feel that we need to add beta blockers at this time, since she is not tachycardic, anxious, or tremulous - I advised her to join my chart to communicate easier >> refused - RTC in 6 mo   Office Visit on 07/14/2016  Component Date Value Ref Range Status  . Free T4 07/14/2016 0.85   0.60 - 1.60 ng/dL Final   Comment: Specimens from patients who are undergoing biotin therapy and /or ingesting biotin supplements may contain high levels of biotin.  The higher biotin concentration in these specimens interferes with this Free T4 assay.  Specimens that contain high levels  of biotin may cause false high results for this Free T4 assay.  Please interpret results in light of the total clinical presentation of the patient.    . T3, Free 07/14/2016 3.1  2.3 - 4.2 pg/mL Final  . TSH 07/14/2016 1.30  0.35 - 4.50 uIU/mL Final    TFTs are great! Will continue the low dose MMI for now until next visit.  Carlus Pavlovristina Delfina Schreurs, MD PhD [facility]

## 2016-08-01 ENCOUNTER — Ambulatory Visit: Payer: Medicare Other

## 2016-09-03 ENCOUNTER — Ambulatory Visit (INDEPENDENT_AMBULATORY_CARE_PROVIDER_SITE_OTHER): Payer: Medicare Other | Admitting: Family

## 2016-09-03 ENCOUNTER — Encounter: Payer: Self-pay | Admitting: Family

## 2016-09-03 ENCOUNTER — Telehealth: Payer: Self-pay | Admitting: Nurse Practitioner

## 2016-09-03 VITALS — BP 154/80 | HR 78 | Temp 98.0°F | Ht 63.0 in | Wt 155.6 lb

## 2016-09-03 DIAGNOSIS — I10 Essential (primary) hypertension: Secondary | ICD-10-CM | POA: Diagnosis not present

## 2016-09-03 DIAGNOSIS — E0500 Thyrotoxicosis with diffuse goiter without thyrotoxic crisis or storm: Secondary | ICD-10-CM | POA: Diagnosis not present

## 2016-09-03 DIAGNOSIS — Z1231 Encounter for screening mammogram for malignant neoplasm of breast: Secondary | ICD-10-CM

## 2016-09-03 DIAGNOSIS — H905 Unspecified sensorineural hearing loss: Secondary | ICD-10-CM

## 2016-09-03 DIAGNOSIS — Z1239 Encounter for other screening for malignant neoplasm of breast: Secondary | ICD-10-CM

## 2016-09-03 DIAGNOSIS — H9190 Unspecified hearing loss, unspecified ear: Secondary | ICD-10-CM | POA: Insufficient documentation

## 2016-09-03 NOTE — Assessment & Plan Note (Signed)
Ordered.  Patient will schedule. 

## 2016-09-03 NOTE — Telephone Encounter (Signed)
FYI

## 2016-09-03 NOTE — Patient Instructions (Addendum)
Due for physical, please make an appt at checkout in 3 months  Check BP when you get home and call with results.   We placed a referral. Mammogram this year. I asked that you call one the below locations and schedule this when it is convenient for you.   If you have dense breasts, you may ask for 3D mammogram over the traditional 2D mammogram as new evidence suggest 3D is superior. Please note that NOT all insurance companies cover 3D and you may have to pay a higher copay. You may call your insurance company to further clarify your benefits.   Options for Mammogram.    [facility]  [address]  [place], [place]  [phone]  * [facility]*   [facility] [address] [place], [place] [phone] * Note if you ask for 3D mammogram at this location, you must request [place], [place] location*

## 2016-09-03 NOTE — Assessment & Plan Note (Signed)
Elevated today. Suspect stress and driving may have contributing as usually better control. She will call with blood pressure when she gets home. May consider increasing medication.

## 2016-09-03 NOTE — Progress Notes (Signed)
Pre visit review using our clinic review tool, if applicable. No additional management support is needed unless otherwise documented below in the visit note. 

## 2016-09-03 NOTE — Assessment & Plan Note (Signed)
Symptomatically stable. Follows with endocrine.

## 2016-09-03 NOTE — Telephone Encounter (Signed)
Noted  

## 2016-09-03 NOTE — Telephone Encounter (Signed)
Pt called to let M. Arnett of her bp reading when she got home. Bp 135/78 heart rate 71.

## 2016-09-03 NOTE — Progress Notes (Signed)
Subjective:    Patient ID: Doris [name], female    DOB: June 04, 1946, 71 y.o.   MRN: [id number]  CC: Doris [name] is a 71 y.o. female who presents today for follow up.   HPI:   Hyperthyroid- 05/2016 normal TSH. Follows with Dr. Ester Rink  HTN- Average BP  130/65 at  Home. 'Running around today, stressed' and makes 'my blood pressure high.' Follows with Dr Kirke Corin. Compliant with medication. Denies exertional chest pain or pressure, numbness or tingling radiating to left arm or jaw, palpitations, dizziness, frequent headaches, changes in vision, or shortness of breath.    On way out door patient mentioned, several months ago had episode of vertigo, which resolved on its own. No hearing loss, ear pain or pressure. Reports 'sensitive hearing' to loud noises. No HA.               HISTORY:  Past Medical History:  Diagnosis Date  . Hypertension   . Positive TB test   . Thyroid disease    Past Surgical History:  Procedure Laterality Date  . CESAREAN SECTION     Family History  Problem Relation Age of Onset  . Cancer Mother     Breast Cancer  . Cancer Father     throat Cancer  . Thyroid cancer Daughter     Allergies: Patient has no known allergies. Current Outpatient Prescriptions on File Prior to Visit  Medication Sig Dispense Refill  . acetaminophen (TYLENOL) 325 MG tablet Take 162.5 mg by mouth every 6 (six) hours as needed for headache.    Marland Kitchen amLODipine (NORVASC) 5 MG tablet TAKE 1 TABLET BY MOUTH EVERY DAY 30 tablet 3  . Cholecalciferol (VITAMIN D3) 1000 units CAPS Take by mouth.    . losartan-hydrochlorothiazide (HYZAAR) 100-25 MG tablet TAKE 1 TABLET BY MOUTH EVERY DAY 90 tablet 3  . methimazole (TAPAZOLE) 5 MG tablet Take 5 mg by mouth every other day.  1  . Multiple Vitamin (MULTIVITAMIN WITH MINERALS) TABS tablet Take 1 tablet by mouth daily.     No current facility-administered medications on file prior to visit.     Social History  Substance Use Topics    . Smoking status: Never Smoker  . Smokeless tobacco: Never Used  . Alcohol use No    Review of Systems  Constitutional: Negative for chills and fever.  HENT: Negative for ear discharge, ear pain and hearing loss.   Eyes: Negative for visual disturbance.  Respiratory: Negative for cough, shortness of breath and wheezing.   Cardiovascular: Negative for chest pain and palpitations.  Gastrointestinal: Negative for nausea and vomiting.  Neurological: Negative for headaches.      Objective:    BP (!) 154/80   Pulse 78   Temp 98 F (36.7 C) (Oral)   Ht 5\' 3"  (1.6 m)   Wt 155 lb 9.6 oz (70.6 kg)   SpO2 97%   BMI 27.56 kg/m  BP Readings from Last 3 Encounters:  09/03/16 (!) 154/80  07/14/16 (!) 146/90  01/14/16 (!) 148/78   Wt Readings from Last 3 Encounters:  09/03/16 155 lb 9.6 oz (70.6 kg)  07/14/16 155 lb 9.6 oz (70.6 kg)  01/14/16 147 lb 8 oz (66.9 kg)    Physical Exam  Constitutional: She appears well-developed and well-nourished.  Eyes: Conjunctivae are normal.  Neck: No thyroid mass and no thyromegaly present.  Cardiovascular: Normal rate, regular rhythm, normal heart sounds and normal pulses.   Pulmonary/Chest: Effort normal and breath sounds  normal. She has no wheezes. She has no rhonchi. She has no rales.  Lymphadenopathy:       Head (right side): No submental, no submandibular, no tonsillar, no preauricular, no posterior auricular and no occipital adenopathy present.       Head (left side): No submental, no submandibular, no tonsillar, no preauricular, no posterior auricular and no occipital adenopathy present.    She has no cervical adenopathy.  Neurological: She is alert.  Skin: Skin is warm and dry.  Psychiatric: She has a normal mood and affect. Her speech is normal and behavior is normal. Thought content normal.  Vitals reviewed.      Assessment & Plan:   Problem List Items Addressed This Visit      Cardiovascular and Mediastinum   Hypertension     Elevated today. Suspect stress and driving may have contributing as usually better control. She will call with blood pressure when she gets home. May consider increasing medication.         Endocrine   Graves disease    Symptomatically stable. Follows with endocrine.        Nervous and Auditory   Perceived hearing changes    Offered ENT referral or audilogy referral. Declined as patient would prefer to monitor symptom and we can discuss at follow up.       Relevant Orders   Ambulatory referral to ENT     Other   Screening for breast cancer - Primary    Ordered. Patient will schedule.       Relevant Orders   MM DIGITAL SCREENING BILATERAL       I am having Ms. Terrilee CroakKnight maintain her multivitamin with minerals, acetaminophen, amLODipine, methimazole, losartan-hydrochlorothiazide, and Vitamin D3.   No orders of the defined types were placed in this encounter.   Return precautions given.   Risks, benefits, and alternatives of the medications and treatment plan prescribed today were discussed, and patient expressed understanding.   Education regarding symptom management and diagnosis given to patient on AVS.  Continue to follow with Carollee Leitzarrie M Doss, NP for routine health maintenance.   Silverio DecampJanie T Wallick and I agreed with plan.   Rennie PlowmanMargaret Arnett, FNP

## 2016-09-03 NOTE — Assessment & Plan Note (Signed)
Offered ENT referral or audilogy referral. Declined as patient would prefer to monitor symptom and we can discuss at follow up.

## 2016-10-04 ENCOUNTER — Other Ambulatory Visit: Payer: Self-pay | Admitting: Internal Medicine

## 2016-10-08 ENCOUNTER — Encounter: Payer: Self-pay | Admitting: Nurse Practitioner

## 2016-10-08 ENCOUNTER — Telehealth: Payer: Self-pay | Admitting: Internal Medicine

## 2016-10-08 ENCOUNTER — Other Ambulatory Visit: Payer: Self-pay

## 2016-10-08 MED ORDER — METHIMAZOLE 5 MG PO TABS: ORAL_TABLET | ORAL | 0 refills | Status: DC

## 2016-10-08 NOTE — Telephone Encounter (Signed)
rx submitted.  

## 2016-10-08 NOTE — Telephone Encounter (Signed)
Pharmacy stated prescrption methimazole (TAPAZOLE) 5 MG tablet wasn't written correctly, and patient due to take it this morning. Please advise  [facility] [id number] - [address] [place], [place] - [address] AT [facility] [address] [phone] (Phone) [phone] (Fax)

## 2016-10-29 ENCOUNTER — Ambulatory Visit: Payer: Medicare Other

## 2016-11-11 ENCOUNTER — Ambulatory Visit: Payer: Medicare Other | Admitting: Internal Medicine

## 2016-12-15 ENCOUNTER — Telehealth: Payer: Self-pay | Admitting: Cardiovascular Disease

## 2016-12-15 NOTE — Telephone Encounter (Signed)
Returned call to patient. Patient said she went to [facility] ED yesterday with the worse back muscle spasm she's ever had.  She gets them occasionally but this is the first one where she broke out in a sweat. The ED physician asked her to f/u with her cardiologist soon. She is not having any symptoms right now. Patient has appt with Dr Kirke Corin on 01/16/17 already and wants to know if she should come in earlier. Dr Kirke Corin does not have any open slots an earlier at this time. Offered appt with Eula Listen, PA on 12/31/16.  Patient declined at this time and will wait for her appt on 01/16/17 unless Dr Kirke Corin wants her to be seen sooner.  Will route to Dr Kirke Corin for advice on sooner appt if needed.

## 2016-12-15 NOTE — Telephone Encounter (Signed)
Pt was seen in ED, states she had a muscle spasm in her back, and broke out into a sweat. Everything checked out ok, but wanted to talk with a nurse. Please call.

## 2016-12-18 NOTE — Telephone Encounter (Signed)
No need for urgent appointment. Continue to monitor symptoms.

## 2016-12-30 ENCOUNTER — Other Ambulatory Visit: Payer: Self-pay | Admitting: Cardiovascular Disease

## 2016-12-31 ENCOUNTER — Ambulatory Visit: Payer: Medicare Other | Admitting: Physician Assistant

## 2017-01-09 ENCOUNTER — Ambulatory Visit (INDEPENDENT_AMBULATORY_CARE_PROVIDER_SITE_OTHER): Payer: Medicare Other | Admitting: Internal Medicine

## 2017-01-09 ENCOUNTER — Encounter: Payer: Self-pay | Admitting: Internal Medicine

## 2017-01-09 VITALS — BP 124/82 | HR 77 | Wt 155.0 lb

## 2017-01-09 DIAGNOSIS — E0500 Thyrotoxicosis with diffuse goiter without thyrotoxic crisis or storm: Secondary | ICD-10-CM | POA: Diagnosis not present

## 2017-01-09 LAB — T4, FREE: Free T4: 0.86 ng/dL (ref 0.60–1.60)

## 2017-01-09 LAB — T3, FREE: T3, Free: 3.2 pg/mL (ref 2.3–4.2)

## 2017-01-09 LAB — TSH: TSH: 2.89 u[IU]/mL (ref 0.35–4.50)

## 2017-01-09 NOTE — Patient Instructions (Addendum)
Please stop at the lab.  Please continue Methimazole 5 mg every other day for now.  Please come back for a follow-up appointment in 1 year but labs in 6 months.

## 2017-01-09 NOTE — Progress Notes (Signed)
Patient ID: Doris [name], female   DOB: Jan 22, 1946, 71 y.o.   MRN: [id number]   HPI  Doris [name] is a 71 y.o.-year-old very pleasant female, initially referred by her PCP, Tacey RuizEmily Dolleschel, NP (Dr Barron Alvineavid [facility] [place]), now returning for f/u for Graves ds. Last visit 6 mo ago.  Reviewed and addended hx: She had a gastroenteritis on 10/25/2014 >> felt weak after this. [facility] - TSH found low mid-March. She then had the TSH rechecked at a visit with PCP: TSH still low.   She had weakness in the past with her thyrotoxic episodes, including winter 2016 >> TFTs abnormal >> restarted MMI 5 mg daily in 10/2015 >> we were able to decrease the dose to 2.5 mg daily (5 mg qod) in 12/2015.   She is doing well on this dose without complaints and with normal TFTs, per last check in 06/2016.  I reviewed pt's thyroid tests: Lab Results  Component Value Date   TSH 1.30 07/14/2016   TSH 2.24 02/21/2016   TSH 4.40 01/11/2016   TSH 0.04 (L) 11/15/2015   TSH 1.82 09/12/2015   FREET4 0.85 07/14/2016   FREET4 0.88 02/21/2016   FREET4 0.62 01/11/2016   FREET4 1.84 (H) 11/15/2015   FREET4 0.77 09/12/2015  11/21/2014: TSH 0.229 (0.450-4.5), TT4 11.9 (4.5-12)  11/04/2014: TSH 0.326 02/2014: TSH 2.530  05/29/2015: Thyroid uptake and scan: The 24 hour radioactive iodine uptake is equal to 40.9%. There is uniform tracer uptake throughout both lobes. No dominant hot or cold nodule identified. IMPRESSION: 1. Elevated 24 hour radioactive uptake equal 40.9%. 2. Patient's hyperthyroidism may be due to Grave's disease.  Pt denies: - feeling nodules in neck - hoarseness - dysphagia - choking - SOB with lying down  She mentions: - 2 episodes of palpitations >> in am, after cocoa - 3 weeks ago  Pt does have a FH of thyroid ds.  + FH of thyroid cancer in daughter.  No FH of thyroid cancer. No h/o radiation tx to head or neck. No seaweed or kelp. No recent  contrast studies. No herbal supplements. No Biotin use. No recent steroids use.   Both her daughters are pregnant and will give birth this fall.  ROS: Constitutional: no weight gain/no weight loss, no fatigue, no subjective hyperthermia, no subjective hypothermia Eyes: no blurry vision, no xerophthalmia ENT: no sore throat, no nodules palpated in throat, no dysphagia, no odynophagia, no hoarseness Cardiovascular: no CP/no SOB/+ palpitations/no leg swelling Respiratory: no cough/no SOB/no wheezing Gastrointestinal: no N/no V/no D/no C/no acid reflux Musculoskeletal: no muscle aches/no joint aches Skin: no rashes, no hair loss Neurological: no tremors/no numbness/no tingling/no dizziness  I reviewed pt's medications, allergies, PMH, social hx, family hx, and changes were documented in the history of present illness. Otherwise, unchanged from my initial visit note.  Past Medical History:  Diagnosis Date  . Hypertension   . Positive TB test   . Thyroid disease    Past Surgical History:  Procedure Laterality Date  . CESAREAN SECTION      History   Social History  . Marital Status: Married    Spouse Name: N/A  . Number of Children: 5   Occupational History  . Retired Runner, broadcasting/film/videoteacher   Social History Main Topics  . Smoking status: Never Smoker   . Smokeless tobacco: Not on file  . Alcohol Use: No  . Drug Use: No   Current Outpatient Prescriptions on File Prior  to Visit  Medication Sig Dispense Refill  . acetaminophen (TYLENOL) 325 MG tablet Take 162.5 mg by mouth every 6 (six) hours as needed for headache.    Marland Kitchen amLODipine (NORVASC) 5 MG tablet TAKE 1 TABLET BY MOUTH EVERY DAY 30 tablet 3  . amLODipine (NORVASC) 5 MG tablet TAKE 1 TABLET BY MOUTH EVERY DAY 30 tablet 2  . Cholecalciferol (VITAMIN D3) 1000 units CAPS Take by mouth.    . losartan-hydrochlorothiazide (HYZAAR) 100-25 MG tablet TAKE 1 TABLET BY MOUTH EVERY DAY 90 tablet 3  . methimazole (TAPAZOLE) 5 MG tablet Take one  half of tablet (2.5) daily. 90 tablet 0  . Multiple Vitamin (MULTIVITAMIN WITH MINERALS) TABS tablet Take 1 tablet by mouth daily.     No current facility-administered medications on file prior to visit.    No Known Allergies   FH: - HTN in M - HL in M, sister - also see HPI  PE: BP 124/82 (BP Location: Left Arm, Patient Position: Sitting)   Pulse 77   Wt 155 lb (70.3 kg)   SpO2 98%   BMI 27.46 kg/m  Body mass index is 27.46 kg/m. Wt Readings from Last 3 Encounters:  01/09/17 155 lb (70.3 kg)  09/03/16 155 lb 9.6 oz (70.6 kg)  07/14/16 155 lb 9.6 oz (70.6 kg)   Constitutional: slightly overweight, in NAD Eyes: PERRLA, EOMI, no exophthalmos ENT: moist mucous membranes, no thyromegaly, no cervical lymphadenopathy Cardiovascular: RRR,  + 1/6 SEM, no RG Respiratory: CTA B Gastrointestinal: abdomen soft, NT, ND, BS+ Musculoskeletal: no deformities, strength intact in all 4 Skin: moist, warm, no rashes Neurological: no tremor with outstretched hands, DTR normal in all 4  ASSESSMENT: 1. Graves ds.   PLAN:  1. Patient with h/o Graves ds, with improved TFTs on MMI. We are maintaining her on a low dose of MMI, 2.5 mg daily which works well for her. She feels great, and has no complaints, exceopt 2 episodes of palpitations after caffeine. OTW, no weight loss, no heat intolerance, no tremors. - we discussed about the new studies that showed that MMI at a low dose if safe and effective against Graves recurrence - will check the TSH, fT3 and fT4 today - no need for beta blockers at this time, since she is not tachycardic, anxious, or tremulous - I advised her to join my chart to communicate easier >> refused - RTC in 1 year for an appt and 6 mo for labs  Office Visit on 01/09/2017  Component Date Value Ref Range Status  . Free T4 01/09/2017 0.86  0.60 - 1.60 ng/dL Final   Comment: Specimens from patients who are undergoing biotin therapy and /or ingesting biotin supplements may  contain high levels of biotin.  The higher biotin concentration in these specimens interferes with this Free T4 assay.  Specimens that contain high levels  of biotin may cause false high results for this Free T4 assay.  Please interpret results in light of the total clinical presentation of the patient.    . T3, Free 01/09/2017 3.2  2.3 - 4.2 pg/mL Final  . TSH 01/09/2017 2.89  0.35 - 4.50 uIU/mL Final   Normal TFTs >> continue current MMI dose and recheck labs in 6 mo.   Carlus Pavlov, MD PhD [facility]

## 2017-01-14 ENCOUNTER — Telehealth: Payer: Self-pay

## 2017-01-14 ENCOUNTER — Telehealth: Payer: Self-pay | Admitting: Internal Medicine

## 2017-01-14 NOTE — Telephone Encounter (Signed)
Please call patient to go over 01/09/2017 lab results

## 2017-01-14 NOTE — Telephone Encounter (Signed)
Called patient and gave lab results. Patient had no questions or concerns. Mailed results.

## 2017-01-14 NOTE — Telephone Encounter (Signed)
Called and gave labs, mailed results to patient.

## 2017-01-16 ENCOUNTER — Ambulatory Visit: Payer: Medicare Other | Admitting: Cardiovascular Disease

## 2017-01-29 NOTE — Telephone Encounter (Signed)
This encounter was made in Error

## 2017-03-10 ENCOUNTER — Encounter: Payer: Self-pay | Admitting: Cardiovascular Disease

## 2017-03-10 ENCOUNTER — Ambulatory Visit (INDEPENDENT_AMBULATORY_CARE_PROVIDER_SITE_OTHER): Payer: Medicare Other | Admitting: Cardiovascular Disease

## 2017-03-10 VITALS — BP 126/78 | HR 78 | Ht 63.0 in | Wt 156.5 lb

## 2017-03-10 DIAGNOSIS — I10 Essential (primary) hypertension: Secondary | ICD-10-CM | POA: Diagnosis not present

## 2017-03-10 MED ORDER — AMLODIPINE BESYLATE 5 MG PO TABS: 5.0000 mg | ORAL_TABLET | Freq: Every day | ORAL | 3 refills | Status: DC

## 2017-03-10 NOTE — Patient Instructions (Signed)
Medication Instructions: Continue same medications.   Labwork: None.   Procedures/Testing: None.   Follow-Up: 1 year with Dr. Arida.   Any Additional Special Instructions Will Be Listed Below (If Applicable).     If you need a refill on your cardiac medications before your next appointment, please call your pharmacy.   

## 2017-03-10 NOTE — Progress Notes (Signed)
Cardiology Office Note   Date:  03/10/2017   ID:  Doris [name], Doris [name] 01/23/1946, MRN [id number]  PCP:  Allegra Grana, FNP  Cardiologist:   Lorine Bears, MD   Chief Complaint  Patient presents with  . other    12 month follow up. Meds reviewd by the pt. verbally. "doing well." Pt. was at [facility] ER with muscle spasms in April 2018, EKG was performed at that time.       History of Present Illness: Doris [name] is a 71 y.o. female who presents for  a followup visit regarding hypertension.  Blood pressure has been well controlled on amlodipine and losartan-hydrochlorothiazide.  She went to [facility] emergency department in April with chest pain described as muscle spasms which was brief overall but she became anxious. Cardiac enzymes were negative and EKG was normal. No recurrent symptoms since then. She has been doing very well with no exertional symptoms. Blood pressure is well controlled. No side effects with medications.   Past Medical History:  Diagnosis Date  . Hypertension   . Positive TB test   . Thyroid disease     Past Surgical History:  Procedure Laterality Date  . CESAREAN SECTION       Current Outpatient Prescriptions  Medication Sig Dispense Refill  . acetaminophen (TYLENOL) 325 MG tablet Take 162.5 mg by mouth every 6 (six) hours as needed for headache.    Marland Kitchen amLODipine (NORVASC) 5 MG tablet TAKE 1 TABLET BY MOUTH EVERY DAY 30 tablet 2  . Cholecalciferol (VITAMIN D3) 1000 units CAPS Take by mouth.    . losartan-hydrochlorothiazide (HYZAAR) 100-25 MG tablet TAKE 1 TABLET BY MOUTH EVERY DAY 90 tablet 3  . methimazole (TAPAZOLE) 5 MG tablet Take one half of tablet (2.5) daily. 90 tablet 0  . Multiple Vitamin (MULTIVITAMIN WITH MINERALS) TABS tablet Take 1 tablet by mouth daily.     No current facility-administered medications for this visit.     Allergies:   Patient has no known allergies.    Social History:  The patient  reports that she has never  smoked. She has never used smokeless tobacco. She reports that she does not drink alcohol or use drugs.   Family History:  The patient's family history includes Cancer in her father and mother; Thyroid cancer in her daughter.    ROS:  Please see the history of present illness.   Otherwise, review of systems are positive for none.   All other systems are reviewed and negative.    PHYSICAL EXAM: VS:  BP 126/78 (BP Location: Left Arm, Patient Position: Sitting, Cuff Size: Normal)   Pulse 78   Ht 5\' 3"  (1.6 m)   Wt 156 lb 8 oz (71 kg)   BMI 27.72 kg/m  , BMI Body mass index is 27.72 kg/m. GEN: Well nourished, well developed, in no acute distress  HEENT: normal  Neck: no JVD, carotid bruits, or masses Cardiac: RRR; no  rubs, or gallops,no edema . 1/ 6 systolic ejection murmur in the pulmonic area which is early peaking. Respiratory:  clear to auscultation bilaterally, normal work of breathing GI: soft, nontender, nondistended, + BS MS: no deformity or atrophy  Skin: warm and dry, no rash Neuro:  Strength and sensation are intact Psych: euthymic mood, full affect   EKG:  EKG is not ordered today.    Recent Labs: 01/09/2017: TSH 2.89    Lipid Panel    Component Value Date/Time   LDLDIRECT 74.0  06/07/2015 1057      Wt Readings from Last 3 Encounters:  03/10/17 156 lb 8 oz (71 kg)  01/09/17 155 lb (70.3 kg)  09/03/16 155 lb 9.6 oz (70.6 kg)      ASSESSMENT AND PLAN:  1.  Essential hypertension: Blood pressure is well controlled on current medications with no side effects. Continue same medications. I reviewed her labs from April which showed mildly elevated BUN.  I advised her to increase fluid intake.  2. Functional heart murmur: This does not require further investigation at the present time.   Disposition:   FU with me in 1 year  Signed,  Lorine BearsMuhammad Alayha Babineaux, MD  03/10/2017 2:27 PM    [facility]

## 2017-03-18 ENCOUNTER — Other Ambulatory Visit: Payer: Self-pay | Admitting: Cardiovascular Disease

## 2017-03-20 ENCOUNTER — Telehealth: Payer: Self-pay

## 2017-03-20 ENCOUNTER — Telehealth: Payer: Self-pay | Admitting: Internal Medicine

## 2017-03-20 NOTE — Telephone Encounter (Signed)
No, please see my last note

## 2017-03-20 NOTE — Telephone Encounter (Signed)
Patient called to advise that she has been doing well and needs to know if labs are needed. Please call patient to advise.

## 2017-03-20 NOTE — Telephone Encounter (Signed)
Called to advise that no labs were needed right now. Patient understood, no questions at this time.

## 2017-03-20 NOTE — Telephone Encounter (Signed)
Called to advise that no labs were needed right now. Patient understood, no questions at this time. 

## 2017-03-20 NOTE — Telephone Encounter (Signed)
Please advise. If patient needs labs.  Thank you!

## 2017-03-27 ENCOUNTER — Other Ambulatory Visit: Payer: Self-pay | Admitting: Internal Medicine

## 2017-04-04 ENCOUNTER — Other Ambulatory Visit: Payer: Self-pay | Admitting: Cardiovascular Disease

## 2017-05-25 ENCOUNTER — Telehealth: Payer: Self-pay | Admitting: Cardiovascular Disease

## 2017-05-25 ENCOUNTER — Telehealth: Payer: Self-pay

## 2017-05-25 ENCOUNTER — Telehealth: Payer: Self-pay | Admitting: Internal Medicine

## 2017-05-25 NOTE — Telephone Encounter (Signed)
Called and scheduled patient for labs tomorrow. Patient will be here then.  

## 2017-05-25 NOTE — Telephone Encounter (Signed)
Pt w/history of Graves and HTN, c/o elevated HR. She doesn't know what her HR has been but feels it racing. Endocrinologist has ordered labs which will be routed to Dr. Kirke Corin.  Pt appreciative of the call with no further questions at this time.

## 2017-05-25 NOTE — Telephone Encounter (Signed)
Pt calling needing to ask about her HR   She states she has graves disease and would like to know if the HR issue she has is related to that  Please call back

## 2017-05-25 NOTE — Telephone Encounter (Signed)
Called and scheduled patient for labs tomorrow. Patient will be here then.

## 2017-05-25 NOTE — Telephone Encounter (Signed)
Patient called to advise that her heart has been racing and needs to know what the next steps needs to be. Call her mobile phone to advise.

## 2017-05-25 NOTE — Telephone Encounter (Signed)
Please advise. Thank you

## 2017-05-25 NOTE — Telephone Encounter (Signed)
Let's have her back for TSH, fT4 and fT3 today. She is also seeing cardiology (Dr. Kirke Corin).

## 2017-05-26 ENCOUNTER — Other Ambulatory Visit (INDEPENDENT_AMBULATORY_CARE_PROVIDER_SITE_OTHER): Payer: Medicare Other

## 2017-05-26 DIAGNOSIS — E0500 Thyrotoxicosis with diffuse goiter without thyrotoxic crisis or storm: Secondary | ICD-10-CM

## 2017-05-26 LAB — TSH: TSH: 4.46 u[IU]/mL (ref 0.35–4.50)

## 2017-05-26 LAB — T3, FREE: T3, Free: 3 pg/mL (ref 2.3–4.2)

## 2017-05-26 LAB — T4, FREE: Free T4: 0.86 ng/dL (ref 0.60–1.60)

## 2017-05-27 ENCOUNTER — Telehealth: Payer: Self-pay

## 2017-05-27 ENCOUNTER — Telehealth: Payer: Self-pay | Admitting: Cardiovascular Disease

## 2017-05-27 DIAGNOSIS — R002 Palpitations: Secondary | ICD-10-CM

## 2017-05-27 NOTE — Telephone Encounter (Signed)
I am fine with a 48 h Holter but she might be better of getting her own Kardia mobile monitor.

## 2017-05-27 NOTE — Telephone Encounter (Signed)
-----   Message from Carlus Pavlov, MD sent at 05/26/2017 12:17 PM EDT ----- [name], can you please call pt: TFTs are normal. The palpitations do not appear to be related to the thyroid. Please continue with the plan to d/w cardiology.

## 2017-05-27 NOTE — Telephone Encounter (Signed)
Pt states she has had palpitations on and off Sept 29.,30, and Oct 1. Denies any other symptoms. Please call.

## 2017-05-27 NOTE — Telephone Encounter (Signed)
Pt reports continued palpitations reporting brief episodes, heart racing. Resolves on its own. More noticeable in the early morning. BP controlled but she does not have readings. HR typically in the 70s. Endocrinologist ordered labs as pt has Graves disease. TSH normal and are available in Epic for review.  We discussed possibility of wearing a holter monitor for which she is agreeable if Dr. Kirke Corin feels this is appropriate. Routed to MD

## 2017-05-27 NOTE — Telephone Encounter (Signed)
Called patient and gave lab results. Patient had no questions or concerns.  

## 2017-05-28 NOTE — Telephone Encounter (Signed)
Pt is calling back to update her cell number.

## 2017-05-28 NOTE — Telephone Encounter (Signed)
Spoke with patient and reviewed recommendations by Dr. Kirke Corin. She states that she is under some stress with her two daughters due soon. She prefers to do the 48 monitor rather than the other device due to her age. Order entered and let her know that I would have someone to please give her a call to schedule. She was very appreciative for the call back and had no further questions or concerns at this time.

## 2017-05-29 NOTE — Telephone Encounter (Signed)
Patient scheduled 10/8 at 9

## 2017-06-01 ENCOUNTER — Ambulatory Visit (INDEPENDENT_AMBULATORY_CARE_PROVIDER_SITE_OTHER): Payer: Medicare Other

## 2017-06-01 DIAGNOSIS — R002 Palpitations: Secondary | ICD-10-CM

## 2017-06-09 ENCOUNTER — Other Ambulatory Visit: Payer: Self-pay | Admitting: Cardiovascular Disease

## 2017-06-11 ENCOUNTER — Ambulatory Visit
Admission: RE | Admit: 2017-06-11 | Discharge: 2017-06-11 | Disposition: A | Discharge status: Home / Self Care | Payer: Medicare Other | Source: Ambulatory Visit | Attending: Cardiovascular Disease | Admitting: Cardiovascular Disease

## 2017-06-11 DIAGNOSIS — R002 Palpitations: Secondary | ICD-10-CM | POA: Diagnosis present

## 2017-06-12 ENCOUNTER — Telehealth: Payer: Self-pay | Admitting: Cardiovascular Disease

## 2017-06-12 NOTE — Telephone Encounter (Signed)
No answer. No voicemail set up. Spoke to patient a few moments earlier and she verbalized understanding of holter results and plan of care.

## 2017-06-12 NOTE — Telephone Encounter (Signed)
Pt returning our call  ° °

## 2017-06-15 ENCOUNTER — Other Ambulatory Visit: Payer: Self-pay

## 2017-06-15 ENCOUNTER — Other Ambulatory Visit: Payer: Self-pay | Admitting: Cardiovascular Disease

## 2017-06-15 DIAGNOSIS — R002 Palpitations: Secondary | ICD-10-CM

## 2017-06-15 DIAGNOSIS — R000 Tachycardia, unspecified: Secondary | ICD-10-CM

## 2017-06-15 DIAGNOSIS — R011 Cardiac murmur, unspecified: Secondary | ICD-10-CM

## 2017-06-15 NOTE — Telephone Encounter (Signed)
Notes recorded by Iran OuchArida, Muhammad A, MD on 06/12/2017 at 4:24 PM EDT Holter monitor showed one episode of tachycardia. If she is still having palpitations, I recommend starting Toprol 25 mg once daily and obtaining an echocardiogram.      Pt called back stating she would like to schedule an echo but does not want metoprolol at this time. Prefers to await echo results before making decision regarding medication.  Scheduled 10/23, 2pm

## 2017-06-16 ENCOUNTER — Ambulatory Visit (INDEPENDENT_AMBULATORY_CARE_PROVIDER_SITE_OTHER): Payer: Medicare Other

## 2017-06-16 ENCOUNTER — Other Ambulatory Visit: Payer: Self-pay

## 2017-06-16 DIAGNOSIS — R000 Tachycardia, unspecified: Secondary | ICD-10-CM

## 2017-06-16 DIAGNOSIS — R002 Palpitations: Secondary | ICD-10-CM | POA: Diagnosis not present

## 2017-06-16 DIAGNOSIS — R011 Cardiac murmur, unspecified: Secondary | ICD-10-CM | POA: Diagnosis not present

## 2017-07-13 ENCOUNTER — Other Ambulatory Visit: Payer: Medicare Other

## 2017-08-10 ENCOUNTER — Telehealth: Payer: Self-pay | Admitting: Internal Medicine

## 2017-08-10 DIAGNOSIS — E0500 Thyrotoxicosis with diffuse goiter without thyrotoxic crisis or storm: Secondary | ICD-10-CM

## 2017-08-10 NOTE — Telephone Encounter (Signed)
Patient asked you to call at your earliest convenience

## 2017-08-11 NOTE — Telephone Encounter (Signed)
Okay to continue with the current dose of methimazole for now, half a tablet every day.  However, I would like to get another TSH, free T4, free T3 in February, just to make sure the tests stays stable - can you please order these?

## 2017-08-11 NOTE — Telephone Encounter (Signed)
Pt called and stated she had a TSH done last week and her TSH was 4.49 and wanted you to be aware

## 2017-08-11 NOTE — Telephone Encounter (Signed)
Pt aware and labs ordered 

## 2017-09-13 ENCOUNTER — Other Ambulatory Visit: Payer: Self-pay | Admitting: Internal Medicine

## 2017-09-28 ENCOUNTER — Other Ambulatory Visit: Payer: Medicare Other

## 2017-09-29 ENCOUNTER — Other Ambulatory Visit: Payer: Medicare Other

## 2017-10-02 ENCOUNTER — Other Ambulatory Visit (INDEPENDENT_AMBULATORY_CARE_PROVIDER_SITE_OTHER): Payer: Medicare Other

## 2017-10-02 DIAGNOSIS — E0500 Thyrotoxicosis with diffuse goiter without thyrotoxic crisis or storm: Secondary | ICD-10-CM | POA: Diagnosis not present

## 2017-10-02 LAB — T4, FREE: FREE T4: 0.79 ng/dL (ref 0.60–1.60)

## 2017-10-02 LAB — TSH: TSH: 3.04 u[IU]/mL (ref 0.35–4.50)

## 2017-10-02 LAB — T3: T3, Total: 109 ng/dL (ref 76–181)

## 2017-10-05 ENCOUNTER — Telehealth: Payer: Self-pay

## 2017-10-05 NOTE — Telephone Encounter (Signed)
-----   Message from Doris Pavlovristina Gherghe, MD sent at 10/02/2017  5:35 PM EST ----- [name], can you please call pt: TFTs remain normal.

## 2017-10-05 NOTE — Telephone Encounter (Signed)
LMTCB

## 2017-10-07 ENCOUNTER — Telehealth: Payer: Self-pay

## 2017-10-07 NOTE — Telephone Encounter (Signed)
-----   Message from Cristina Gherghe, MD sent at 10/02/2017  5:35 PM EST ----- Courtney, can you please call pt: TFTs remain normal. 

## 2017-10-07 NOTE — Telephone Encounter (Signed)
Called pt. Gave results. Pt verbalized understanding.  

## 2017-10-08 ENCOUNTER — Other Ambulatory Visit: Payer: Medicare Other

## 2017-12-08 ENCOUNTER — Other Ambulatory Visit: Payer: Self-pay

## 2017-12-08 ENCOUNTER — Encounter
Admission: RE | Admit: 2017-12-08 | Discharge: 2017-12-08 | Disposition: A | Discharge status: Home / Self Care | Payer: Medicare Other | Source: Ambulatory Visit | Attending: Orthopedic Surgery | Admitting: Orthopedic Surgery

## 2017-12-08 DIAGNOSIS — Z01810 Encounter for preprocedural cardiovascular examination: Secondary | ICD-10-CM | POA: Diagnosis present

## 2017-12-08 DIAGNOSIS — I10 Essential (primary) hypertension: Secondary | ICD-10-CM | POA: Diagnosis not present

## 2017-12-08 DIAGNOSIS — Z01812 Encounter for preprocedural laboratory examination: Secondary | ICD-10-CM | POA: Diagnosis not present

## 2017-12-08 HISTORY — DX: Hyperlipidemia, unspecified: E78.5

## 2017-12-08 HISTORY — DX: Thyrotoxicosis, unspecified without thyrotoxic crisis or storm: E05.90

## 2017-12-08 LAB — BASIC METABOLIC PANEL
Anion gap: 9 (ref 5–15)
BUN: 25 mg/dL — ABNORMAL HIGH (ref 6–20)
CHLORIDE: 99 mmol/L — AB (ref 101–111)
CO2: 28 mmol/L (ref 22–32)
CREATININE: 0.91 mg/dL (ref 0.44–1.00)
Calcium: 9.5 mg/dL (ref 8.9–10.3)
Glucose, Bld: 104 mg/dL — ABNORMAL HIGH (ref 65–99)
POTASSIUM: 3.4 mmol/L — AB (ref 3.5–5.1)
SODIUM: 136 mmol/L (ref 135–145)

## 2017-12-08 LAB — CBC
HCT: 39.7 % (ref 35.0–47.0)
HEMOGLOBIN: 14 g/dL (ref 12.0–16.0)
MCH: 31.3 pg (ref 26.0–34.0)
MCHC: 35.3 g/dL (ref 32.0–36.0)
MCV: 88.6 fL (ref 80.0–100.0)
PLATELETS: 230 10*3/uL (ref 150–440)
RBC: 4.48 MIL/uL (ref 3.80–5.20)
RDW: 13.2 % (ref 11.5–14.5)
WBC: 7.4 10*3/uL (ref 3.6–11.0)

## 2017-12-08 NOTE — Patient Instructions (Signed)
Your procedure is scheduled on: Thursday, December 17, 2017 Report [facility] To find out your arrival time, please call [phone] between 1PM - 3PM on: Wednesday, December 16, 2017  REMEMBER: Instructions that are not followed completely may result in serious medical risk, up to and including death; or upon the discretion of your surgeon and anesthesiologist your surgery may need to be rescheduled.  Do not eat food after midnight the night before your procedure.  No gum chewing, lozengers or hard candies.  You may however, drink CLEAR liquids up to 2 hours before you are scheduled to arrive for your surgery. Do not drink anything within 2 hours of the start of your surgery.  Clear liquids include: - water  - apple juice without pulp - clear gatorade - black coffee or tea (Do NOT add anything to the coffee or tea) Do NOT drink anything that is not on this list.  No Alcohol for 24 hours before or after surgery.  No Smoking including e-cigarettes for 24 hours prior to surgery.  No chewable tobacco products for at least 6 hours prior to surgery.  No nicotine patches on the day of surgery.  On the morning of surgery brush your teeth with toothpaste and water, you may rinse your mouth with mouthwash if you wish. Do not swallow any toothpaste or mouthwash.  Notify your doctor if there is any change in your medical condition (cold, fever, infection).  Do not wear jewelry, make-up, hairpins, clips or nail polish.  Do not wear lotions, powders, or perfumes. You may wear deodorant.  Do not shave 48 hours prior to surgery. Men may shave face and neck.  Contacts and dentures may not be worn into surgery.  Do not bring valuables to the hospital, including drivers license, insurance or credit cards.  [facility] is not responsible for any belongings or valuables.   TAKE THESE MEDICATIONS THE MORNING OF SURGERY:  1.  Amlodipine  Use CHG Soap as  directed on instruction sheet.  NOW!  Stop Anti-inflammatories (NSAIDS) such as Advil, Aleve, Ibuprofen, Motrin, Naproxen, Naprosyn and Aspirin based products such as Excedrin, Goodys Powder, BC Powder. (May take Tylenol or Acetaminophen if needed.)  NOW!  Stop ANY OVER THE COUNTER supplements until after surgery. (May continue multivitamin.)  Wear comfortable clothing (specific to your surgery type) to the hospital.  Plan for stool softeners for home use.  If you are being discharged the day of surgery, you will not be allowed to drive home. You will need a responsible adult to drive you home and stay with you that night.   If you are taking public transportation, you will need to have a responsible adult with you. Please confirm with your physician that it is acceptable to use public transportation.   Please call [phone] if you have any questions about these instructions.

## 2017-12-16 MED ORDER — CEFAZOLIN SODIUM-DEXTROSE 2-4 GM/100ML-% IV SOLN
2.0000 g | Freq: Once | INTRAVENOUS | Status: AC
  Administered 2017-12-17: 2 g via INTRAVENOUS

## 2017-12-17 ENCOUNTER — Other Ambulatory Visit: Payer: Self-pay

## 2017-12-17 ENCOUNTER — Ambulatory Visit
Admission: RE | Admit: 2017-12-17 | Discharge: 2017-12-17 | Disposition: A | Discharge status: Home / Self Care | Payer: Medicare Other | Source: Ambulatory Visit | Attending: Orthopedic Surgery | Admitting: Orthopedic Surgery

## 2017-12-17 ENCOUNTER — Ambulatory Visit: Payer: Medicare Other | Admitting: Anesthesiology

## 2017-12-17 ENCOUNTER — Encounter
Admission: RE | Disposition: A | Discharge status: Home / Self Care | Payer: Self-pay | Source: Ambulatory Visit | Attending: Orthopedic Surgery

## 2017-12-17 DIAGNOSIS — Z79899 Other long term (current) drug therapy: Secondary | ICD-10-CM | POA: Diagnosis not present

## 2017-12-17 DIAGNOSIS — E039 Hypothyroidism, unspecified: Secondary | ICD-10-CM | POA: Insufficient documentation

## 2017-12-17 DIAGNOSIS — I10 Essential (primary) hypertension: Secondary | ICD-10-CM | POA: Insufficient documentation

## 2017-12-17 DIAGNOSIS — M67441 Ganglion, right hand: Secondary | ICD-10-CM | POA: Insufficient documentation

## 2017-12-17 DIAGNOSIS — E785 Hyperlipidemia, unspecified: Secondary | ICD-10-CM | POA: Diagnosis not present

## 2017-12-17 HISTORY — PX: EXCISION METACARPAL MASS: SHX6372

## 2017-12-17 SURGERY — EXCISION METACARPAL MASS
Anesthesia: General | Site: Hand | Laterality: Right | Wound class: "Clean "

## 2017-12-17 MED ORDER — MEPERIDINE HCL 50 MG/ML IJ SOLN: 6.2500 mg | INTRAMUSCULAR | Status: DC | PRN

## 2017-12-17 MED ORDER — ONDANSETRON HCL 4 MG/2ML IJ SOLN
INTRAMUSCULAR | Status: DC | PRN
  Administered 2017-12-17: 4 mg via INTRAVENOUS

## 2017-12-17 MED ORDER — PROPOFOL 10 MG/ML IV BOLUS
INTRAVENOUS | Status: DC | PRN
  Administered 2017-12-17: 140 mg via INTRAVENOUS

## 2017-12-17 MED ORDER — LACTATED RINGERS IV SOLN
INTRAVENOUS | Status: DC | PRN
  Administered 2017-12-17: 10:00:00 via INTRAVENOUS

## 2017-12-17 MED ORDER — FAMOTIDINE 20 MG PO TABS
20.0000 mg | ORAL_TABLET | Freq: Once | ORAL | Status: AC
  Administered 2017-12-17: 20 mg via ORAL

## 2017-12-17 MED ORDER — NEOMYCIN-POLYMYXIN B GU 40-200000 IR SOLN
Status: DC | PRN
  Administered 2017-12-17: 2 mL

## 2017-12-17 MED ORDER — HYDROCODONE-ACETAMINOPHEN 5-325 MG PO TABS: 1.0000 | ORAL_TABLET | Freq: Four times a day (QID) | ORAL | 0 refills | Status: DC | PRN

## 2017-12-17 MED ORDER — BUPIVACAINE HCL (PF) 0.5 % IJ SOLN
INTRAMUSCULAR | Status: DC | PRN
  Administered 2017-12-17: 10 mL

## 2017-12-17 MED ORDER — FENTANYL CITRATE (PF) 100 MCG/2ML IJ SOLN
INTRAMUSCULAR | Status: AC
Start: 2017-12-17 — End: ?
  Filled 2017-12-17: qty 2

## 2017-12-17 MED ORDER — MIDAZOLAM HCL 2 MG/2ML IJ SOLN
INTRAMUSCULAR | Status: DC | PRN
  Administered 2017-12-17: 2 mg via INTRAVENOUS

## 2017-12-17 MED ORDER — PROMETHAZINE HCL 25 MG/ML IJ SOLN: 6.2500 mg | INTRAMUSCULAR | Status: DC | PRN

## 2017-12-17 MED ORDER — MIDAZOLAM HCL 2 MG/2ML IJ SOLN
INTRAMUSCULAR | Status: AC
  Filled 2017-12-17: qty 2

## 2017-12-17 MED ORDER — FENTANYL CITRATE (PF) 100 MCG/2ML IJ SOLN: 25.0000 ug | INTRAMUSCULAR | Status: DC | PRN

## 2017-12-17 MED ORDER — FAMOTIDINE 20 MG PO TABS
ORAL_TABLET | ORAL | Status: AC
  Administered 2017-12-17: 20 mg via ORAL
  Filled 2017-12-17: qty 1

## 2017-12-17 MED ORDER — LACTATED RINGERS IV SOLN
INTRAVENOUS | Status: DC
  Administered 2017-12-17: 09:00:00 via INTRAVENOUS

## 2017-12-17 MED ORDER — CEFAZOLIN SODIUM-DEXTROSE 2-4 GM/100ML-% IV SOLN
INTRAVENOUS | Status: AC
  Filled 2017-12-17: qty 100

## 2017-12-17 MED ORDER — FENTANYL CITRATE (PF) 100 MCG/2ML IJ SOLN
INTRAMUSCULAR | Status: DC | PRN
  Administered 2017-12-17 (×2): 25 ug via INTRAVENOUS
  Administered 2017-12-17: 50 ug via INTRAVENOUS

## 2017-12-17 MED ORDER — LIDOCAINE HCL (CARDIAC) PF 100 MG/5ML IV SOSY
PREFILLED_SYRINGE | INTRAVENOUS | Status: DC | PRN
  Administered 2017-12-17: 80 mg via INTRAVENOUS

## 2017-12-17 MED ORDER — OXYCODONE HCL 5 MG PO TABS: 5.0000 mg | ORAL_TABLET | Freq: Once | ORAL | Status: DC | PRN

## 2017-12-17 MED ORDER — OXYCODONE HCL 5 MG/5ML PO SOLN: 5.0000 mg | Freq: Once | ORAL | Status: DC | PRN

## 2017-12-17 SURGICAL SUPPLY — 23 items
CANISTER SUCT 1200ML W/VALVE (MISCELLANEOUS) ×2 IMPLANT
CUFF TOURN 18 STER (MISCELLANEOUS) ×1 IMPLANT
ELECT REM PT RETURN 9FT ADLT (ELECTROSURGICAL) ×2
ELECTRODE REM PT RTRN 9FT ADLT (ELECTROSURGICAL) ×1 IMPLANT
GAUZE PETRO XEROFOAM 1X8 (MISCELLANEOUS) ×2 IMPLANT
GAUZE SPONGE 4X4 12PLY STRL (GAUZE/BANDAGES/DRESSINGS) ×2 IMPLANT
GAUZE STRETCH 2X75IN STRL (MISCELLANEOUS) ×2 IMPLANT
GLOVE SURG SYN 9.0  PF PI (GLOVE) ×1
GLOVE SURG SYN 9.0 PF PI (GLOVE) ×1 IMPLANT
GOWN SRG 2XL LVL 4 RGLN SLV (GOWNS) ×1 IMPLANT
GOWN STRL NON-REIN 2XL LVL4 (GOWNS) ×1
GOWN STRL REUS W/ TWL LRG LVL3 (GOWN DISPOSABLE) ×1 IMPLANT
GOWN STRL REUS W/TWL LRG LVL3 (GOWN DISPOSABLE) ×1
KIT TURNOVER KIT A (KITS) ×2 IMPLANT
NDL HYPO 25X1 1.5 SAFETY (NEEDLE) ×1 IMPLANT
NEEDLE HYPO 25X1 1.5 SAFETY (NEEDLE) ×2 IMPLANT
NS IRRIG 500ML POUR BTL (IV SOLUTION) ×2 IMPLANT
PACK EXTREMITY ARMC (MISCELLANEOUS) ×2 IMPLANT
SCALPEL PROTECTED #15 DISP (BLADE) ×2 IMPLANT
STOCKINETTE STRL 6IN 960660 (GAUZE/BANDAGES/DRESSINGS) ×2 IMPLANT
SUT ETHILON 4-0 (SUTURE) ×1
SUT ETHILON 4-0 FS2 18XMFL BLK (SUTURE) ×1
SUTURE ETHLN 4-0 FS2 18XMF BLK (SUTURE) ×1 IMPLANT

## 2017-12-17 NOTE — H&P (Signed)
Reviewed paper H+P, will be scanned into chart. No changes noted.  

## 2017-12-17 NOTE — Anesthesia Post-op Follow-up Note (Signed)
Anesthesia QCDR form completed.        

## 2017-12-17 NOTE — Op Note (Signed)
12/17/2017  10:43 AM  PATIENT:  Doris DecampJanie T Leugers  72 y.o. female  PRE-OPERATIVE DIAGNOSIS:  cyst of joint in right hand  POST-OPERATIVE DIAGNOSIS:  cyst of joint in right hand  PROCEDURE:  Procedure(s): EXCISION METACARPAL MASS (Right)  SURGEON: Leitha SchullerMichael J Inesha Sow, MD  ASSISTANTS: None  ANESTHESIA:   general  EBL:  No intake/output data recorded.  BLOOD ADMINISTERED:none  DRAINS: none   LOCAL MEDICATIONS USED:  MARCAINE     SPECIMEN:  No Specimen  DISPOSITION OF SPECIMEN:  N/A  COUNTS:  YES  TOURNIQUET:   Total Tourniquet Time Documented: Upper Arm (Right) - 9 minutes Total: Upper Arm (Right) - 9 minutes   IMPLANTS: None  DICTATION: .Dragon Dictation patient was brought to the operating room and after adequate general anesthesia was obtained the right arm was prepped and draped you sterile fashion.  After patient identification and timeout procedures were completed, a digital block was placed for postop analgesia using 10 cc of half percent Sensorcaine plain.  The dorsum of the middle finger was then opened with a curvilinear incision centered over the PIP joint and proximal phalanx.  Just under the skin there was a ganglion cyst that was opened and the gelatinous material evacuated out.  The cyst walls were then debrided with use of a rongeur and getting between the extensor tendon centrally and laterally on the ulnar side the joint was debrided with use of this small rongeur to remove bone spur and synovitis that was present along with the root of the cyst.  When this was completed there was excess skin so portion was elliptically excised to get skin closure without a large dead space.  The wound was closed using simple interrupted 4-0 nylon after irrigating the wound followed by Xeroform 4 x 4's and a 2 inch Kling tourniquet was then let down  PLAN OF CARE: Discharge to home after PACU  PATIENT DISPOSITION:  PACU - hemodynamically stable.

## 2017-12-17 NOTE — Anesthesia Preprocedure Evaluation (Signed)
Anesthesia Evaluation  Patient identified by MRN, date of birth, ID band Patient awake    Reviewed: Allergy & Precautions, NPO status , Patient's Chart, lab work & pertinent test results  History of Anesthesia Complications Negative for: history of anesthetic complications  Airway Mallampati: III  TM Distance: >3 FB Neck ROM: Full    Dental no notable dental hx.    Pulmonary neg pulmonary ROS, neg sleep apnea, neg COPD,    breath sounds clear to auscultation- rhonchi (-) wheezing      Cardiovascular hypertension, Pt. on medications (-) CAD, (-) Past MI, (-) Cardiac Stents and (-) CABG  Rhythm:Regular Rate:Normal - Systolic murmurs and - Diastolic murmurs    Neuro/Psych negative neurological ROS  negative psych ROS   GI/Hepatic negative GI ROS, Neg liver ROS,   Endo/Other  neg diabetesHyperthyroidism   Renal/GU negative Renal ROS     Musculoskeletal negative musculoskeletal ROS (+)   Abdominal (+) - obese,   Peds  Hematology negative hematology ROS (+)   Anesthesia Other Findings Past Medical History: No date: Hyperlipidemia No date: Hypertension No date: Hyperthyroidism No date: Positive TB test No date: Thyroid disease   Reproductive/Obstetrics                             Anesthesia Physical Anesthesia Plan  ASA: II  Anesthesia Plan: General   Post-op Pain Management:    Induction: Intravenous  PONV Risk Score and Plan: 2 and Ondansetron and Midazolam  Airway Management Planned: LMA  Additional Equipment:   Intra-op Plan:   Post-operative Plan:   Informed Consent: I have reviewed the patients History and Physical, chart, labs and discussed the procedure including the risks, benefits and alternatives for the proposed anesthesia with the patient or authorized representative who has indicated his/her understanding and acceptance.   Dental advisory given  Plan Discussed  with: CRNA and Anesthesiologist  Anesthesia Plan Comments:         Anesthesia Quick Evaluation

## 2017-12-17 NOTE — Progress Notes (Signed)
Can wiggle fingers on right hand   Capillary refill positive  Skin warm and dry

## 2017-12-17 NOTE — Discharge Instructions (Signed)
Keep arm elevated as much as possible.  Do not worry about working on finger range of motion.  Pain medicine as directed.  Keep bandage clean and dry and reinforce if needed for bloody drainage.

## 2017-12-17 NOTE — Transfer of Care (Signed)
Immediate Anesthesia Transfer of Care Note  Patient: Doris DecampJanie T Mickler  Procedure(s) Performed: EXCISION METACARPAL MASS (Right Hand)  Patient Location: PACU  Anesthesia Type:General  Level of Consciousness: awake  Airway & Oxygen Therapy: Patient Spontanous Breathing and Patient connected to face mask oxygen  Post-op Assessment: Report given to RN  Post vital signs: Reviewed and stable  Last Vitals:  Vitals Value Taken Time  BP 106/65 12/17/2017 10:48 AM  Temp 37 C 12/17/2017 10:48 AM  Pulse 76 12/17/2017 10:48 AM  Resp 17 12/17/2017 10:48 AM  SpO2 100 % 12/17/2017 10:48 AM    Last Pain:  Vitals:   12/17/17 0855  TempSrc: Temporal  PainSc:          Complications: No apparent anesthesia complications

## 2017-12-17 NOTE — Anesthesia Postprocedure Evaluation (Signed)
Anesthesia Post Note  Patient: Doris DecampJanie T [name]  Procedure(s) Performed: EXCISION METACARPAL MASS (Right Hand)  Patient location during evaluation: PACU Anesthesia Type: General Level of consciousness: awake and alert and oriented Pain management: pain level controlled Vital Signs Assessment: post-procedure vital signs reviewed and stable Respiratory status: spontaneous breathing, nonlabored ventilation and respiratory function stable Cardiovascular status: blood pressure returned to baseline and stable Postop Assessment: no signs of nausea or vomiting Anesthetic complications: no     Last Vitals:  Vitals:   12/17/17 1127 12/17/17 1134  BP: 130/60 133/61  Pulse: 78 83  Resp: 10 16  Temp:  (!) 36.2 C  SpO2: 98% 96%    Last Pain:  Vitals:   12/17/17 1134  TempSrc: Temporal  PainSc: 0-No pain                 Tabbitha Janvrin

## 2017-12-17 NOTE — Anesthesia Procedure Notes (Signed)
Procedure Name: LMA Insertion Date/Time: 12/17/2017 10:20 AM Performed by: Eduardo OsierKilduff, Angalina Ante, CRNA Pre-anesthesia Checklist: Timeout performed, Patient being monitored, Suction available, Emergency Drugs available and Patient identified Patient Re-evaluated:Patient Re-evaluated prior to induction Oxygen Delivery Method: Circle system utilized Preoxygenation: Pre-oxygenation with 100% oxygen Induction Type: IV induction LMA: LMA inserted LMA Size: 4.0 Number of attempts: 1 Placement Confirmation: positive ETCO2,  CO2 detector and breath sounds checked- equal and bilateral Tube secured with: Tape

## 2017-12-18 ENCOUNTER — Encounter: Payer: Self-pay | Admitting: Orthopedic Surgery

## 2018-01-11 ENCOUNTER — Encounter: Payer: Self-pay | Admitting: Internal Medicine

## 2018-01-11 ENCOUNTER — Ambulatory Visit: Payer: Medicare Other | Admitting: Internal Medicine

## 2018-01-11 VITALS — BP 140/82 | HR 70 | Ht 63.0 in | Wt 154.0 lb

## 2018-01-11 DIAGNOSIS — E0500 Thyrotoxicosis with diffuse goiter without thyrotoxic crisis or storm: Secondary | ICD-10-CM

## 2018-01-11 LAB — T4, FREE: Free T4: 0.84 ng/dL (ref 0.60–1.60)

## 2018-01-11 LAB — T3, FREE: T3, Free: 3.4 pg/mL (ref 2.3–4.2)

## 2018-01-11 LAB — TSH: TSH: 2.68 u[IU]/mL (ref 0.35–4.50)

## 2018-01-11 NOTE — Progress Notes (Signed)
Patient ID: Doris [name], female   DOB: 1946/01/28, 72 y.o.   MRN: [id number]   HPI  Doris [name] is a 72 y.o.-year-old very pleasant female, initially referred by her PCP, Tacey Ruiz, NP (Dr Barron Alvine [facility]), now returning for f/u for Graves ds. Last visit 1 year ago.  She had finger sx in 11/2017 for a skin cyst >> healing.  She saw Dr. Kirke Corin for palpitations in the past >> all investigations negative >> attributed to stress. Now resolved.   Reviewed and addended history: She had a gastroenteritis on 10/25/2014 >> felt weak after this. She presented to [facility] for the weakness - TSH found low mid-March. She then had the TSH rechecked at a visit with PCP: TSH still low.   She had weakness in the past with her thyrotoxic episodes, including winter 2016 >> TFTs abnormal >> restarted MMI 5 mg daily in 10/2015 >> we were able to decrease the dose to 2.5 mg daily (5 mg qod) in 12/2015.   She is doing well on this dose without complaints and with normal TFTs.  Last TFT check was 3 months ago and we continue the current dose of 2.5 mg daily of methimazole.  Reviewed patient's TFTs-normal: Lab Results  Component Value Date   TSH 3.04 10/02/2017   TSH 4.46 05/26/2017   TSH 2.89 01/09/2017   TSH 1.30 07/14/2016   TSH 2.24 02/21/2016   FREET4 0.79 10/02/2017   FREET4 0.86 05/26/2017   FREET4 0.86 01/09/2017   FREET4 0.85 07/14/2016   FREET4 0.88 02/21/2016  11/21/2014: TSH 0.229 (0.450-4.5), TT4 11.9 (4.5-12)  11/04/2014: TSH 0.326 02/2014: TSH 2.530  No results found for: TSI   05/29/2015: Thyroid uptake and scan: The 24 hour radioactive iodine uptake is equal to 40.9%. There is uniform tracer uptake throughout both lobes. No dominant hot or cold nodule identified. IMPRESSION: 1. Elevated 24 hour radioactive uptake equal 40.9%. 2. Patient's hyperthyroidism may be due to Grave's disease.  Pt denies: - feeling nodules in neck - hoarseness - dysphagia -  choking - SOB with lying down  Pt does have a FH of thyroid ds.  + FH of thyroid cancer in daughter. No h/o radiation tx to head or neck.  No seaweed or kelp. No recent contrast studies. No herbal supplements. No Biotin use. No recent steroids use.   Both her daughters (one in [place] and one near [place]) gave birth last fall - 2 girls.  ROS: Constitutional: no weight gain/no weight loss, no fatigue, no subjective hyperthermia, no subjective hypothermia Eyes: no blurry vision, no xerophthalmia ENT: no sore throat, + see HPI Cardiovascular: no CP/no SOB/no palpitations/no leg swelling Respiratory: no cough/no SOB/no wheezing Gastrointestinal: no N/no V/no D/no C/no acid reflux Musculoskeletal: no muscle aches/no joint aches Skin: no rashes, no hair loss Neurological: no tremors/no numbness/no tingling/no dizziness  I reviewed pt's medications, allergies, PMH, social hx, family hx, and changes were documented in the history of present illness. Otherwise, unchanged from my initial visit note.  Past Medical History:  Diagnosis Date  . Hyperlipidemia   . Hypertension   . Hyperthyroidism   . Positive TB test   . Thyroid disease    Past Surgical History:  Procedure Laterality Date  . CESAREAN SECTION     x 2  . EXCISION METACARPAL MASS Right 12/17/2017   Procedure: EXCISION METACARPAL MASS;  Surgeon: Kennedy Bucker, MD;  Location: [facility] ORS;  Service: Orthopedics;  Laterality: Right;  History   Social History  . Marital Status: Married    Spouse Name: N/A  . Number of Children: 5   Occupational History  . Retired Runner, broadcasting/film/video   Social History Main Topics  . Smoking status: Never Smoker   . Smokeless tobacco: Not on file  . Alcohol Use: No  . Drug Use: No   Current Outpatient Medications on File Prior to Visit  Medication Sig Dispense Refill  . acetaminophen (TYLENOL) 325 MG tablet Take 325 mg by mouth every 6 (six) hours as needed for headache.     Marland Kitchen amLODipine (NORVASC)  5 MG tablet TAKE 1 TABLET BY MOUTH EVERY DAY 30 tablet 3  . Carboxymethylcellul-Glycerin (LUBRICATING EYE DROPS OP) Place 2 drops into both eyes daily as needed (dry eyes).    Marland Kitchen HYDROcodone-acetaminophen (NORCO) 5-325 MG tablet Take 1 tablet by mouth every 6 (six) hours as needed for moderate pain. 15 tablet 0  . losartan-hydrochlorothiazide (HYZAAR) 100-25 MG tablet TAKE 1 TABLET BY MOUTH DAILY 90 tablet 2  . methimazole (TAPAZOLE) 5 MG tablet TAKE 1/2 TABLET BY MOUTH EVERY DAY (Patient taking differently: Take 5 mg by mouth every other day) 90 tablet 0  . Multiple Vitamin (MULTIVITAMIN WITH MINERALS) TABS tablet Take 1 tablet by mouth daily.    . rosuvastatin (CRESTOR) 5 MG tablet Take 5 mg by mouth at bedtime.      No current facility-administered medications on file prior to visit.    No Known Allergies   FH: - HTN in M - HL in M, sister - also see HPI  PE: BP 140/82 (BP Location: Left Arm, Patient Position: Sitting, Cuff Size: Normal)   Pulse 70   Ht  (1.6 m)   Wt 154 lb (69.9 kg)   SpO2 98%   BMI 27.28 kg/m   Body mass index is 27.28 kg/m. Wt Readings from Last 3 Encounters:  01/11/18 154 lb (69.9 kg)  12/17/17 156 lb (70.8 kg)  12/08/17 156 lb (70.8 kg)   Constitutional: overweight, in NAD Eyes: PERRLA, EOMI, no exophthalmos ENT: moist mucous membranes, no thyromegaly, no cervical lymphadenopathy Cardiovascular: RRR, No RG, + 1/6 SEM Respiratory: CTA B Gastrointestinal: abdomen soft, NT, ND, BS+ Musculoskeletal: no deformities, strength intact in all 4 Skin: moist, warm, no rashes Neurological: no tremor with outstretched hands, DTR normal in all 4  ASSESSMENT: 1. Graves ds.   PLAN:  1. Patient with history of Graves' disease, with improved TFTs on methimazole.  We are maintaining her on the low-dose of methimazole, 2.5 mg daily, which works well for her.  She feels great and has no complaints.  At last visit, she had some palpitations after caffeine, which  resolved.  She denies any weight loss, heat intolerance, tremors, or new episodes of palpitations. - We again discussed that the new studies show that such a small dose of methimazole is safe and effective against Graves' recurrence - We will recheck her TFTs today and check TSI antibodies - explained that we check the ds. Activity by checking these - No need for beta-blockers at this time since she is not tachycardic or tremulous - She refused to join my chart - I will see her back in a year, but possibly earlier for labs.  - time spent with the patient: 15 minutes, of which >50% was spent in obtaining information about her symptoms, reviewing her previous labs, evaluations, and treatments, counseling her about her condition (please see the discussed topics above), and developing a  plan to further investigate and treat it.  Needs refills.   Office Visit on 01/11/2018  Component Date Value Ref Range Status  . TSH 01/11/2018 2.68  0.35 - 4.50 uIU/mL Final  . T3, Free 01/11/2018 3.4  2.3 - 4.2 pg/mL Final  . TSI 01/11/2018 108  <140 % baseline Final  . Free T4 01/11/2018 0.84  0.60 - 1.60 ng/dL Final  All labs are great, including the Graves Ab's!  Carlus Pavlov, MD PhD [facility] End[facility]

## 2018-01-11 NOTE — Patient Instructions (Signed)
Please stop at the lab.  Please continue 2.5 mg of methimazole daily.  Please return to see me in 1 year.

## 2018-01-14 LAB — THYROID STIMULATING IMMUNOGLOBULIN: TSI: 108 % baseline (ref <140)

## 2018-01-14 MED ORDER — METHIMAZOLE 5 MG PO TABS: 2.5000 mg | ORAL_TABLET | Freq: Every day | ORAL | 1 refills | Status: DC

## 2018-02-02 ENCOUNTER — Other Ambulatory Visit: Payer: Self-pay | Admitting: Cardiovascular Disease

## 2018-03-14 ENCOUNTER — Other Ambulatory Visit: Payer: Self-pay | Admitting: Internal Medicine

## 2018-03-30 ENCOUNTER — Other Ambulatory Visit: Payer: Self-pay | Admitting: Cardiovascular Disease

## 2018-03-31 ENCOUNTER — Telehealth: Payer: Self-pay | Admitting: Cardiovascular Disease

## 2018-03-31 NOTE — Telephone Encounter (Signed)
Lmov for patient to call back to schedule appointment

## 2018-03-31 NOTE — Telephone Encounter (Signed)
-----   Message from Festus AloeSharon G Crespo, CMA sent at 03/30/2018 10:29 AM EDT ----- Please contact patient for a follow up with Dr. Kirke CorinArida. She was due to come back in July 2019.  Thanks, Jasmine DecemberSharon

## 2018-04-06 NOTE — Telephone Encounter (Signed)
Attempted to contact patient to schedule °Not available/No VM °

## 2018-04-13 NOTE — Telephone Encounter (Signed)
Attempted to contact patient to schedule Not available/No VM

## 2018-04-16 NOTE — Telephone Encounter (Signed)
Patient scheduled 05/17/18

## 2018-05-17 ENCOUNTER — Encounter: Payer: Self-pay | Admitting: Nurse Practitioner

## 2018-05-17 ENCOUNTER — Ambulatory Visit: Payer: Medicare Other | Admitting: Nurse Practitioner

## 2018-05-17 VITALS — BP 136/74 | HR 71 | Ht 63.0 in | Wt 156.5 lb

## 2018-05-17 DIAGNOSIS — I10 Essential (primary) hypertension: Secondary | ICD-10-CM

## 2018-05-17 DIAGNOSIS — R002 Palpitations: Secondary | ICD-10-CM | POA: Diagnosis not present

## 2018-05-17 NOTE — Patient Instructions (Signed)
Medication Instructions:  Your physician recommends that you continue on your current medications as directed. Please refer to the Current Medication list given to you today.   Labwork: none  Testing/Procedures: none  Follow-Up: Your physician recommends that you schedule a follow-up appointment in: as needed with Dr Kirke CorinArida.

## 2018-05-17 NOTE — Progress Notes (Signed)
Office Visit    Patient Name: Doris DecampJanie T Pylant Date of Encounter: 05/17/2018  Primary Care Provider[facility]  [facility] Primary Cardiologist:  Lorine BearsMuhammad Arida, MD  Chief Complaint    72 year old female with a history of hypertension and hyperthyroidism, who presents for follow-up related to palpitations.  Past Medical History    Past Medical History:  Diagnosis Date  . Graves disease   . Hyperlipidemia   . Hypertension   . Hyperthyroidism   . Palpitations    a. 05/2017 Echo: EF 60-65%, no rwma, Gr1 DD. PASP 38mmHg; b. 05/2017 48h Holter: Rare PACs and PVCs. 11 beat run of SVT w/ aberrancy.  Marland Kitchen. Positive TB test    Past Surgical History:  Procedure Laterality Date  . CESAREAN SECTION     x 2  . EXCISION METACARPAL MASS Right 12/17/2017   Procedure: EXCISION METACARPAL MASS;  Surgeon: Kennedy BuckerMenz, Michael, MD;  Location: [facility] ORS;  Service: Orthopedics;  Laterality: Right;    Allergies  No Known Allergies  History of Present Illness    72 year old female with a prior history of hypertension, Graves' disease, hyperlipidemia, and palpitations.  She was last seen in clinic in July 2018.  She subsequently developed palpitations and had a monitor placed which showed an 11 beat run of SVT with aberrancy.  A follow-up echocardiogram showed normal LV function with grade 1 diastolic dysfunction.  She was advised that if she wished, we could initiate beta-blocker therapy however, she wished to defer.  She says that the day that she wore the monitor, her daughter gave birth and she thinks that the reason she had palpitations.  Over the past year, she has done exceptionally well.  She tries to get in about 5000 steps a day and is able to do this without experiencing chest pain, dyspnea, or palpitations.  She checks her blood pressure daily at home and it typically runs in the 120s.  She denies PND, orthopnea, dizziness, syncope, edema, or early satiety.  Home Medications    Prior to Admission  medications   Medication Sig Start Date End Date Taking? Authorizing Provider  acetaminophen (TYLENOL) 325 MG tablet Take 325 mg by mouth every 6 (six) hours as needed for headache.     [provider]  amLODipine (NORVASC) 5 MG tablet TAKE 1 TABLET BY MOUTH EVERY DAY 04/06/17   Iran OuchArida, Muhammad A, MD  amLODipine (NORVASC) 5 MG tablet TAKE 1 TABLET BY MOUTH EVERY DAY 03/30/18   Iran OuchArida, Muhammad A, MD  Carboxymethylcellul-Glycerin (LUBRICATING EYE DROPS OP) Place 2 drops into both eyes daily as needed (dry eyes).    [provider]  HYDROcodone-acetaminophen (NORCO) 5-325 MG tablet Take 1 tablet by mouth every 6 (six) hours as needed for moderate pain. 12/17/17   Kennedy BuckerMenz, Michael, MD  losartan-hydrochlorothiazide (HYZAAR) 100-25 MG tablet TAKE 1 TABLET BY MOUTH DAILY 02/02/18   Iran OuchArida, Muhammad A, MD  methimazole (TAPAZOLE) 5 MG tablet Take 0.5 tablets (2.5 mg total) by mouth daily. 01/14/18   Carlus PavlovGherghe, Cristina, MD  methimazole (TAPAZOLE) 5 MG tablet TAKE 1/2 TABLET BY MOUTH EVERY DAY 03/15/18   Carlus PavlovGherghe, Cristina, MD  Multiple Vitamin (MULTIVITAMIN WITH MINERALS) TABS tablet Take 1 tablet by mouth daily.    [provider]  rosuvastatin (CRESTOR) 5 MG tablet Take 5 mg by mouth at bedtime.  04/23/17 04/23/18  [provider]    Review of Systems    She denies chest pain, palpitations, dyspnea, pnd, orthopnea, n, v, dizziness, syncope, edema,  weight gain, or early satiety.  All other systems reviewed and are otherwise negative except as noted above.  Physical Exam    VS:  BP 136/74 (BP Location: Left Arm, Patient Position: Sitting, Cuff Size: Normal)   Pulse 71   Ht 5\' 3"  (1.6 m)   Wt 156 lb 8 oz (71 kg)   BMI 27.72 kg/m  , BMI Body mass index is 27.72 kg/m. GEN: Well nourished, well developed, in no acute distress. HEENT: normal. Neck: Supple, no JVD, carotid bruits, or masses. Cardiac: RRR, no murmurs, rubs, or gallops. No clubbing, cyanosis, edema.  Radials/DP/PT 2+  and equal bilaterally.  Respiratory:  Respirations regular and unlabored, clear to auscultation bilaterally. GI: Soft, nontender, nondistended, BS + x 4. MS: no deformity or atrophy. Skin: warm and dry, no rash. Neuro:  Strength and sensation are intact. Psych: Normal affect.  Accessory Clinical Findings    None  Assessment & Plan    1.  Essential hypertension: Stable on calcium channel blocker, ARB, and diuretic therapy at home.  She typically runs in the 120s.  This is followed closely by primary care.  No changes today.  2.  Palpitations: She wore a Holter last year which showed an 11 beat run of SVT with aberrancy.  She says this occurred in the day her daughter gave birth and she has not had palpitations since.  She was not interested in beta-blocker when offered last October.  No changes today.  3.  Hyperthyroidism: On methimazole therapy.  She is followed closely by primary care.  4.  Hyperlipidemia: LDL was 45 in June.  This is followed by primary care and she remains on low-dose statin therapy.  5.  Disposition: Follow-up with Dr. Kirke Corin as needed.  Nicolasa Ducking, NP 05/17/2018, 2:52 PM

## 2018-05-31 ENCOUNTER — Other Ambulatory Visit: Payer: Self-pay | Admitting: Cardiovascular Disease

## 2018-06-15 ENCOUNTER — Encounter

## 2018-06-15 ENCOUNTER — Ambulatory Visit: Payer: Medicare Other | Admitting: Cardiovascular Disease

## 2018-06-18 ENCOUNTER — Telehealth: Payer: Self-pay | Admitting: Cardiovascular Disease

## 2018-06-18 NOTE — Telephone Encounter (Signed)
Patient calling just a little concern She states she was traveling in the car and was trying to fall asleep  She had a slight pain in her Left arm and she took a tylenol and it went away  Just wants to make sure there is nothing to be concerned about She states she's not had that since taking the medication   Please call back

## 2018-06-18 NOTE — Telephone Encounter (Signed)
I attempted to call the patient back at [phone]. No answer x multiple rings- message stating "enter remote access code."   I attempted to call the patient at [phone]. No answer x multiple rings- message stating "the wireless customer you are trying to reach is unavailable, try your call back later."

## 2018-06-21 NOTE — Telephone Encounter (Signed)
Returned the call to the patient. She stated that she had left arm pain on Thursday that was relieved with Tylenol. She denies shortness of breath, nausea, and chest pain. She has not had the pain since then. She did state that she was carrying around a heavy bag that day in her left arm and thinks she may have hurt it.  She will call back if the pain returns and is associated with chest pian and shortness of breath.

## 2018-06-21 NOTE — Telephone Encounter (Signed)
Patient returning call see note below  °

## 2018-07-29 ENCOUNTER — Other Ambulatory Visit: Payer: Self-pay | Admitting: Cardiovascular Disease

## 2018-08-02 ENCOUNTER — Telehealth: Payer: Self-pay | Admitting: Cardiovascular Disease

## 2018-08-02 MED ORDER — LOSARTAN POTASSIUM-HCTZ 50-12.5 MG PO TABS: 2.0000 | ORAL_TABLET | Freq: Every day | ORAL | 0 refills | Status: DC

## 2018-08-02 NOTE — Telephone Encounter (Signed)
Pt states her Losartan is on back order and her pharmacy can only get the 50 mg tablet. Please call to discuss . Pt states she has 1 pill left

## 2018-08-02 NOTE — Telephone Encounter (Signed)
The patient called in stating that the Hyzaar 100-25 mg was in a shortage. A call was placed to the pharmacy. They stated that they had Hyzaar 50-12.5mg  so a prescription was sent for Hyzaar 50-12.5 mg (two pills) daily.  The patient has been called and instructed to take 2 pills now to equal her former dose.

## 2018-09-08 ENCOUNTER — Other Ambulatory Visit: Payer: Self-pay | Admitting: Family Medicine

## 2018-09-08 ENCOUNTER — Other Ambulatory Visit: Payer: Self-pay | Admitting: Family

## 2018-09-08 DIAGNOSIS — Z1231 Encounter for screening mammogram for malignant neoplasm of breast: Secondary | ICD-10-CM

## 2018-10-29 ENCOUNTER — Other Ambulatory Visit: Payer: Self-pay | Admitting: Cardiovascular Disease

## 2018-11-23 ENCOUNTER — Other Ambulatory Visit: Payer: Self-pay

## 2018-11-23 ENCOUNTER — Telehealth: Payer: Self-pay | Admitting: Cardiovascular Disease

## 2018-11-23 ENCOUNTER — Other Ambulatory Visit: Payer: Self-pay | Admitting: *Deleted

## 2018-11-23 MED ORDER — AMLODIPINE BESYLATE 5 MG PO TABS: 5.0000 mg | ORAL_TABLET | Freq: Every day | ORAL | 3 refills | Status: DC

## 2018-11-23 NOTE — Telephone Encounter (Signed)
Requested Prescriptions   Signed Prescriptions Disp Refills  . amLODipine (NORVASC) 5 MG tablet 30 tablet 3    Sig: Take 1 tablet (5 mg total) by mouth daily.    Authorizing Provider: ARIDA, MUHAMMAD A    Ordering User: Saxon Barich C    

## 2018-11-23 NOTE — Telephone Encounter (Signed)
*  STAT* If patient is at the pharmacy, call can be transferred to refill team.   1. Which medications need to be refilled? (please list name of each medication and dose if known) Amlodipine   2. Which pharmacy/location (including street and city if local pharmacy) is medication to be sent to? [facility]  3. Do they need a 30 day or 90 day supply? 90

## 2018-11-26 ENCOUNTER — Other Ambulatory Visit: Payer: Self-pay | Admitting: Cardiovascular Disease

## 2019-01-12 ENCOUNTER — Ambulatory Visit: Payer: Medicare Other | Admitting: Internal Medicine

## 2019-01-24 ENCOUNTER — Other Ambulatory Visit: Payer: Self-pay | Admitting: Cardiovascular Disease

## 2019-01-24 ENCOUNTER — Encounter: Payer: Self-pay | Admitting: Internal Medicine

## 2019-01-24 ENCOUNTER — Other Ambulatory Visit: Payer: Self-pay

## 2019-01-24 ENCOUNTER — Ambulatory Visit (INDEPENDENT_AMBULATORY_CARE_PROVIDER_SITE_OTHER): Payer: Medicare Other | Admitting: Internal Medicine

## 2019-01-24 DIAGNOSIS — E0500 Thyrotoxicosis with diffuse goiter without thyrotoxic crisis or storm: Secondary | ICD-10-CM | POA: Diagnosis not present

## 2019-01-24 MED ORDER — METHIMAZOLE 5 MG PO TABS: 2.5000 mg | ORAL_TABLET | Freq: Every day | ORAL | 3 refills | Status: DC

## 2019-01-24 NOTE — Progress Notes (Signed)
Patient ID: Doris DecampJanie T Bova, female   DOB: 1946/05/05, 73 y.o.   MRN: [id number]  Patient location: Home My location: Office  I connected with the patient on 01/24/19 at  2:53 PM EDT by a video enabled telemedicine application and verified that I am speaking with the correct person.   I discussed the limitations of evaluation and management by telemedicine and the availability of in person appointments. The patient expressed understanding and agreed to proceed.   Details of the encounter are shown below.  HPI  Doris [name] is a 73 y.o.-year-old very pleasant female, initially referred by her PCP, Tacey RuizEmily Dolleschel, NP (Dr Barron Alvineavid Gibson [facility]), now presenting for follow-up for Graves ds. Last visit 1 year ago.  She previously saw Dr. Kirke CorinArida for palpitations 2 years ago.  She still has occasionally, when she wakes up, but not frequently.  Reviewed and addended history: She had a gastroenteritis on 10/25/2014 >> felt weak after this. [facility] - TSH found low mid-March. She then had the TSH rechecked at a visit with PCP: TSH still low.   She had weakness in the past with her thyrotoxic episodes, including winter 2016 >> TFTs abnormal >> restarted MMI 5 mg daily in 10/2015 >> we were able to decrease the dose to 2.5 mg daily (5 mg qod) in 12/2015.   Latest TFTs in 12/2017 were normal on 2.5 mg of methimazole daily.  Reviewed patient's TFTs - Normal: Lab Results  Component Value Date   TSH 2.68 01/11/2018   TSH 3.04 10/02/2017   TSH 4.46 05/26/2017   TSH 2.89 01/09/2017   TSH 1.30 07/14/2016   FREET4 0.84 01/11/2018   FREET4 0.79 10/02/2017   FREET4 0.86 05/26/2017   FREET4 0.86 01/09/2017   FREET4 0.85 07/14/2016  11/21/2014: TSH 0.229 (0.450-4.5), TT4 11.9 (4.5-12)  11/04/2014: TSH 0.326 02/2014: TSH 2.530  Her TSI level was normal at last check: Lab Results  Component Value Date   TSI 108 01/11/2018    05/29/2015: Thyroid uptake  and scan: The 24 hour radioactive iodine uptake is equal to 40.9%. There is uniform tracer uptake throughout both lobes. No dominant hot or cold nodule identified. IMPRESSION: 1. Elevated 24 hour radioactive uptake equal 40.9%. 2. Patient's hyperthyroidism may be due to Grave's disease.  Pt denies: - feeling nodules in neck - hoarseness - dysphagia - choking - SOB with lying down  Pt does have a FH of thyroid ds.  +FH of thyroid cancer in daughter.No h/o radiation tx to head or neck.  No seaweed or kelp. No recent contrast studies. No herbal supplements. No Biotin use. No recent steroids use.   Both her daughters ([place] in [place] gave birth last fall - 2 girls.  ROS: Constitutional: no weight gain/+ weight loss (5 lbs), no fatigue, no subjective hyperthermia, no subjective hypothermia Eyes: no blurry vision, no xerophthalmia ENT: no sore throat, + see HPI Cardiovascular: no CP/no SOB/+ palpitations/no leg swelling Respiratory: no cough/no SOB/no wheezing Gastrointestinal: no N/no V/no D/no C/no acid reflux Musculoskeletal: no muscle aches/no joint aches Skin: no rashes, no hair loss Neurological: no tremors/no numbness/no tingling/no dizziness  I reviewed pt's medications, allergies, PMH, social hx, family hx, and changes were documented in the history of present illness. Otherwise, unchanged from my initial visit note.  Past Medical History:  Diagnosis Date  . Graves disease   . Hyperlipidemia   . Hypertension   . Hyperthyroidism   .  Palpitations    a. 05/2017 Echo: EF 60-65%, no rwma, Gr1 DD. PASP ; b. 05/2017 48h Holter: Rare PACs and PVCs. 11 beat run of SVT w/ aberrancy.  Marland Kitchen Positive TB test    Past Surgical History:  Procedure Laterality Date  . CESAREAN SECTION     x 2  . EXCISION METACARPAL MASS Right 12/17/2017   Procedure: EXCISION METACARPAL MASS;  Surgeon: Kennedy Bucker, MD;  Location: [facility] ORS;  Service: Orthopedics;  Laterality:  Right;    History   Social History  . Marital Status: Married    Spouse Name: N/A  . Number of Children: 5   Occupational History  . Retired Runner, broadcasting/film/video   Social History Main Topics  . Smoking status: Never Smoker   . Smokeless tobacco: Not on file  . Alcohol Use: No  . Drug Use: No   Current Outpatient Medications on File Prior to Visit  Medication Sig Dispense Refill  . acetaminophen (TYLENOL) 325 MG tablet Take 325 mg by mouth every 6 (six) hours as needed for headache.     Marland Kitchen amLODipine (NORVASC) 5 MG tablet TAKE 1 TABLET BY MOUTH DAILY 30 tablet 5  . Carboxymethylcellul-Glycerin (LUBRICATING EYE DROPS OP) Place 2 drops into both eyes daily as needed (dry eyes).    Marland Kitchen losartan-hydrochlorothiazide (HYZAAR) 50-12.5 MG tablet TAKE 2 TABLETS BY MOUTH DAILY 180 tablet 0  . methimazole (TAPAZOLE) 5 MG tablet TAKE 1/2 TABLET BY MOUTH EVERY DAY 90 tablet 0  . Multiple Vitamin (MULTIVITAMIN WITH MINERALS) TABS tablet Take 1 tablet by mouth daily.    . rosuvastatin (CRESTOR) 5 MG tablet Take 5 mg by mouth at bedtime.      No current facility-administered medications on file prior to visit.    No Known Allergies   FH: - HTN in M - HL in M, sister - also see HPI  PE: There were no vitals taken for this visit. There is no height or weight on file to calculate BMI. Wt Readings from Last 3 Encounters:  05/17/18 156 lb 8 oz (71 kg)  01/11/18 154 lb (69.9 kg)  12/17/17 156 lb (70.8 kg)   Constitutional:  in NAD  The physical exam was not performed (virtual visit).  ASSESSMENT: 1. Graves ds.   PLAN:  1. Patient with history of Graves' disease, with improved TFTs on methimazole.  We are maintaining her on the lowest methimazole dose, 2.5 mg daily, which works well for her.  She feels great and has no complaints.  She denies any weight loss, heat intolerance, tremors, but has occasional palpitations especially when she wakes up after a bad dream. -We again discussed that current  studies show that such a small dose of methimazole is safe and effective against Graves' disease recurrence -Reviewed latest TFTs from last year and these were completely normal, as were her TSI antibodies. -We will recheck her TFTs when she returns to the clinic (now coronavirus pandemic) or at next visit with PCP (01/2019) - we may need to adjust the MMI dose afterward the results are back -She does not require beta-blockers -She refuses to join my chart -I will see her back in a year but earlier for labs.  - time spent with the patient: 15 minutes, of which >50% was spent in obtaining information about her symptoms, reviewing her previous labs, evaluations, and treatments, counseling her about her condition (please see the discussed topics above), and developing a plan to further investigate and treat it.  Needs refills.  Philemon Kingdom, MD PhD [facility] Endocrinology

## 2019-01-24 NOTE — Patient Instructions (Signed)
Please continue methimazole 2.5 mg daily.  Let us check another set of thyroid tests in the next 1 to 3 months.  Please come back for a follow-up appointment in 1 year.

## 2019-02-10 ENCOUNTER — Telehealth: Payer: Self-pay | Admitting: Internal Medicine

## 2019-02-10 NOTE — Telephone Encounter (Signed)
Noted.  Dr. Cruzita Lederer will review next week.

## 2019-02-10 NOTE — Telephone Encounter (Signed)
Patient called to advise her PCP did labs and advised that her TSH level was 6.6

## 2019-02-14 NOTE — Telephone Encounter (Signed)
Please advise her to stop MMI and come back for labs in 4-5 weeks (can you please order a TSH, fT4 and fT3?).

## 2019-02-15 ENCOUNTER — Other Ambulatory Visit: Payer: Self-pay

## 2019-02-15 DIAGNOSIS — E0500 Thyrotoxicosis with diffuse goiter without thyrotoxic crisis or storm: Secondary | ICD-10-CM

## 2019-02-15 NOTE — Telephone Encounter (Signed)
Labs have been ordered. Could you please call and schedule pt for 4-5 weeks from now for labs?

## 2019-02-15 NOTE — Telephone Encounter (Signed)
Forwarding

## 2019-03-07 ENCOUNTER — Other Ambulatory Visit: Payer: Medicare Other

## 2019-03-09 ENCOUNTER — Other Ambulatory Visit (INDEPENDENT_AMBULATORY_CARE_PROVIDER_SITE_OTHER): Payer: Medicare Other

## 2019-03-09 ENCOUNTER — Other Ambulatory Visit: Payer: Self-pay

## 2019-03-09 ENCOUNTER — Other Ambulatory Visit: Payer: Self-pay | Admitting: Internal Medicine

## 2019-03-09 DIAGNOSIS — E0500 Thyrotoxicosis with diffuse goiter without thyrotoxic crisis or storm: Secondary | ICD-10-CM | POA: Diagnosis not present

## 2019-03-09 LAB — T4, FREE: Free T4: 0.72 ng/dL (ref 0.60–1.60)

## 2019-03-09 LAB — T3, FREE: T3, Free: 2.8 pg/mL (ref 2.3–4.2)

## 2019-03-09 LAB — TSH: TSH: 1.78 u[IU]/mL (ref 0.35–4.50)

## 2019-03-14 ENCOUNTER — Other Ambulatory Visit: Payer: Medicare Other

## 2019-03-23 ENCOUNTER — Other Ambulatory Visit: Payer: Self-pay | Admitting: Cardiovascular Disease

## 2019-03-23 ENCOUNTER — Telehealth: Payer: Self-pay

## 2019-03-23 MED ORDER — AMLODIPINE BESYLATE 5 MG PO TABS: 5.0000 mg | ORAL_TABLET | Freq: Every day | ORAL | 0 refills | Status: DC

## 2019-03-23 NOTE — Telephone Encounter (Signed)
°*  STAT* If patient is at the pharmacy, call can be transferred to refill team.   1. Which medications need to be refilled? (please list name of each medication and dose if known) Amlodipine 5 mg daily  2. Which pharmacy/location (including street and city if local pharmacy) is medication to be sent to? [facility] in [place]  3. Do they need a 30 day or 90 day supply? 90  Would like all future meds in a 90 day supply

## 2019-03-23 NOTE — Telephone Encounter (Signed)
Rx for amlodipine sent to Pharmacy.  Pt needs to keep appointment for further refills.

## 2019-03-23 NOTE — Telephone Encounter (Signed)
RX sent to pharmacy pt to keep appointment for further refills.

## 2019-03-28 ENCOUNTER — Other Ambulatory Visit: Payer: Self-pay | Admitting: Cardiovascular Disease

## 2019-03-31 ENCOUNTER — Telehealth: Payer: Self-pay | Admitting: Internal Medicine

## 2019-03-31 ENCOUNTER — Telehealth: Payer: Self-pay | Admitting: Cardiovascular Disease

## 2019-03-31 NOTE — Telephone Encounter (Signed)
Please advise if you wish to see this pt to address her concern or would prefer her to follow up with PCP

## 2019-03-31 NOTE — Telephone Encounter (Signed)
Patient wants nurse visit to check ekg prior to upcoming appt.   She thinks sluggishness htn could be related to thyroid or possible stress and wants to rule out arrhythmia asap .

## 2019-03-31 NOTE — Telephone Encounter (Signed)
Patient was informed by her PCP to call us and inform that she has been feeling very tired more recently, if this does not involve her thyroid patient will have an appointment with her PCP on Friday   Please Advise, Thanks

## 2019-04-01 NOTE — Telephone Encounter (Signed)
She should check her blood pressure twice or 3 times a week and bring the readings to the office visit.

## 2019-04-01 NOTE — Telephone Encounter (Signed)
Returned call to patient.   Pt reports inc stress and upward trend of BP. Last night was 130/69, HR 53. Another time SBP was in mid 140s.  One episode of palpitations after waking from nightmare.   HR has been stable 53-62. No chest pain or SOB, denies headaches.   Pt reports feeling well this am. She requested earlier date for physical if possible. I was able to move appt to aug 20th.   Pt appreciative. I asked pt to call us back if she began having new sx or worsening HTN or more freq episodes of palpitations. She agreed.

## 2019-04-01 NOTE — Telephone Encounter (Signed)
Called pt back with update from Dr. Fletcher Anon, pt reports that she will be sure to bring readings with her to next OV.   Advised pt to call for any further questions or concerns.

## 2019-04-01 NOTE — Telephone Encounter (Signed)
Called pt and made her aware of Dr. Quin Hoop advice. Verbalized acceptance and understanding.

## 2019-04-11 ENCOUNTER — Encounter: Payer: Self-pay | Admitting: Internal Medicine

## 2019-04-11 ENCOUNTER — Telehealth: Payer: Self-pay | Admitting: Cardiovascular Disease

## 2019-04-11 NOTE — Telephone Encounter (Signed)
Patient requesting a call back from the nurse regarding medication changes. Patient was told if she did not receive a callback that she can go over all concerns with provider at appointment tomorrow. Please advise

## 2019-04-11 NOTE — Progress Notes (Signed)
Cardiology Office Note    Date:  04/12/2019   ID:  Doris [name], DOB 1946/06/21, MRN [id number]  PCP:  [facility]  Cardiologist:  Lorine BearsMuhammad Arida, MD  Electrophysiologist:  None   Chief Complaint: Follow-up  History of Present Illness:   Doris [name] is a 73 y.o. female with history of recently diagnosed A. [facility] on [facility], paroxysmal SVT, hypertension, hyperlipidemia, and hyperthyroidism who presents for evaluation of elevated BP.  Patient was seen in late 2018 for evaluation of palpitations with 48-hour monitor showing normal sinus rhythm with rare PACs and PVCs with an 11 beat run of SVT with aberrancy.  Follow-up echo in 05/2017 showed normal LV systolic function with grade 1 diastolic dysfunction.  At that time, we advised patient she could start beta-blocker if she wanted, however she declined.  Patient felt like the reason she had the palpitations was in the setting of her daughter's pregnancy/birth of grandchild.  She was most recently seen in the office in 04/2018 and had done exceptionally well over the prior year.  She indicated at that time her blood pressure typically ran in the 120s and she attempted to get approximately 5000 steps in per day and was asymptomatic with this.  Patient contacted our office earlier this month noting increased stress with upper trend in BP with readings in the 130s to 140s with heart rates in the 50s to low 60s bpm.  In this setting, she also felt increased fatigue and requested a follow-up cardiology appointment.  Following this, the patient was [facility] 04[facility]08/2019 with worsening palpitations that began earlier that day with EKG showing A. fib with RVR, 169 bpm, normal axis, mild anterolateral ST depression noted on ED read (patient [facility] in [facility] which does not show A. fib with RVR, 169 bpm, normal axis, mild anterolateral ST-T changes).  Chest x-ray showed no acute abnormality.  Labs  were unrevealing as outlined below, with a troponin less than 0.03.  She was given Cardizem in the ED without much improvement.  Given this, she was given metoprolol with improvement in heart rate to the low 100s to 130s bpm and was asymptomatic.  She was started on Lopressor 12.5 mg twice daily along with Eliquis 5 mg twice daily and advised to follow-up as outpatient.  Patient comes in doing well today.  She has continued to note intermittent palpitations, last occurring around 2 PM on 04/11/2019.  She has continued to take Lopressor 12.5 mg twice daily along with Eliquis 5 mg twice daily.  Heart rate review from patient's log shows around 2 PM on Saturday, 8/15 her heart rates improved from the 130s to 80s to 90s bpm, possibly indicating when she converted back to sinus rhythm.  Since then, heart rates have ranged from the 60s to 80s bpm.  She denies any chest pain, shortness of breath, dizziness, presyncope, or syncope.  No lower extremity swelling, abdominal tension, orthopnea, PND, early satiety.  No falls since she was last seen.  No BRBPR or melena.  She denies her husband ever telling her that she snores or wakes up with apneic episodes.  Blood pressure has ranged from the 120s to 140s systolic.   Labs: 03/2019 - WBC 11.2, Hgb 13.7, PLT 245, potassium 3.6, serum creatinine 1.1, AST/ALT normal, albumin 4.9, TSH normal, free T4 normal, free T3 normal 01/2019 - total cholesterol 122, triglyceride 155, HDL 50, LDL  41   Past Medical History:  Diagnosis Date   Graves disease    Hyperlipidemia    Hypertension    Hyperthyroidism    Palpitations    a. 05/2017 Echo: EF 60-65%, no rwma, Gr1 DD. PASP 38mmHg; b. 05/2017 48h Holter: Rare PACs and PVCs. 11 beat run of SVT w/ aberrancy.   Positive TB test     Past Surgical History:  Procedure Laterality Date   CESAREAN SECTION     x 2   EXCISION METACARPAL MASS Right 12/17/2017   Procedure: EXCISION METACARPAL MASS;  Surgeon: Kennedy BuckerMenz, Michael,  MD;  Location: [facility] ORS;  Service: Orthopedics;  Laterality: Right;    Current Medications: Current Meds  Medication Sig   acetaminophen (TYLENOL) 325 MG tablet Take 325 mg by mouth every 6 (six) hours as needed for headache.    amLODipine (NORVASC) 5 MG tablet TAKE 1 TABLET(5 MG) BY MOUTH DAILY   Carboxymethylcellul-Glycerin (LUBRICATING EYE DROPS OP) Place 2 drops into both eyes daily as needed (dry eyes).   ELIQUIS 5 MG TABS tablet Take 5 mg by mouth 2 (two) times daily.    losartan-hydrochlorothiazide (HYZAAR) 50-12.5 MG tablet TAKE 2 TABLETS BY MOUTH DAILY   metoprolol tartrate (LOPRESSOR) 25 MG tablet Take 12.5 mg by mouth 2 (two) times daily as needed.    Multiple Vitamin (MULTIVITAMIN WITH MINERALS) TABS tablet Take 1 tablet by mouth daily.   rosuvastatin (CRESTOR) 5 MG tablet Take 5 mg by mouth at bedtime.      Allergies:   Patient has no known allergies.   Social History   Socioeconomic History   Marital status: Married    Spouse name: Not on file   Number of children: Not on file   Years of education: Not on file   Highest education level: Not on file  Occupational History   Not on file  Social Needs   Financial resource strain: Not on file   Food insecurity    Worry: Not on file    Inability: Not on file   Transportation needs    Medical: Not on file    Non-medical: Not on file  Tobacco Use   Smoking status: Never Smoker   Smokeless tobacco: Never Used  Substance and Sexual Activity   Alcohol use: No    Alcohol/week: 0.0 standard drinks   Drug use: No   Sexual activity: Yes  Lifestyle   Physical activity    Days per week: Not on file    Minutes per session: Not on file   Stress: Not on file  Relationships   Social connections    Talks on phone: Not on file    Gets together: Not on file    Attends religious service: Not on file    Active member of club or organization: Not on file    Attends meetings of clubs or organizations:  Not on file    Relationship status: Not on file  Other Topics Concern   Not on file  Social History Narrative   [place] Lives with husband in a 2 story home.  Has 5 children. (1 set of twins)   Retired Architectural technologistteacher's assistant.     Education: some college.      Family History:  The patient's family history includes Cancer in her father and mother; Thyroid cancer in her daughter.  ROS:   Review of Systems  Constitutional: Positive for malaise/fatigue. Negative for chills, diaphoresis, fever and weight loss.  HENT: Negative  for congestion.   Eyes: Negative for discharge and redness.  Respiratory: Negative for cough, hemoptysis, sputum production, shortness of breath and wheezing.   Cardiovascular: Positive for palpitations. Negative for chest pain, orthopnea, claudication, leg swelling and PND.  Gastrointestinal: Negative for abdominal pain, blood in stool, heartburn, melena, nausea and vomiting.  Genitourinary: Negative for hematuria.  Musculoskeletal: Negative for falls and myalgias.  Skin: Negative for rash.  Neurological: Negative for dizziness, tingling, tremors, sensory change, speech change, focal weakness, loss of consciousness and weakness.  Endo/Heme/Allergies: Does not bruise/bleed easily.  Psychiatric/Behavioral: Negative for substance abuse. The patient is not nervous/anxious.   All other systems reviewed and are negative.    EKGs/Labs/Other Studies Reviewed:    Studies reviewed were summarized above. The additional studies were reviewed today:  48-hour Holter 05/2017: Normal sinus rhythm Rare PACs and PVCs. 11 beat run of borderline wide QRS tachycardia likely SVT with aberrancy. __________  2D Echo 05/2017: - Left ventricle: The cavity size was normal. Wall thickness was   normal. Systolic function was normal. The estimated ejection   fraction was in the range of 60% to 65%. Wall motion was normal;   there were no regional wall motion abnormalities.  Doppler   parameters are consistent with abnormal left ventricular   relaxation (grade 1 diastolic dysfunction). - Pulmonary arteries: Systolic pressure was at the upper limits of   normal. PA peak pressure: 38 mm Hg (S).   EKG:  EKG is ordered today.  The EKG ordered today demonstrates NSR, 69 bpm, low voltage QRS, no acute ST-T changes  Recent Labs: 03/09/2019: TSH 1.78  Recent Lipid Panel    Component Value Date/Time   LDLDIRECT 74.0 06/07/2015 1057    PHYSICAL EXAM:    VS:  BP 132/82 (BP Location: Left Arm, Patient Position: Sitting, Cuff Size: Normal)    Pulse 69    Ht 5\' 3"  (1.6 m)    Wt 149 lb 8 oz (67.8 kg)    BMI 26.48 kg/m   BMI: Body mass index is 26.48 kg/m.  Physical Exam  Constitutional: She is oriented to person, place, and time. She appears well-developed and well-nourished.  HENT:  Head: Normocephalic and atraumatic.  Eyes: Right eye exhibits no discharge. Left eye exhibits no discharge.  Neck: Normal range of motion. No JVD present.  Cardiovascular: Normal rate, regular rhythm, S1 normal, S2 normal and normal heart sounds. Exam reveals no distant heart sounds, no friction rub, no midsystolic click and no opening snap.  No murmur heard. Pulses:      Posterior tibial pulses are 2+ on the right side and 2+ on the left side.  Pulmonary/Chest: Effort normal and breath sounds normal. No respiratory distress. She has no decreased breath sounds. She has no wheezes. She has no rales. She exhibits no tenderness.  Abdominal: Soft. She exhibits no distension. There is no abdominal tenderness.  Musculoskeletal:        General: No edema.  Neurological: She is alert and oriented to person, place, and time.  Skin: Skin is warm and dry. No cyanosis. Nails show no clubbing.  Psychiatric: She has a normal mood and affect. Her speech is normal and behavior is normal. Judgment and thought content normal.    Wt Readings from Last 3 Encounters:  04/12/19 149 lb 8 oz (67.8 kg)    05/17/18 156 lb 8 oz (71 kg)  01/11/18 154 lb (69.9 kg)     ASSESSMENT & PLAN:   1. New onset A. Fib/palpitations:  [facility]2019 with worsening palpitations and found to be in new onset A. fib with RVR with ventricular rates in the 160s to 170s bpm.  Heart rate responded well to low-dose Lopressor with subsequent conversion to sinus rhythm, possibly on 8/15 based on patient reported heart rates.  She was placed on Lopressor 12.5 mg twice daily as well as Eliquis 5 mg twice daily.  CHADS2VASc at least 3 (HTN, age x 1, sex category).  In the office today, the patient the patient is maintaining sinus rhythm.  She has continued to note intermittent palpitations, most recently around 2 PM on 04/11/2019.  In this setting, we will place a ZIO monitor to evaluate for breakthrough A. fib or other arrhythmia given her known history of paroxysmal SVT.  She will continue Lopressor 12 5 mg twice daily as well as Eliquis 5 mg twice daily.  No symptoms concerning for bleeding.  Recent CBC demonstrated stable hemoglobin.  When she is seen in follow-up in 1 month recommend rechecking BMP and CBC.  Recent TSH normal as outlined above.  Check echo to evaluate for any structural abnormalities.  Patient denies any symptoms concerning for sleep apnea.  [facility] will be scanned into Epic.  2. Paroxysmal SVT: As above.  3. Hypertension: Blood pressure is mildly elevated today.  Continue Lopressor 12.5 mg twice daily, Norvasc 5 mg daily, and Hyzaar 50/12.5 mg daily.  4. Hyperlipidemia: LDL 41 from 01/2019.  Remains on Crestor.  Follow-up PCP.  5. Hyperthyroidism: No longer on methimazole.  Recent thyroid function normal as outlined above.  Disposition: F/u with Dr. Kirke CorinArida or an APP in 1 month.   Medication Adjustments/Labs and Tests Ordered: Current medicines are reviewed at length with the patient today.  Concerns regarding medicines are outlined above. Medication changes, Labs and  Tests ordered today are summarized above and listed in the Patient Instructions accessible in Encounters.   Signed, Eula Listenyan Darvin Dials, PA-C 04/12/2019 9:35 AM     [facility] [address] [place], [place] [address] [phone]

## 2019-04-11 NOTE — Progress Notes (Unsigned)
Received labs from 04/06/2019: TSH 2.14, free T4 1.18, free T3 2.95, all normal

## 2019-04-12 ENCOUNTER — Encounter: Payer: Self-pay | Admitting: Physician Assistant

## 2019-04-12 ENCOUNTER — Ambulatory Visit (INDEPENDENT_AMBULATORY_CARE_PROVIDER_SITE_OTHER): Payer: Medicare Other

## 2019-04-12 ENCOUNTER — Ambulatory Visit (INDEPENDENT_AMBULATORY_CARE_PROVIDER_SITE_OTHER): Payer: Medicare Other | Admitting: Physician Assistant

## 2019-04-12 ENCOUNTER — Other Ambulatory Visit: Payer: Self-pay

## 2019-04-12 VITALS — BP 132/82 | HR 69 | Ht 63.0 in | Wt 149.5 lb

## 2019-04-12 DIAGNOSIS — I4891 Unspecified atrial fibrillation: Secondary | ICD-10-CM | POA: Diagnosis not present

## 2019-04-12 DIAGNOSIS — R002 Palpitations: Secondary | ICD-10-CM

## 2019-04-12 DIAGNOSIS — E782 Mixed hyperlipidemia: Secondary | ICD-10-CM

## 2019-04-12 DIAGNOSIS — I10 Essential (primary) hypertension: Secondary | ICD-10-CM | POA: Diagnosis not present

## 2019-04-12 DIAGNOSIS — I471 Supraventricular tachycardia: Secondary | ICD-10-CM

## 2019-04-12 DIAGNOSIS — E0590 Thyrotoxicosis, unspecified without thyrotoxic crisis or storm: Secondary | ICD-10-CM

## 2019-04-12 MED ORDER — METOPROLOL TARTRATE 25 MG PO TABS: 12.5000 mg | ORAL_TABLET | Freq: Two times a day (BID) | ORAL | 3 refills | Status: DC

## 2019-04-12 MED ORDER — ELIQUIS 5 MG PO TABS: 5.0000 mg | ORAL_TABLET | Freq: Two times a day (BID) | ORAL | 11 refills | Status: DC

## 2019-04-12 NOTE — Patient Instructions (Signed)
Medication Instructions:  Your physician recommends that you continue on your current medications as directed. Please refer to the Current Medication list given to you today.  If you need a refill on your cardiac medications before your next appointment, please call your pharmacy.   Lab work: None ordered  If you have labs (blood work) drawn today and your tests are completely normal, you will receive your results only by: Marland Kitchen MyChart Message (if you have MyChart) OR . A paper copy in the mail If you have any lab test that is abnormal or we need to change your treatment, we will call you to review the results.  Testing/Procedures: 1- A zio monitor was placed today. It will remain on for 14 days. You will then return monitor and event diary in provided box. It takes 1-2 weeks for report to be downloaded and returned to [place]. We will call you with the results. If monitor falls of or has orange flashing light, please call Zio for further instructions.   2- Echo  Please return to [facility] on ______________ at _______________ AM/PM for an Echocardiogram. Your physician has requested that you have an echocardiogram. Echocardiography is a painless test that uses sound waves to create images of your heart. It provides your doctor with information about the size and shape of your heart and how well your heart's chambers and valves are working. This procedure takes approximately one hour. There are no restrictions for this procedure. Please note; depending on visual quality an IV may need to be placed.    Follow-Up: At [facility], you and your health needs are our priority.  As part of our continuing mission to provide you with exceptional heart care, we have created designated Provider Care Teams.  These Care Teams include your primary Cardiologist (physician) and Advanced Practice Providers (APPs -  Physician Assistants and Nurse Practitioners) who all work together to provide you with  the care you need, when you need it. You will need a follow up appointment in 1 months. You may see Kathlyn Sacramento, MD or Christell Faith, PA-C.

## 2019-04-12 NOTE — Telephone Encounter (Signed)
Patient came to appointment today in the office. Closing this encounter.

## 2019-04-14 ENCOUNTER — Encounter

## 2019-04-14 ENCOUNTER — Ambulatory Visit: Payer: Medicare Other | Admitting: Cardiovascular Disease

## 2019-04-21 ENCOUNTER — Other Ambulatory Visit: Payer: Self-pay | Admitting: Physician Assistant

## 2019-04-21 DIAGNOSIS — I4891 Unspecified atrial fibrillation: Secondary | ICD-10-CM

## 2019-04-26 ENCOUNTER — Other Ambulatory Visit: Payer: Self-pay | Admitting: Cardiovascular Disease

## 2019-04-28 ENCOUNTER — Other Ambulatory Visit: Payer: Medicare Other

## 2019-05-03 ENCOUNTER — Other Ambulatory Visit: Payer: Self-pay

## 2019-05-03 MED ORDER — METOPROLOL TARTRATE 25 MG PO TABS: 12.5000 mg | ORAL_TABLET | Freq: Two times a day (BID) | ORAL | 3 refills | Status: DC

## 2019-05-03 NOTE — Telephone Encounter (Signed)
*  STAT* If patient is at the pharmacy, call can be transferred to refill team.   1. Which medications need to be refilled? (please list name of each medication and dose if known) Metoprolol  2. Which pharmacy/location (including street and city if local pharmacy) is medication to be sent to? [facility] [place]  3. Do they need a 30 day or 90 day supply? 90   

## 2019-05-05 ENCOUNTER — Ambulatory Visit: Payer: Medicare Other | Admitting: Cardiovascular Disease

## 2019-05-09 ENCOUNTER — Other Ambulatory Visit: Payer: Medicare Other

## 2019-05-11 NOTE — Progress Notes (Signed)
Cardiology Office Note    Date:  05/13/2019   ID:  Doris [name], DOB February 14, 1946, MRN [id number]  PCP:  [facility]  Cardiologist:  Lorine BearsMuhammad Arida, MD  Electrophysiologist:  None   Chief Complaint: Follow up  History of Present Illness:   Doris [name] is a 73 y.o. female with history of recently diagnosed A. fib on 04/08/2019 at [facility], paroxysmal SVT, hypertension, hyperlipidemia, and hyperthyroidism who presents for follow-up of A. fib.  Patient was seen in late 2018 for evaluation of palpitations with 48-hour monitor showing normal sinus rhythm with rare PACs and PVCs with an 11 beat run of SVT with aberrancy.  Follow-up echo in 05/2017 showed normal LV systolic function with grade 1 diastolic dysfunction.  At that time, we advised patient she could start beta-blocker if she wanted, however she declined.  Patient felt like the reason she had the palpitations was in the setting of her daughter's pregnancy/birth of grandchild.  She was seen in the office in 04/2018 and had done exceptionally well over the prior year.  She indicated at that time her blood pressure typically ran in the 120s and she attempted to get approximately 5000 steps in per day and was asymptomatic with this.  Patient notified our office in 03/2019 of increased stress with upper trend in BP with readings in the 130s to 140s with heart rates in the 50s to low 60s bpm.  In this setting, she also felt increased fatigue and requested a follow-up cardiology appointment.  Following this, the patient was [facility]2019 with worsening palpitations that began earlier that day with EKG showing A. fib with RVR, 169 bpm, normal axis, mild anterolateral ST depression noted on ED read (patient brought in a [facility] which does show A. fib with RVR, 169 bpm, normal axis, mild anterolateral ST-T changes).  Chest x-ray showed no acute abnormality.  Labs were unrevealing as outlined below,  with a troponin less than 0.03.  She was given Cardizem in the ED without much improvement.  Given this, she was given metoprolol with improvement in heart rate to the low 100s to 130s bpm and was asymptomatic.  She was started on Lopressor 12.5 mg twice daily along with Eliquis 5 mg twice daily.  She was seen in follow-up on 04/12/2019 and was doing well.  She did continue to note intermittent palpitations and reported compliance with Lopressor 12.5 mg twice daily along with Eliquis twice daily.  Patient's heart rate log showed improved heart rates into the 80s to 90s bpm beginning on 8/15.  She was noted to be in sinus rhythm at her visit.  Given ongoing tachypalpitations, we placed a Zio monitor to evaluate for breakthrough arrhythmia with preliminary reading showing NSR with an average heart rate of 56 bpm with a range of 40-222 bpm.  37 runs of SVT with the fastest interval lasting 10 beats in the longest interval lasting 19 beats.  A. fib was noted with an overall less than 1% burden with the longest episode lasting 1 minute and 39 seconds.    She comes in doing very well today.  Since she was last seen and since he has completed wearing her outpatient cardiac monitoring she has noted a significant improvement in her palpitations with them occurring only very randomly now and when they do they are split second lasting.  There are no associated symptoms with these.  She is  very pleased with her improvement.  She denies any chest pain, shortness of breath, dizziness, presyncope, syncope, lower extremity swelling, abdominal tension, orthopnea, PND, early satiety, falls, BRBPR, or melena.  Tolerating her metoprolol and Eliquis without issues.  She does not have any issues or concerns at this time.   Labs: 03/2019 - WBC 11.2, Hgb 13.7, PLT 245, potassium 3.6, serum creatinine 1.1, AST/ALT normal, albumin 4.9, TSH normal, free T4 normal, free T3 normal 01/2019 - total cholesterol 122, triglyceride 155, HDL  50, LDL 41  Past Medical History:  Diagnosis Date   Graves disease    Hyperlipidemia    Hypertension    Hyperthyroidism    Palpitations    a. 05/2017 Echo: EF 60-65%, no rwma, Gr1 DD. PASP 38mmHg; b. 05/2017 48h Holter: Rare PACs and PVCs. 11 beat run of SVT w/ aberrancy.   Positive TB test     Past Surgical History:  Procedure Laterality Date   CESAREAN SECTION     x 2   EXCISION METACARPAL MASS Right 12/17/2017   Procedure: EXCISION METACARPAL MASS;  Surgeon: Kennedy BuckerMenz, Michael, MD;  Location: [facility] ORS;  Service: Orthopedics;  Laterality: Right;    Current Medications: No outpatient medications have been marked as taking for the 05/13/19 encounter (Office Visit) with Sondra Bargesunn, Mickael Mcnutt M, PA-C.    Allergies:   Patient has no known allergies.   Social History   Socioeconomic History   Marital status: Married    Spouse name: Not on file   Number of children: Not on file   Years of education: Not on file   Highest education level: Not on file  Occupational History   Not on file  Social Needs   Financial resource strain: Not on file   Food insecurity    Worry: Not on file    Inability: Not on file   Transportation needs    Medical: Not on file    Non-medical: Not on file  Tobacco Use   Smoking status: Never Smoker   Smokeless tobacco: Never Used  Substance and Sexual Activity   Alcohol use: No    Alcohol/week: 0.0 standard drinks   Drug use: No   Sexual activity: Yes  Lifestyle   Physical activity    Days per week: Not on file    Minutes per session: Not on file   Stress: Not on file  Relationships   Social connections    Talks on phone: Not on file    Gets together: Not on file    Attends religious service: Not on file    Active member of club or organization: Not on file    Attends meetings of clubs or organizations: Not on file    Relationship status: Not on file  Other Topics Concern   Not on file  Social History Narrative   [place] with husband in a 2 story home.  Has 5 children. (1 set of twins)   Retired Architectural technologistteacher's assistant.     Education: some college.      Family History:  The patient's family history includes Cancer in her father and mother; Thyroid cancer in her daughter.  ROS:   Review of Systems  Constitutional: Negative for chills, diaphoresis, fever, malaise/fatigue and weight loss.  HENT: Negative for congestion.   Eyes: Negative for discharge and redness.  Respiratory: Negative for cough, hemoptysis, sputum production, shortness of breath and wheezing.   Cardiovascular: Positive for palpitations. Negative for chest pain, orthopnea, claudication,  leg swelling and PND.       Much improved palpitations  Gastrointestinal: Negative for abdominal pain, blood in stool, heartburn, melena, nausea and vomiting.  Genitourinary: Negative for hematuria.  Musculoskeletal: Negative for falls and myalgias.  Skin: Negative for rash.  Neurological: Negative for dizziness, tingling, tremors, sensory change, speech change, focal weakness, loss of consciousness and weakness.  Endo/Heme/Allergies: Does not bruise/bleed easily.  Psychiatric/Behavioral: Negative for substance abuse. The patient is not nervous/anxious.   All other systems reviewed and are negative.    EKGs/Labs/Other Studies Reviewed:    Studies reviewed were summarized above. The additional studies were reviewed today:  48-hour Holter 05/2017: Normal sinus rhythm Rare PACs and PVCs. 11 beat run of borderline wide QRS tachycardia likely SVT with aberrancy. __________  2D Echo 05/2017: - Left ventricle: The cavity size was normal. Wall thickness was normal. Systolic function was normal. The estimated ejection fraction was in the range of 60% to 65%. Wall motion was normal; there were no regional wall motion abnormalities. Doppler parameters are consistent with abnormal left ventricular relaxation (grade 1 diastolic  dysfunction). - Pulmonary arteries: Systolic pressure was at the upper limits of normal. PA peak pressure: 38 mm Hg (S).   EKG:  EKG is ordered today.  The EKG ordered today demonstrates sinus bradycardia, 58 bpm, low voltage QRS, no acute ST-T changes  Recent Labs: 03/09/2019: TSH 1.78  Recent Lipid Panel    Component Value Date/Time   LDLDIRECT 74.0 06/07/2015 1057    PHYSICAL EXAM:    VS:  BP 130/80 (BP Location: Left Arm, Patient Position: Sitting, Cuff Size: Normal)    Pulse (!) 58    Ht 5\' 3"  (1.6 m)    Wt 149 lb (67.6 kg)    SpO2 99%    BMI 26.39 kg/m   BMI: Body mass index is 26.39 kg/m.  Physical Exam  Constitutional: She is oriented to person, place, and time. She appears well-developed and well-nourished.  HENT:  Head: Normocephalic and atraumatic.  Eyes: Right eye exhibits no discharge. Left eye exhibits no discharge.  Neck: Normal range of motion. No JVD present.  Cardiovascular: Normal rate, regular rhythm, S1 normal, S2 normal and normal heart sounds. Exam reveals no distant heart sounds, no friction rub, no midsystolic click and no opening snap.  No murmur heard. Pulses:      Dorsalis pedis pulses are 2+ on the right side and 2+ on the left side.       Posterior tibial pulses are 2+ on the right side and 2+ on the left side.  Pulmonary/Chest: Effort normal and breath sounds normal. No respiratory distress. She has no decreased breath sounds. She has no wheezes. She has no rales. She exhibits no tenderness.  Abdominal: Soft. She exhibits no distension. There is no abdominal tenderness.  Musculoskeletal:        General: No edema.  Neurological: She is alert and oriented to person, place, and time.  Skin: Skin is warm and dry. No cyanosis. Nails show no clubbing.  Psychiatric: She has a normal mood and affect. Her speech is normal and behavior is normal. Judgment and thought content normal.    Wt Readings from Last 3 Encounters:  05/13/19 149 lb (67.6 kg)    04/12/19 149 lb 8 oz (67.8 kg)  05/17/18 156 lb 8 oz (71 kg)     ASSESSMENT & PLAN:   1. PAF/paroxysmal SVT/palpitations: Much improved, even since she has completed her ZIO.  Continue current dose metoprolol  12.5 mg twice daily.  Given bradycardic heart rates there is no room for escalation at this time.  Should she note fatigue with beta-blockade we could transition her to non-dihydropyridine calcium channel blocker in place of her amlodipine.  She remains on Eliquis 5 mg twice daily given a CHADS2VASc of at least 3.  No symptoms concerning for bleeding.  We will obtain CBC and BMP today given she is on anticoagulation.  Echo is scheduled for later today.  2. HTN: Blood pressure remains reasonably controlled.  Continue current dose of amlodipine and metoprolol.  3. HLD: LDL of 41 from 01/2019.  Remains on Crestor.  Followed by PCP.  4. Hyperthyroidism: No longer on methimazole.  Recent thyroid function normal as outlined above.  Disposition: F/u with Dr. Kirke Corin or an APP in 6 months.   Medication Adjustments/Labs and Tests Ordered: Current medicines are reviewed at length with the patient today.  Concerns regarding medicines are outlined above. Medication changes, Labs and Tests ordered today are summarized above and listed in the Patient Instructions accessible in Encounters.   Signed, Eula Listen, PA-C 05/13/2019 1:50 PM     [facility] [place] [address] [place], [place] [address] [phone]

## 2019-05-13 ENCOUNTER — Encounter: Payer: Self-pay | Admitting: Physician Assistant

## 2019-05-13 ENCOUNTER — Ambulatory Visit (INDEPENDENT_AMBULATORY_CARE_PROVIDER_SITE_OTHER): Payer: Medicare Other

## 2019-05-13 ENCOUNTER — Other Ambulatory Visit: Payer: Self-pay

## 2019-05-13 ENCOUNTER — Ambulatory Visit (INDEPENDENT_AMBULATORY_CARE_PROVIDER_SITE_OTHER): Payer: Medicare Other | Admitting: Physician Assistant

## 2019-05-13 ENCOUNTER — Encounter

## 2019-05-13 VITALS — BP 130/80 | HR 58 | Ht 63.0 in | Wt 149.0 lb

## 2019-05-13 DIAGNOSIS — I471 Supraventricular tachycardia, unspecified: Secondary | ICD-10-CM

## 2019-05-13 DIAGNOSIS — I480 Paroxysmal atrial fibrillation: Secondary | ICD-10-CM | POA: Diagnosis not present

## 2019-05-13 DIAGNOSIS — E782 Mixed hyperlipidemia: Secondary | ICD-10-CM | POA: Diagnosis not present

## 2019-05-13 DIAGNOSIS — I10 Essential (primary) hypertension: Secondary | ICD-10-CM

## 2019-05-13 DIAGNOSIS — I4891 Unspecified atrial fibrillation: Secondary | ICD-10-CM | POA: Diagnosis not present

## 2019-05-13 LAB — ECHOCARDIOGRAM COMPLETE
Height: 63 in
Weight: 2384 oz

## 2019-05-13 NOTE — Patient Instructions (Signed)
Medication Instructions:   Your physician recommends that you continue on your current medications as directed. Please refer to the Current Medication list given to you today.  If you need a refill on your cardiac medications before your next appointment, please call your pharmacy.   Lab work:  Your physician recommends that you have lab work today: BMET and CBC  If you have labs (blood work) drawn today and your tests are completely normal, you will receive your results only by: Marland Kitchen MyChart Message (if you have MyChart) OR . A paper copy in the mail If you have any lab test that is abnormal or we need to change your treatment, we will call you to review the results.  Testing/Procedures:  NONE  Follow-Up: At [facility], you and your health needs are our priority.  As part of our continuing mission to provide you with exceptional heart care, we have created designated Provider Care Teams.  These Care Teams include your primary Cardiologist (physician) and Advanced Practice Providers (APPs -  Physician Assistants and Nurse Practitioners) who all work together to provide you with the care you need, when you need it. You will need a follow up appointment in 6 months.  Please call our office 2 months in advance to schedule this appointment.  You may see Kathlyn Sacramento, MD or Christell Faith, PA-C

## 2019-05-14 LAB — BASIC METABOLIC PANEL
BUN/Creatinine Ratio: 25 (ref 12–28)
BUN: 27 mg/dL (ref 8–27)
CO2: 23 mmol/L (ref 20–29)
Calcium: 9.6 mg/dL (ref 8.7–10.3)
Chloride: 95 mmol/L — ABNORMAL LOW (ref 96–106)
Creatinine, Ser: 1.06 mg/dL — ABNORMAL HIGH (ref 0.57–1.00)
GFR calc Af Amer: 60 mL/min/{1.73_m2} (ref >59)
GFR calc non Af Amer: 52 mL/min/{1.73_m2} — ABNORMAL LOW (ref >59)
Glucose: 92 mg/dL (ref 65–99)
Potassium: 4.4 mmol/L (ref 3.5–5.2)
Sodium: 135 mmol/L (ref 134–144)

## 2019-05-14 LAB — CBC
Hematocrit: 38.7 % (ref 34.0–46.6)
Hemoglobin: 13.4 g/dL (ref 11.1–15.9)
MCH: 30.6 pg (ref 26.6–33.0)
MCHC: 34.6 g/dL (ref 31.5–35.7)
MCV: 88 fL (ref 79–97)
Platelets: 232 10*3/uL (ref 150–450)
RBC: 4.38 x10E6/uL (ref 3.77–5.28)
RDW: 12.4 % (ref 11.7–15.4)
WBC: 6.7 10*3/uL (ref 3.4–10.8)

## 2019-05-16 ENCOUNTER — Telehealth: Payer: Self-pay | Admitting: Cardiovascular Disease

## 2019-05-16 ENCOUNTER — Other Ambulatory Visit: Payer: Self-pay

## 2019-05-16 DIAGNOSIS — I480 Paroxysmal atrial fibrillation: Secondary | ICD-10-CM

## 2019-05-16 DIAGNOSIS — I272 Pulmonary hypertension, unspecified: Secondary | ICD-10-CM

## 2019-05-16 NOTE — Telephone Encounter (Signed)
Patient calling in curious to find out results of Echo from 9/18, please advise when able

## 2019-05-16 NOTE — Telephone Encounter (Addendum)
Patient made aware of echo and lab results with verbalized understanding. Ref for Pulmonology. Adv the patient that their office will contact her to schedule.   Dunn, Ryan M, PA-C  P Cv Div [facility] Triage        Echo showed normal pump function, slightly stiffened heart, mild pulmonary hypertension and mildly dilated ascending aorta.  Follow up echo in 12 months.   Optimal BP and heart rate control are recommended, both were controlled at her last visit.  Given recently diagnosed Afib and pulmonary hypertension, I would like to have her see pulmonology for possible sleep study.    Notes recorded by Rise Mu, PA-C on 05/15/2019 at 10:23 AM EDT  Renal function is relatively stable.  Potassium is improved and at goal.  Please increase water intake slightly.

## 2019-05-20 ENCOUNTER — Telehealth: Payer: Self-pay | Admitting: Cardiovascular Disease

## 2019-05-20 NOTE — Telephone Encounter (Signed)
Patient has some questions regarding her palpitations-afib. States she needs some further explanations on her condition.

## 2019-05-20 NOTE — Telephone Encounter (Signed)
Pt calling with questions regarding her PAF. Pt states she experienced a transient episode today, but took her medication and it resolved. She is compliant with all her medications including her anticoagulant. I explained to her this is normal with PAF, but if she notices long periods (hours or days) especially if she is symptomatic, she should call the office to notify her provider. She has verbalized understanding and had no additional questions.

## 2019-05-23 ENCOUNTER — Telehealth: Payer: Self-pay | Admitting: Cardiovascular Disease

## 2019-05-23 NOTE — Telephone Encounter (Signed)
Pt c/o medication issue:  1. Name of Medication: PAF medication   2. How are you currently taking this medication (dosage and times per day)? Not sure   3. Are you having a reaction (difficulty breathing--STAT)? no  4. What is your medication issue? Patient not sure about new PAF medication and also what numbers to watch out for with afib

## 2019-05-23 NOTE — Telephone Encounter (Signed)
Spoke with the patient. Patient sts that she has been doing well. She is wondering of there are any parameters with her BP and HR that she should be aware of. She was dx with Afib in Aug 2020 and she is trying to learn how to manage her dx.  She is tolerating Metoprolol. She is taking Eliquis as prescribed and has no signs of bleeding. She is currently asymptomatic. Adv the patent to contact the office if her BP is consistently low or high <90/50 >140/90, HR <50bpm >110 bpm. Patient verbalized understanding and voiced appreciation for the call back.

## 2019-05-31 ENCOUNTER — Telehealth: Payer: Self-pay | Admitting: Cardiovascular Disease

## 2019-05-31 NOTE — Telephone Encounter (Signed)
Spoke with the patient. Patient sts that yesterday she had one BP reading were her BP was elevated and her systolic was in the high 628'M/381/R. Patient sts that she did have a headache. Her headache was relieved with Tylenol. Later in the day her systolic BP went down to the 120's. Patient is currently asymptomatic.  Patient sts that her HR was in the high 50's, low 60's. Patient denies palpitations, chest pain, sob. dizziness, light headiness.  Patient sts that her BP this morning was fine. Patient does sts that she has been a lot more anxious since her AFIB dx. Adv the patient that her elevated reading could have been caused by the headache.  Adv the patient to continue to monitor her BP daily and to contact the office if her BP is consistently elevated. Adv the patient to also contact the office if cardiac symptoms develop.  Patient is wanting to know if there are any restrictions in her activity. She typically tries to do 5,000-6,000 steps daily. Adv the patient that walking/exercise is encouraged and there are typically no restrictions in activity for Afib dx. Adv the patient that I will fwd an FYI to Dr. Fletcher Anon and call back if he has additional recommendations. Patient voiced appreciation for the call back.

## 2019-05-31 NOTE — Telephone Encounter (Signed)
Pt c/o BP issue: STAT if pt c/o blurred vision, one-sided weakness or slurred speech  1. What are your last 5 BP readings? 122'E-497'N systolic  2. Are you having any other symptoms (ex. Dizziness, headache, blurred vision, passed out)? Headache   3. What is your BP issue? Not feeling well uncomfortable with higher numbers and new afib

## 2019-06-21 ENCOUNTER — Ambulatory Visit: Payer: Medicare Other | Admitting: Pulmonary Disease

## 2019-06-21 ENCOUNTER — Other Ambulatory Visit: Payer: Self-pay

## 2019-06-21 ENCOUNTER — Encounter: Payer: Self-pay | Admitting: Pulmonary Disease

## 2019-06-21 VITALS — BP 120/80 | HR 68 | Temp 97.5°F | Ht 63.0 in | Wt 149.0 lb

## 2019-06-21 DIAGNOSIS — R0609 Other forms of dyspnea: Secondary | ICD-10-CM

## 2019-06-21 NOTE — Patient Instructions (Signed)
We will obtain an overnight oxygent test.   We will see you back in 3-4 weeks.

## 2019-07-14 ENCOUNTER — Ambulatory Visit: Payer: Medicare Other | Admitting: Pulmonary Disease

## 2019-08-03 ENCOUNTER — Telehealth: Payer: Self-pay | Admitting: Pulmonary Disease

## 2019-08-03 NOTE — Telephone Encounter (Signed)
ONO order was unable to be sent to DME, as office note has been completed. Pt will need a new face to face at this time, as it has been more then 30 days since last OV.  Pt has an upcoming appt on 09/05/2019, at this visit ONO will need to be reordered.  Pt is aware and agreeable to readdress this matter at next office visit.

## 2019-08-09 ENCOUNTER — Ambulatory Visit: Payer: Medicare Other | Admitting: Pulmonary Disease

## 2019-08-12 DIAGNOSIS — I4891 Unspecified atrial fibrillation: Secondary | ICD-10-CM | POA: Insufficient documentation

## 2019-09-05 ENCOUNTER — Ambulatory Visit: Payer: Medicare Other | Admitting: Pulmonary Disease

## 2019-09-06 ENCOUNTER — Ambulatory Visit (INDEPENDENT_AMBULATORY_CARE_PROVIDER_SITE_OTHER): Payer: Medicare PPO | Admitting: Pulmonary Disease

## 2019-09-06 DIAGNOSIS — I272 Pulmonary hypertension, unspecified: Secondary | ICD-10-CM

## 2019-09-06 NOTE — Progress Notes (Signed)
   Subjective:    Patient ID: Doris [name], female    DOB: 1945-12-08, 74 y.o.   MRN: [id number] Virtual Visit Via Video or Telephone Note:   This visit type was conducted due to national recommendations for restrictions regarding the COVID-19 pandemic .  This format is felt to be most appropriate for this patient at this time.  All issues noted in this document were discussed and addressed.  No physical exam was performed (except for noted visual exam findings with Video Visits).    I connected with [name] by a video enabled telemedicine application or telephone at 4 PM and verified that I was speaking with the correct person using two identifiers. Location patient: home Location provider: [facility] Persons participating in the virtual visit: patient, physician   I discussed the limitations, risks, security and privacy concerns of performing an evaluation and management service by video and the availability of in person appointments. The patient expressed understanding and agreed to proceed.  HPI Patient was seen on 27 October for pulmonary hypertension.  Overnight oximetry was ordered however the test had issues with downloading and has to be repeated.  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  She would like to move the appointment to the end of the month.  She was so sure there would be no charge for today and we will reconvene at the end of the month.   Review of Systems A 10 point review of systems was performed and it is as noted above otherwise negative.    Objective:   Physical Exam  No physical exam performed as this was a telephone visit.      Assessment & Plan:  Pulmonary hypertension likely due to diastolic dysfunction however needs further evaluation Patient will need overnight oximetry performed  her schedule allows. Reschedule for the end of the month No charge for today   C. Danice Goltz, MD  PCCM   This note was dictated using voice recognition software/Dragon.  Despite best efforts to proofread, errors can occur which can change the meaning.  Any change was purely unintentional.

## 2019-10-05 ENCOUNTER — Ambulatory Visit (INDEPENDENT_AMBULATORY_CARE_PROVIDER_SITE_OTHER): Payer: Medicare PPO | Admitting: Pulmonary Disease

## 2019-10-05 ENCOUNTER — Encounter: Payer: Self-pay | Admitting: Pulmonary Disease

## 2019-10-05 DIAGNOSIS — I272 Pulmonary hypertension, unspecified: Secondary | ICD-10-CM

## 2019-10-05 NOTE — Progress Notes (Signed)
   Subjective:    Patient ID: Doris [name], female    DOB: [birth year], 74 y.o.   MRN: [id number] Virtual Visit Via Video or Telephone Note:   This visit type was conducted due to national recommendations for restrictions regarding the COVID-19 pandemic .  This format is felt to be most appropriate for this patient at this time.  All issues noted in this document were discussed and addressed.  No physical exam was performed (except for noted visual exam findings with Video Visits).    I connected with [name] by telephone at 7:30 AM and verified that I was speaking with the correct person using two identifiers. Location patient: home Location provider: [facility] Persons participating in the virtual visit: patient, physician   I discussed the limitations, risks, security and privacy concerns of performing an evaluation and management service by video and the availability of in person appointments. The patient expressed understanding and agreed to proceed.  HPI Patient was seen on 27 October for pulmonary hypertension.  Overnight oximetry was ordered however the test had issues with downloading and has to be repeated.  Because of Medicare guidelines this will have to be repeated.  We discussed this with the patient on 12 January via telephone conversation.  At that time however the patient was having some scheduling issues because of other members of the family needing assistance she is providing.  She would like to reschedule the oximetry now.  The test is required to the fact the patient does have pulmonary hypertension noted on echocardiogram.  She has had no new symptoms since we last convened on 12 January.  Again this visit will be no charge.    Review of Systems A 10 point review of systems was performed and it is as noted above otherwise negative.    Objective:   Physical Exam  No physical exam performed as this was a telephone visit.      Assessment &  Plan:  Pulmonary hypertension likely due to diastolic dysfunction however needs further evaluation Patient will need overnight oximetry performed her schedule allows Reschedule for the end of the month No charge for today   C. Danice Goltz, MD  PCCM   This note was dictated using voice recognition software/Dragon.  Despite best efforts to proofread, errors can occur which can change the meaning.  Any change was purely unintentional.

## 2019-10-05 NOTE — Patient Instructions (Signed)
We will reschedule your overnight oximetry.  Follow-up week after the study is done, this can be via telephone.

## 2019-10-05 NOTE — Patient Instructions (Signed)
There was no charge for today

## 2019-10-21 ENCOUNTER — Telehealth: Payer: Self-pay

## 2019-10-21 DIAGNOSIS — R0609 Other forms of dyspnea: Secondary | ICD-10-CM

## 2019-10-21 DIAGNOSIS — I272 Pulmonary hypertension, unspecified: Secondary | ICD-10-CM

## 2019-10-21 NOTE — Telephone Encounter (Signed)
Called and made pt aware of results and let her know that we would put the order in for her to get started. She verbalized her understanding and nothing further needed.

## 2019-10-24 NOTE — Addendum Note (Signed)
Addended by: Lajoyce Lauber A on: 10/24/2019 08:52 AM   Modules accepted: Orders

## 2019-11-10 ENCOUNTER — Ambulatory Visit (INDEPENDENT_AMBULATORY_CARE_PROVIDER_SITE_OTHER): Payer: Medicare PPO | Admitting: Cardiovascular Disease

## 2019-11-10 ENCOUNTER — Other Ambulatory Visit: Payer: Self-pay

## 2019-11-10 ENCOUNTER — Encounter: Payer: Self-pay | Admitting: Cardiovascular Disease

## 2019-11-10 VITALS — BP 120/70 | HR 63 | Ht 63.0 in | Wt 154.4 lb

## 2019-11-10 DIAGNOSIS — I480 Paroxysmal atrial fibrillation: Secondary | ICD-10-CM

## 2019-11-10 NOTE — Patient Instructions (Signed)

## 2019-11-10 NOTE — Progress Notes (Signed)
Cardiology Office Note   Date:  11/10/2019   ID:  Aella, Ronda 1946/01/30, MRN [id number]  PCP:  Tacey Ruiz, FNP  Cardiologist:   Lorine Bears, MD   Chief Complaint  Patient presents with  . OTHER    6 month f/u no complaints today. Meds reviewed verbally with pt.      History of Present Illness: Doris [name] is a 74 y.o. female who presents for  a followup visit regarding paroxysmal atrial fibrillation and hypertension.  She has known history of essential hypertension, hyperlipidemia and hypothyroidism. She was diagnosed with atrial fibrillation in August 2020 after she presented to [facility] ED with palpitations.  Her heart rate was 169 bpm.  She was discharged on small dose metoprolol and Eliquis and has done well since then.  She has intermittent palpitations but none lasting more than 10 or 15 seconds.  No chest pain or shortness of breath.  No side effects with anticoagulation. She had an echocardiogram done in September which showed an EF of 60 to 65% with mild pulmonary hypertension.   Past Medical History:  Diagnosis Date  . Graves disease   . Hyperlipidemia   . Hypertension   . Hyperthyroidism   . Palpitations    a. 05/2017 Echo: EF 60-65%, no rwma, Gr1 DD. PASP ; b. 05/2017 48h Holter: Rare PACs and PVCs. 11 beat run of SVT w/ aberrancy.  Marland Kitchen Positive TB test     Past Surgical History:  Procedure Laterality Date  . CESAREAN SECTION     x 2  . EXCISION METACARPAL MASS Right 12/17/2017   Procedure: EXCISION METACARPAL MASS;  Surgeon: Kennedy Bucker, MD;  Location: [facility] ORS;  Service: Orthopedics;  Laterality: Right;     Current Outpatient Medications  Medication Sig Dispense Refill  . acetaminophen (TYLENOL) 325 MG tablet Take 325 mg by mouth every 6 (six) hours as needed for headache.     Marland Kitchen amLODipine (NORVASC) 5 MG tablet TAKE 1 TABLET(5 MG) BY MOUTH DAILY 30 tablet 11  . Carboxymethylcellul-Glycerin (LUBRICATING EYE DROPS OP) Place 2 drops  into both eyes daily as needed (dry eyes).    Marland Kitchen ELIQUIS 5 MG TABS tablet Take 1 tablet (5 mg total) by mouth 2 (two) times daily. 60 tablet 11  . losartan-hydrochlorothiazide (HYZAAR) 50-12.5 MG tablet TAKE 2 TABLETS BY MOUTH DAILY 180 tablet 3  . metoprolol tartrate (LOPRESSOR) 25 MG tablet Take 0.5 tablets (12.5 mg total) by mouth 2 (two) times daily. 90 tablet 3  . Multiple Vitamin (MULTIVITAMIN WITH MINERALS) TABS tablet Take 1 tablet by mouth daily.    . rosuvastatin (CRESTOR) 5 MG tablet Take 5 mg by mouth at bedtime.      No current facility-administered medications for this visit.    Allergies:   Patient has no known allergies.    Social History:  The patient  reports that she has never smoked. She has never used smokeless tobacco. She reports that she does not drink alcohol or use drugs.   Family History:  The patient's family history includes Cancer in her father and mother; Thyroid cancer in her daughter.    ROS:  Please see the history of present illness.   Otherwise, review of systems are positive for none.   All other systems are reviewed and negative.    PHYSICAL EXAM: VS:  BP 120/70 (BP Location: Left Arm, Patient Position: Sitting, Cuff Size: Normal)   Pulse 63   Ht 5\' 3"  (1.6 m)  Wt 154 lb 6 oz (70 kg)   SpO2 98%   BMI 27.35 kg/m  , BMI Body mass index is 27.35 kg/m. GEN: Well nourished, well developed, in no acute distress  HEENT: normal  Neck: no JVD, carotid bruits, or masses Cardiac: RRR; no  rubs, or gallops,no edema . 1/ 6 systolic ejection murmur in the pulmonic area which is early peaking. Respiratory:  clear to auscultation bilaterally, normal work of breathing GI: soft, nontender, nondistended, + BS MS: no deformity or atrophy  Skin: warm and dry, no rash Neuro:  Strength and sensation are intact Psych: euthymic mood, full affect   EKG:  EKG is  ordered today. EKG showed normal sinus rhythm with no significant ST or T wave changes.   Recent  Labs: 03/09/2019: TSH 1.78 05/13/2019: BUN 27; Creatinine, Ser 1.06; Hemoglobin 13.4; Platelets 232; Potassium 4.4; Sodium 135    Lipid Panel    Component Value Date/Time   LDLDIRECT 74.0 06/07/2015 1057      Wt Readings from Last 3 Encounters:  11/10/19 154 lb 6 oz (70 kg)  06/21/19 149 lb (67.6 kg)  05/13/19 149 lb (67.6 kg)      ASSESSMENT AND PLAN:  1.  Paroxysmal atrial fibrillation: Doing well overall on metoprolol 12.5 mg twice daily.  She is tolerating anticoagulation with no side effects.  If atrial fibrillation becomes more frequent, we might need to consider treatment with an antiarrhythmic medication such as 1C drugs given inability to uptitrate metoprolol as resting heart rate is low.  2.  Essential hypertension: Blood pressure is controlled.  3.  Hyperlipidemia: On rosuvastatin.    Disposition:   FU with me in 6 months  Signed,  Kathlyn Sacramento, MD  11/10/2019 2:18 PM    [facility]

## 2019-11-23 ENCOUNTER — Telehealth: Payer: Self-pay | Admitting: Internal Medicine

## 2019-11-23 NOTE — Telephone Encounter (Signed)
error 

## 2019-11-24 ENCOUNTER — Other Ambulatory Visit: Payer: Self-pay

## 2019-11-24 ENCOUNTER — Other Ambulatory Visit (INDEPENDENT_AMBULATORY_CARE_PROVIDER_SITE_OTHER): Payer: Medicare PPO

## 2019-11-24 DIAGNOSIS — E0500 Thyrotoxicosis with diffuse goiter without thyrotoxic crisis or storm: Secondary | ICD-10-CM

## 2019-11-24 LAB — T4, FREE: Free T4: 0.94 ng/dL (ref 0.60–1.60)

## 2019-11-24 LAB — T3, FREE: T3, Free: 3.2 pg/mL (ref 2.3–4.2)

## 2019-11-24 LAB — TSH: TSH: 0.71 u[IU]/mL (ref 0.35–4.50)

## 2019-12-02 ENCOUNTER — Telehealth: Payer: Self-pay

## 2019-12-02 NOTE — Telephone Encounter (Signed)
Patient called today and I gave her the latest lab results per Dr. Althea Charon advice-patient verbalized an understanding

## 2019-12-30 ENCOUNTER — Telehealth: Payer: Self-pay | Admitting: Cardiovascular Disease

## 2019-12-30 NOTE — Telephone Encounter (Signed)
Please call patient to discuss afib. Patient states she was diagnosed in August and has some questions.

## 2019-12-30 NOTE — Telephone Encounter (Signed)
Spoke with the patient. Patient sts that yesterday evening she has a short run of what seemed like a irregular heart beat. Patient denies palpitations, racing heart rate, dizziness, light headiness, sob or any other symptoms. Patient sts that she was very bust yesterday and had a long day..  Patient is taking her Eliquis and Metoprolol as prescribed. Patient is asymptomatic. Adv her that break through afib can be expected with PAF. Advised the patient to continue to monitor and to contact the office if the episodes increase in frequency and duration and are associated with any symptoms.  Patient questions answered. Patient verbalized understanding and voiced appreciation for the call back.

## 2020-01-24 ENCOUNTER — Encounter: Payer: Self-pay | Admitting: *Deleted

## 2020-01-24 ENCOUNTER — Emergency Department: Payer: Medicare PPO

## 2020-01-24 ENCOUNTER — Other Ambulatory Visit: Payer: Self-pay

## 2020-01-24 ENCOUNTER — Emergency Department
Admission: EM | Admit: 2020-01-24 | Discharge: 2020-01-25 | Disposition: A | Discharge status: Home / Self Care | Payer: Medicare PPO | Attending: Emergency Medicine | Admitting: Emergency Medicine

## 2020-01-24 DIAGNOSIS — Z5321 Procedure and treatment not carried out due to patient leaving prior to being seen by health care provider: Secondary | ICD-10-CM | POA: Diagnosis not present

## 2020-01-24 DIAGNOSIS — R002 Palpitations: Secondary | ICD-10-CM | POA: Diagnosis present

## 2020-01-24 LAB — BASIC METABOLIC PANEL
Anion gap: 11 (ref 5–15)
BUN: 27 mg/dL — ABNORMAL HIGH (ref 8–23)
CO2: 25 mmol/L (ref 22–32)
Calcium: 9.3 mg/dL (ref 8.9–10.3)
Chloride: 96 mmol/L — ABNORMAL LOW (ref 98–111)
Creatinine, Ser: 1.11 mg/dL — ABNORMAL HIGH (ref 0.44–1.00)
GFR calc Af Amer: 57 mL/min — ABNORMAL LOW (ref >60)
GFR calc non Af Amer: 49 mL/min — ABNORMAL LOW (ref >60)
Glucose, Bld: 123 mg/dL — ABNORMAL HIGH (ref 70–99)
Potassium: 3.3 mmol/L — ABNORMAL LOW (ref 3.5–5.1)
Sodium: 132 mmol/L — ABNORMAL LOW (ref 135–145)

## 2020-01-24 LAB — CBC
HCT: 37.5 % (ref 36.0–46.0)
Hemoglobin: 13 g/dL (ref 12.0–15.0)
MCH: 29.8 pg (ref 26.0–34.0)
MCHC: 34.7 g/dL (ref 30.0–36.0)
MCV: 86 fL (ref 80.0–100.0)
Platelets: 251 10*3/uL (ref 150–400)
RBC: 4.36 MIL/uL (ref 3.87–5.11)
RDW: 12.8 % (ref 11.5–15.5)
WBC: 9.9 10*3/uL (ref 4.0–10.5)
nRBC: 0 % (ref 0.0–0.2)

## 2020-01-24 LAB — TROPONIN I (HIGH SENSITIVITY): Troponin I (High Sensitivity): 8 ng/L (ref <18)

## 2020-01-24 NOTE — ED Triage Notes (Signed)
Pt reports feeling palpitations earlier this evening. Pt has hx of Afib but reports the medications she has been taking have helped to keep her in a normal rhythm. Pt called cardiologist and was told to come to ED for an EKG. No other symptoms at this time. No lightheadedness or dizziness, pt reports she still feels a more mild version of the palpitations that brought her to the ED.

## 2020-01-25 ENCOUNTER — Telehealth: Payer: Self-pay | Admitting: Cardiovascular Disease

## 2020-01-25 LAB — TROPONIN I (HIGH SENSITIVITY): Troponin I (High Sensitivity): 9 ng/L (ref <18)

## 2020-01-25 NOTE — Telephone Encounter (Signed)
Spoke with the patient. She is currently asymptomatic. Patient sts that she developed palpitations last night around 8:30 pm that prompted her to go to the ED for evaluation around 10:30pm. Patient denied pre-syncope or syncope, chest pain, sob. Patient sts that she waited at the [facility] ED >10 hours and was not seen. She is currently driving home. Adv the patient that I have reviewed her EKG and is showed NSR. Her VS were not available for review. She reports taking her Eliquis and Metoprolol as prescribed.  Patient is requesting to be seen today. Adv her that we do not have anything available. Appt scheduled for 01/26/20 @ 10:30 am with Ward Givens, NP. Adv the patient that she should contact the office in the interim if symptoms develop. Patient is agreeable with the plan and voiced appreciation for the call.

## 2020-01-25 NOTE — ED Notes (Signed)
Pt reported that she was leaving and was going to call Dr. Kirke Corin to view her results and EKG.

## 2020-01-25 NOTE — Telephone Encounter (Signed)
Patient states she thinks she is in afib. States she was in the ED since 10 pm last night. States she left without being seen due to a long wait.   Patient c/o Palpitations:  High priority if patient c/o lightheadedness, shortness of breath, or chest pain  How long have you had palpitations/irregular HR/ Afib? Are you having the symptoms now? Started at 8:30 last night. No symptoms currently  1) Are you currently experiencing lightheadedness, SOB or CP? no  Do you have a history of afib (atrial fibrillation) or irregular heart rhythm?  Yes  2) Have you checked your BP or HR? (document readings if available): states nurse in ED did not tell her  Are you experiencing any other symptoms? No other symptoms.  Patient states she will stay in town, as she would like to come in today.

## 2020-01-26 ENCOUNTER — Encounter: Payer: Self-pay | Admitting: Nurse Practitioner

## 2020-01-26 ENCOUNTER — Ambulatory Visit: Payer: Medicare PPO | Admitting: Nurse Practitioner

## 2020-01-26 ENCOUNTER — Other Ambulatory Visit: Payer: Self-pay

## 2020-01-26 VITALS — BP 152/76 | HR 61 | Ht 63.0 in | Wt 155.0 lb

## 2020-01-26 DIAGNOSIS — I10 Essential (primary) hypertension: Secondary | ICD-10-CM

## 2020-01-26 DIAGNOSIS — N179 Acute kidney failure, unspecified: Secondary | ICD-10-CM

## 2020-01-26 DIAGNOSIS — E876 Hypokalemia: Secondary | ICD-10-CM

## 2020-01-26 DIAGNOSIS — I480 Paroxysmal atrial fibrillation: Secondary | ICD-10-CM

## 2020-01-26 DIAGNOSIS — I471 Supraventricular tachycardia: Secondary | ICD-10-CM

## 2020-01-26 MED ORDER — POTASSIUM CHLORIDE ER 20 MEQ PO TBCR: 20.0000 meq | EXTENDED_RELEASE_TABLET | Freq: Every day | ORAL | 3 refills | Status: DC

## 2020-01-26 NOTE — Patient Instructions (Signed)
Medication Instructions:  1- START K CL (potassium) Take 20 mEq by mouth daily *If you need a refill on your cardiac medications before your next appointment, please call your pharmacy*   Lab Work: 1- Your physician recommends that you return for lab work in: 1 weeks at the medical mall. (BMET)No appt is needed. Hours are M-F 7AM- 6 PM.  If you have labs (blood work) drawn today and your tests are completely normal, you will receive your results only by: Marland Kitchen MyChart Message (if you have MyChart) OR . A paper copy in the mail If you have any lab test that is abnormal or we need to change your treatment, we will call you to review the results.   Testing/Procedures: None ordered    Follow-Up: At [facility], you and your health needs are our priority.  As part of our continuing mission to provide you with exceptional heart care, we have created designated Provider Care Teams.  These Care Teams include your primary Cardiologist (physician) and Advanced Practice Providers (APPs -  Physician Assistants and Nurse Practitioners) who all work together to provide you with the care you need, when you need it.  We recommend signing up for the patient portal called "MyChart".  Sign up information is provided on this After Visit Summary.  MyChart is used to connect with patients for Virtual Visits (Telemedicine).  Patients are able to view lab/test results, encounter notes, upcoming appointments, etc.  Non-urgent messages can be sent to your provider as well.   To learn more about what you can do with MyChart, go to [email or web address].    Your next appointment: as scheduled

## 2020-01-26 NOTE — Progress Notes (Signed)
Office Visit    Patient Name: CASIDEE JANN Date of Encounter: 01/26/2020  Primary Care Provider:  Tacey Ruiz, FNP Primary Cardiologist:  Lorine Bears, MD  Chief Complaint    74 year old female with a history of paroxysmal atrial fibrillation, PSVT, hypertension, hyperlipidemia, and hyperthyroidism, who presents for follow-up related to recurrent palpitations.  Past Medical History    Past Medical History:  Diagnosis Date  . Graves disease   . Hyperlipidemia   . Hypertension   . Hyperthyroidism   . PAF (paroxysmal atrial fibrillation) (HCC)    a. Dx 03/2019 -->AF RVR @ [facility]; b. 04/2019 Zio: <1% Afib burden; c. CHA2DS2VASc = 3-->Eliquis.  . Palpitations    a. 05/2017 Echo: EF 60-65%; b. 05/2017 48h Holter: Rare PACs and PVCs. 11 beat run of SVT w/ aberrancy; c. 04/2019 Zio: Avg HR 56. 37 episodes of SVT, longest 19. Intermittent AF (<1% burden, longest 1:39). Rare PVCs.  Marland Kitchen Positive TB test    Past Surgical History:  Procedure Laterality Date  . CESAREAN SECTION     x 2  . EXCISION METACARPAL MASS Right 12/17/2017   Procedure: EXCISION METACARPAL MASS;  Surgeon: Kennedy Bucker, MD;  Location: [facility] ORS;  Service: Orthopedics;  Laterality: Right;    Allergies  No Known Allergies  History of Present Illness    74 year old female with the above past medical history including paroxysmal atrial fibrillation, PSVT, hypertension, hyperlipidemia, and hyperthyroidism previously on methimazole therapy.  She was previously evaluated in late 2018 for palpitations with monitoring at that time showing rare PACs and PVCs and an 11 beat run of SVT with aberrancy.  Echocardiogram in October 2018 showed normal LV function with grade 1 diastolic dysfunction.  In August 2020, she had recurrent and more prolonged palpitations prompting visit to the emergency department at [facility] where she was found to be in atrial fibrillation with a rapid ventricular response.  Troponin was normal.   She was placed on metoprolol 12.5 mg twice daily and Eliquis.  Cardiology follow-up she was found to be in sinus rhythm.  ZIO monitoring in September 2020 showed an A. fib burden of less than 1% with a longest episode of 1 minute and 39 seconds.  37 runs of SVT were also noted.  She was last seen in clinic in March of this year, at which time she was doing well with only occasional, brief episodes of palpitations.  On June 1, after returning home from [place], she developed tachypalpitations while resting in the evening that persisted about an hour.  She says that it did not feel like her prior episode of A. fib in 2020.  She noted irregular speeding up and slowing down of her heart rhythm without sustained tachycardia.  She contacted our on-call staff and was advised to present to the emergency department.  By the time she was driving to [facility] ED, she noted calming of her heart rhythm and she was in sinus rhythm upon arrival.  Lab work was notable for mild elevation of BUN and creatinine-27/1.11, and mild hypokalemia with potassium of 3.3.  She was feeling well and was in sinus rhythm, she left the emergency department and arrange for this follow-up appointment.  She has not had any recurrent sustained arrhythmias.  She is not really aware of any recurrent palpitations since her ER visit.  She denies chest pain, dyspnea, PND, orthopnea, dizziness, syncope, edema, or early satiety.  Her blood pressure is elevated today but she says she rushed here  after going to the wrong entrance first.  Home Medications    Prior to Admission medications   Medication Sig Start Date End Date Taking? Authorizing Provider  acetaminophen (TYLENOL) 325 MG tablet Take 325 mg by mouth every 6 (six) hours as needed for headache.    Yes [provider]  amLODipine (NORVASC) 5 MG tablet TAKE 1 TABLET(5 MG) BY MOUTH DAILY 03/28/19  Yes Wellington Hampshire, MD  Carboxymethylcellul-Glycerin (LUBRICATING EYE DROPS OP) Place 2  drops into both eyes daily as needed (dry eyes).   Yes [provider]  ELIQUIS 5 MG TABS tablet Take 1 tablet (5 mg total) by mouth 2 (two) times daily. 04/12/19  Yes Dunn, Areta Haber, PA-C  losartan-hydrochlorothiazide (HYZAAR) 50-12.5 MG tablet TAKE 2 TABLETS BY MOUTH DAILY 04/26/19  Yes Rise Mu, PA-C  metoprolol tartrate (LOPRESSOR) 25 MG tablet Take 0.5 tablets (12.5 mg total) by mouth 2 (two) times daily. 05/03/19 01/26/20 Yes Dunn, Areta Haber, PA-C  Multiple Vitamin (MULTIVITAMIN WITH MINERALS) TABS tablet Take 1 tablet by mouth daily.   Yes [provider]  rosuvastatin (CRESTOR) 5 MG tablet Take 5 mg by mouth at bedtime.  04/23/17 01/26/20 Yes [provider]    Review of Systems    Episode of prolonged palpitations as outlined above.  No recurrence since ER visit on June 1.  She denies chest pain, dyspnea, PND, orthopnea, dizziness, syncope, edema, or early satiety.  All other systems reviewed and are otherwise negative except as noted above.  Physical Exam    VS:  BP (!) 152/76 (BP Location: Left Arm, Patient Position: Sitting, Cuff Size: Normal)   Pulse 61   Ht 5\' 3"  (1.6 m)   Wt 155 lb (70.3 kg)   SpO2 99%   BMI 27.46 kg/m  , BMI Body mass index is 27.46 kg/m. GEN: Well nourished, well developed, in no acute distress. HEENT: normal. Neck: Supple, no JVD, carotid bruits, or masses. Cardiac: RRR, no murmurs, rubs, or gallops. No clubbing, cyanosis, edema.  Radials/PT 2+ and equal bilaterally.  Respiratory:  Respirations regular and unlabored, clear to auscultation bilaterally. GI: Soft, nontender, nondistended, BS + x 4. MS: no deformity or atrophy. Skin: warm and dry, no rash. Neuro:  Strength and sensation are intact. Psych: Normal affect.  Accessory Clinical Findings    ECG from January 24, 2020 ER visit personally reviewed by me today -regular sinus rhythm, 78, no acute changes.  Lab Results  Component Value Date   WBC 9.9 01/24/2020   HGB 13.0  01/24/2020   HCT 37.5 01/24/2020   MCV 86.0 01/24/2020   PLT 251 01/24/2020   Lab Results  Component Value Date   CREATININE 1.11 (H) 01/24/2020   BUN 27 (H) 01/24/2020   NA 132 (L) 01/24/2020   K 3.3 (L) 01/24/2020   CL 96 (L) 01/24/2020   CO2 25 01/24/2020   Lab Results  Component Value Date   ALT 14 06/07/2015   AST 17 06/07/2015   ALKPHOS 65 06/07/2015   BILITOT 1.0 06/07/2015   Lab Results  Component Value Date   LDLDIRECT 74.0 06/07/2015    Lab Results  Component Value Date   HGBA1C 5.5 06/07/2015    Assessment & Plan    1.  Paroxysmal atrial fibrillation: Patient was recently evaluated the emergency department after a 1 hour history of palpitations that resolved while driving to the emergency department.  She was in sinus rhythm on arrival.  She is accustomed to experiencing  brief episodes of fluttering or increased heart rate for seconds at a time however the most recent episode lasted about an hour.  She says that symptoms that evening were not similar to prior A. fib or SVT episodes.  She has not had any recurrent palpitations since her ER visit.  I noted mild elevation of BUN and creatinine and also hypokalemia on ER labs.  I recommended improved hydration and have provided her prescription for potassium chloride 20 mill equivalents daily with a plan to follow-up a basic metabolic panel next week.  We discussed that if she has recurrent, prolonged episodes of palpitations despite correction of electrolyte disturbances, we may need to consider repeat monitoring and or antiarrhythmic therapy (likely A1c agent).  Continue low-dose beta-blocker therapy in the setting of baseline bradycardia.  She remains anticoagulated with Eliquis.  2.  Essential hypertension: Blood pressure elevated today though she checks this at home and typically runs in the 120s.  Continue current regimen and I recommended that she follow pressures closely at home.  3.  Hypokalemia: Noted in ED in the  setting of Hyzaar therapy.  Adding potassium chloride 20 mEq today.  Follow-up basic metabolic panel in 1 week.  4.  Acute kidney injury: Mild elevation in BUN and creatinine at at recent ER visit.  She says that she had driven back from [place] and was not hydrating well that day.  Encouraged improved hydration.  5.  PSVT: She did not feel as though the palpitation she experienced on June 1 represented either A. fib or PSVT.  Continue low-dose beta-blocker.  6.  Disposition: Follow-up basic metabolic panel next week.  Follow-up in clinic in 2 to 3 months or sooner if necessary.  Nicolasa Ducking, NP 01/26/2020, 10:52 AM

## 2020-02-02 ENCOUNTER — Telehealth: Payer: Self-pay

## 2020-02-02 ENCOUNTER — Other Ambulatory Visit
Admission: RE | Admit: 2020-02-02 | Discharge: 2020-02-02 | Disposition: A | Discharge status: Home / Self Care | Payer: Medicare PPO | Source: Ambulatory Visit | Attending: Nurse Practitioner | Admitting: Nurse Practitioner

## 2020-02-02 ENCOUNTER — Telehealth: Payer: Self-pay | Admitting: Cardiovascular Disease

## 2020-02-02 DIAGNOSIS — I480 Paroxysmal atrial fibrillation: Secondary | ICD-10-CM

## 2020-02-02 LAB — BASIC METABOLIC PANEL
Anion gap: 5 (ref 5–15)
BUN: 18 mg/dL (ref 8–23)
CO2: 28 mmol/L (ref 22–32)
Calcium: 9.1 mg/dL (ref 8.9–10.3)
Chloride: 95 mmol/L — ABNORMAL LOW (ref 98–111)
Creatinine, Ser: 0.96 mg/dL (ref 0.44–1.00)
GFR calc Af Amer: >60 mL/min (ref >60)
GFR calc non Af Amer: 58 mL/min — ABNORMAL LOW (ref >60)
Glucose, Bld: 105 mg/dL — ABNORMAL HIGH (ref 70–99)
Potassium: 4.4 mmol/L (ref 3.5–5.1)
Sodium: 128 mmol/L — ABNORMAL LOW (ref 135–145)

## 2020-02-02 MED ORDER — LOSARTAN POTASSIUM 100 MG PO TABS
100.0000 mg | ORAL_TABLET | Freq: Every day | ORAL | 5 refills | Status: DC
Start: 2020-02-02 — End: 2020-08-01

## 2020-02-02 MED ORDER — AMLODIPINE BESYLATE 10 MG PO TABS: 10.0000 mg | ORAL_TABLET | Freq: Every day | ORAL | 5 refills | Status: DC

## 2020-02-02 NOTE — Telephone Encounter (Signed)
Readings fwd to Saralyn Pilar, NP to advise

## 2020-02-02 NOTE — Telephone Encounter (Signed)
Please disregard previous advice given return of labwork and hyponatremia in the setting of HCTZ therapy.  Pls refer to phone note for BP med mgmt recs.  Lab Results  Component Value Date   CREATININE 0.96 02/02/2020   BUN 18 02/02/2020   NA 128 (L) 02/02/2020   K 4.4 02/02/2020   CL 95 (L) 02/02/2020   CO2 28 02/02/2020

## 2020-02-02 NOTE — Telephone Encounter (Signed)
Pt c/o BP issue: STAT if pt c/o blurred vision, one-sided weakness or slurred speech  1. What are your last 5 BP readings?  05/31 148/80 HR 61  06/03 152/76  06/04 144/80 HR 60  06/05 142/67 HR 62  06/06 137/77 HR 58  06/07 139/80 HR 60  06/09  151/86  2. Are you having any other symptoms (ex. Dizziness, headache, blurred vision, passed out)? No syptoms  3. What is your BP issue? BP has been high.  Patient saw Flavia Shipper on 06/03 and was told to keep readings, please call to discuss.

## 2020-02-02 NOTE — Telephone Encounter (Signed)
Patient was given medication changes today initiated by Ward Givens, NP  Patient called back to ask if she should also stop Potassium.  Patient sts that she discussed stopping Potassium if her Potassium level returned to normal at her last visit with Thayer Ohm. Patient was instructed to stop her HCTZ today due to hyponatremia.  Advised the patient that I will fwd the message to [facility] and we will call back with his recommendation.  Patient voiced appreciation for the assistance.

## 2020-02-02 NOTE — Telephone Encounter (Signed)
Spoke with the patient. Patient made aware of lab results and Ward Givens, NP recommendation.  Patient sts that she is taking 2 tablets od Losartan HCTZ 50/12.5mg  daily not one. Updated Thayer Ohm. Chris amended the patients instructions as follows:  STOP Losartan HCTZ START Losartan 100 mg daily INCREASE Amlodipine to 10 mg daily.  Rx has been sent to the patients pharmacy.  Advised the patient to continue to monitor her BP daily around the same time of day atleast 2-3 hours after taking her medication. Advised the patient to call the office next week to report her BP and HR readings.  Patient verbalized understanding and voiced appreciation for the call.

## 2020-02-02 NOTE — Telephone Encounter (Signed)
-----   Message from Creig Hines, NP sent at 02/02/2020 12:30 PM EDT ----- Renal fxn, K stable.  Na is low @ 128.  She is currently on HCTZ in the form of losartan HCTZ.  I'd like to switch this to losartan 100mg  daily (without HCTZ).  BP has been trending higher @ home and thus the reason for increasing losartan.  Cont to check BP @ home as we may need to increase amlodipine if still trending up.

## 2020-02-02 NOTE — Telephone Encounter (Signed)
Blood pressures higher than goal.  Please increase amlodipine to 10 mg daily.

## 2020-02-03 NOTE — Telephone Encounter (Signed)
Yes, she may stop potassium.

## 2020-02-03 NOTE — Telephone Encounter (Signed)
Call to patient to make her aware of changes per Ward Givens, NP.   Med d/c. No further orders at this time.

## 2020-02-03 NOTE — Addendum Note (Signed)
Addended by: Efrain Sella on: 02/03/2020 11:47 AM   Modules accepted: Orders

## 2020-02-06 ENCOUNTER — Telehealth: Payer: Self-pay | Admitting: Cardiovascular Disease

## 2020-02-06 NOTE — Telephone Encounter (Signed)
Spoke to patinet. Increase started on Thursday. I told her as long as she continues not having and headache, change of vision, chest pain or dizziness, okay to wait to reevaluate at the 7-10 day mark.   Decrease stress when able.   She will continue vs monitoring and let us know if VS do not improve with time and relaxing measure.   Advised pt to call for any further questions or concerns.

## 2020-02-06 NOTE — Telephone Encounter (Signed)
After seeing Doris [name] last week patient started amlodipine 10 mg, an increase from the 5 mg before. Patients BP is now concerned about high BP. Patient first took the increased amount Thursday.    Pt c/o BP issue: STAT if pt c/o blurred vision, one-sided weakness or slurred speech  1. What are your last 5 BP readings?  6/12 pm  142/78  60 6/13 pm 142/79  61 6/14 (midnight) 160/86 65  2. Are you having any other symptoms (ex. Dizziness, headache, blurred vision, passed out)? no  3. What is your BP issue? Very high. Wants to know if medication will take some time to adjust further

## 2020-02-09 ENCOUNTER — Telehealth: Payer: Self-pay | Admitting: Nurse Practitioner

## 2020-02-09 DIAGNOSIS — I10 Essential (primary) hypertension: Secondary | ICD-10-CM

## 2020-02-09 DIAGNOSIS — Z79899 Other long term (current) drug therapy: Secondary | ICD-10-CM

## 2020-02-09 NOTE — Telephone Encounter (Signed)
Patient is calling with BP results:  6/10- 133/78 HR 59 6/12- 142/78 HR 60 6/13- 142/79 HR 61 6/14- 148/85 HR 65 6/15- 130/75 HR 62 6/16- 148/85 HR 68  States her ankles were swollen yesterday  Pt c/o swelling: STAT is pt has developed SOB within 24 hours  1) How much weight have you gained and in what time span? no  2) If swelling, where is the swelling located? Bilateral ankles  3) Are you currently taking a fluid pill? Was taking one, but stopped per Ward Givens, NP  4) Are you currently SOB? No  5) Do you have a log of your daily weights (if so, list)? no  6) Have you gained 3 pounds in a day or 5 pounds in a week? no  7) Have you traveled recently? no

## 2020-02-09 NOTE — Telephone Encounter (Signed)
Spoke with patient. Reports minor swelling in her ankles yesterday. Today it has returned to normal. Denies weight gain, shortness of breath, dizziness or headache or palpitations.  She is concerned about her SBP >140 for several days this week. Does not want it to "kick my heart back into aFib." Reviewed med list and it is correct as to what patient is taking. Recently, amlodipine increased to 10 mg daily and HCTZ removed from losartan combo pill.  Reports some stress in her life as she is planning a wedding for October and has inherited a 64 years old family friend.  Advised her to elevated her ankles when able and to wear compression socks throughout the day. She will continue to monitor the swelling and let us know if it returns or worsens. Routing to Ward Givens, NP to review for any further recommendations.

## 2020-02-09 NOTE — Telephone Encounter (Signed)
Amlodipine may contribute to mild ankle swelling.  We stopped HCTZ due to low sodium level.  Blood pressures are generally lower than what Doris [name] reported 6/10 (prior to med changes) and 6/14. If swelling is bothersome, ok to go back to amlodipine 5mg  daily and add spironolactone 12.5mg  daily. Spironolactone will help with both swelling and bp.  Ensure that Doris [name] is not taking potassium supplement since we stopped hctz.  Will need a bmet next week.

## 2020-02-10 MED ORDER — SPIRONOLACTONE 25 MG PO TABS: 12.5000 mg | ORAL_TABLET | Freq: Every day | ORAL | 1 refills | Status: DC

## 2020-02-10 MED ORDER — AMLODIPINE BESYLATE 10 MG PO TABS: 5.0000 mg | ORAL_TABLET | Freq: Every day | ORAL | 5 refills | Status: DC

## 2020-02-10 NOTE — Telephone Encounter (Signed)
Patient verbalized understanding of Ward Givens, NP recommendations as follows: Decrease amlodipine to 5 mg daily (she will take 0.5 tablet of the 10 mg) Start spironolactone 12.5 mg daily. Get repeat lab work in one week (on June 28th as she will start the new regimen tomorrow morning)  Med list updated and Rx sent to pharmacy.

## 2020-02-20 ENCOUNTER — Other Ambulatory Visit
Admission: RE | Admit: 2020-02-20 | Discharge: 2020-02-20 | Disposition: A | Discharge status: Home / Self Care | Payer: Medicare PPO | Attending: Nurse Practitioner | Admitting: Nurse Practitioner

## 2020-02-20 DIAGNOSIS — I10 Essential (primary) hypertension: Secondary | ICD-10-CM | POA: Diagnosis present

## 2020-02-20 DIAGNOSIS — Z79899 Other long term (current) drug therapy: Secondary | ICD-10-CM | POA: Diagnosis present

## 2020-02-20 LAB — BASIC METABOLIC PANEL
Anion gap: 8 (ref 5–15)
BUN: 22 mg/dL (ref 8–23)
CO2: 27 mmol/L (ref 22–32)
Calcium: 9.5 mg/dL (ref 8.9–10.3)
Chloride: 101 mmol/L (ref 98–111)
Creatinine, Ser: 1 mg/dL (ref 0.44–1.00)
GFR calc Af Amer: >60 mL/min (ref >60)
GFR calc non Af Amer: 55 mL/min — ABNORMAL LOW (ref >60)
Glucose, Bld: 89 mg/dL (ref 70–99)
Potassium: 4.6 mmol/L (ref 3.5–5.1)
Sodium: 136 mmol/L (ref 135–145)

## 2020-02-24 ENCOUNTER — Telehealth: Payer: Self-pay | Admitting: Cardiovascular Disease

## 2020-02-24 DIAGNOSIS — I480 Paroxysmal atrial fibrillation: Secondary | ICD-10-CM

## 2020-02-24 NOTE — Telephone Encounter (Signed)
Patient would like a referral to see Dr. Graciela Husbands. She does not want to stop seeing Dr. Kirke Corin, she was recommended by her husbands doctor, Dr. Claud Kelp, that Dr. Graciela Husbands would be a good fit for her for afib. Please advise.

## 2020-02-27 NOTE — Telephone Encounter (Signed)
Referral to Dr. Graciela Husbands is fine.

## 2020-02-28 NOTE — Telephone Encounter (Signed)
Patient has been scheduled with Graciela Husbands 9/16.  Patient also wanted a call about lab work by 6/28

## 2020-02-28 NOTE — Telephone Encounter (Signed)
EP referral placed in Epic. Msg fwd to scheduling to call the patient to schedule the EP consult with Dr.Klein.

## 2020-02-28 NOTE — Telephone Encounter (Signed)
Spoke with the patient and reviewed her 02/20/20 bmet results.  Doris Hines, NP  02/20/2020 12:50 PM EDT     Sodium normal after stopping HCTZ. Kidney fxn and other lytes also look good.

## 2020-03-22 ENCOUNTER — Other Ambulatory Visit: Payer: Self-pay | Admitting: Physician Assistant

## 2020-03-22 NOTE — Telephone Encounter (Signed)
Refill request

## 2020-04-03 ENCOUNTER — Telehealth: Payer: Self-pay | Admitting: Cardiovascular Disease

## 2020-04-03 MED ORDER — ELIQUIS 5 MG PO TABS: ORAL_TABLET | ORAL | 2 refills | Status: DC

## 2020-04-03 NOTE — Telephone Encounter (Signed)
*  STAT* If patient is at the pharmacy, call can be transferred to refill team.   1. Which medications need to be refilled? (please list name of each medication and dose if known)   Eliquis 5 mg po BID  2. Which pharmacy/location (including street and city if local pharmacy) is medication to be sent to?  [facility] [place]  3. Do they need a 30 day or 90 day supply? 90

## 2020-04-03 NOTE — Telephone Encounter (Signed)
Please review for refill, Thanks !  

## 2020-04-03 NOTE — Telephone Encounter (Addendum)
Eliquis 5mg  refill request received. Patient is 74 years old, weight-70.3kg, Crea-1.00 on 02/20/2020, Diagnosis-Afib, and last seen by 02/22/2020 on 01/26/2020. Dose is appropriate based on dosing criteria. Will send in refill to requested pharmacy.

## 2020-04-21 ENCOUNTER — Other Ambulatory Visit: Payer: Self-pay | Admitting: Physician Assistant

## 2020-04-23 ENCOUNTER — Other Ambulatory Visit: Payer: Self-pay

## 2020-04-23 MED ORDER — METOPROLOL TARTRATE 25 MG PO TABS: 12.5000 mg | ORAL_TABLET | Freq: Two times a day (BID) | ORAL | 0 refills | Status: DC

## 2020-04-23 NOTE — Telephone Encounter (Signed)
*  STAT* If patient is at the pharmacy, call can be transferred to refill team.   1. Which medications need to be refilled? (please list name of each medication and dose if known) Metoprolol  2. Which pharmacy/location (including street and city if local pharmacy) is medication to be sent to? [facility] [place]  3. Do they need a 30 day or 90 day supply? 90   

## 2020-05-01 ENCOUNTER — Telehealth: Payer: Self-pay | Admitting: Cardiovascular Disease

## 2020-05-01 NOTE — Telephone Encounter (Signed)
Pt c/o medication issue:  1. Name of Medication: spirolactone   2. How are you currently taking this medication (dosage and times per day)? 12.5 mg day  3. Are you having a reaction (difficulty breathing--STAT)? no  4. What is your medication issue? Patient having issue with cutting pills in half since they are so small. Patient wanting to know if she does not get it exactly in half if it is okay to take the "bigger half" and then the lesser the next day. Patient states she is not noticing any issues.   Please advise

## 2020-05-01 NOTE — Telephone Encounter (Signed)
Spoke with the patient. Patient sts that she has a pill cutter and has been cutting her 25 mg Spironolactone tablet in 1/2 for her prescribed dosage of 12.5 mg daily.  Patient sts that sometimes after cutting the tablet that tablet is not exactly a 1/2, patient wants to know if that is acceptable.  Adv the patient that it is. Adv her that she could check with her pharmacy to see if they could cut the tablets in 1/2 for her.  Patient verbalized understanding and voiced appreciation for the call back.

## 2020-05-09 NOTE — Progress Notes (Signed)
ELECTROPHYSIOLOGY CONSULT NOTE  Patient ID: Doris [name], MRN: [id number], DOB/AGE: 05/03/1946 74 y.o. Admit date: (Not on file) Date of Consult: 05/10/2020  Primary Physician: Doris Ruiz, FNP Primary Cardiologist: MA     Doris [name] is a 74 y.o. female who is being seen today for the evaluation of AFib at the request of DrMA.    HPI Doris [name] is a 74 y.o. female seen for atrial fibrillation at the request of Dr. Kennith Maes.  She was identified as such 8/20 when she presented to [facility] emergency room with rapid atrial fibrillation.  Treated with metoprolol started on anticoagulation and reverted spontaneously to sinus rhythm.  Has not had any atrial fibrillation of which she is aware since then.  The patient denies chest pain, shortness of breath, nocturnal dyspnea, orthopnea or peripheral edema.  There have been no palpitations, lightheadedness or syncope.    Past history of Graves' disease  DATE TEST EF   10/18 Echo   60 % PA sys 38  9/20 Echo   60 % Pulm Htn PAsys 40        Date Cr K Hgb  6/21 1.0 4.6 13.0         Event Recorder personnally reviewed atrial fib Dur <2 min; atrial tach non-sustained freq events but brief  Thromboembolic risk factors ( age  -2, HTN-1, Gender-1) for a CHADSVASc Score of >=4  Her husband is followed by Doris [name].  He was a Doctor, hospital who had a cardiac arrest during treadmill testing and is now with a defibrillator  Past Medical History:  Diagnosis Date  . Graves disease   . Hyperlipidemia   . Hypertension   . Hyperthyroidism   . PAF (paroxysmal atrial fibrillation) (HCC)    a. Dx 03/2019 -->AF RVR @ [facility]; b. 04/2019 Zio: <1% Afib burden; c. CHA2DS2VASc = 3-->Eliquis.  . Palpitations    a. 05/2017 Echo: EF 60-65%; b. 05/2017 48h Holter: Rare PACs and PVCs. 11 beat run of SVT w/ aberrancy; c. 04/2019 Zio: Avg HR 56. 37 episodes of SVT, longest 19. Intermittent AF (<1% burden, longest 1:39). Rare  PVCs.  Marland Kitchen Positive TB test       Surgical History:  Past Surgical History:  Procedure Laterality Date  . CESAREAN SECTION     x 2  . EXCISION METACARPAL MASS Right 12/17/2017   Procedure: EXCISION METACARPAL MASS;  Surgeon: Kennedy Bucker, MD;  Location: [facility] ORS;  Service: Orthopedics;  Laterality: Right;     Home Meds: Current Meds  Medication Sig  . acetaminophen (TYLENOL) 325 MG tablet Take 325 mg by mouth every 6 (six) hours as needed for headache.   Marland Kitchen amLODipine (NORVASC) 5 MG tablet Take 5 mg by mouth daily.  . Carboxymethylcellul-Glycerin (LUBRICATING EYE DROPS OP) Place 2 drops into both eyes daily as needed (dry eyes).  Marland Kitchen ELIQUIS 5 MG TABS tablet TAKE 1 TABLET(5 MG) BY MOUTH TWICE DAILY  . metoprolol tartrate (LOPRESSOR) 25 MG tablet Take 0.5 tablets (12.5 mg total) by mouth 2 (two) times daily.  . Multiple Vitamin (MULTIVITAMIN WITH MINERALS) TABS tablet Take 1 tablet by mouth daily.  Marland Kitchen spironolactone (ALDACTONE) 25 MG tablet Take 0.5 tablets (12.5 mg total) by mouth daily.    Allergies: No Known Allergies  Social History   Socioeconomic History  . Marital status: Married    Spouse name: Not on file  . Number of children: Not on file  . Years of  education: Not on file  . Highest education level: Not on file  Occupational History  . Not on file  Tobacco Use  . Smoking status: Never Smoker  . Smokeless tobacco: Never Used  Vaping Use  . Vaping Use: Never used  Substance and Sexual Activity  . Alcohol use: No    Alcohol/week: 0.0 standard drinks  . Drug use: No  . Sexual activity: Yes  Other Topics Concern  . Not on file  Social History Narrative   Grew up in [place]. Lives with husband in a 2 story home.  Has 5 children. (1 set of twins)   Retired Architectural technologist.     Education: some college.    Social Determinants of Health   Financial Resource Strain:   . Difficulty of Paying Living Expenses: Not on file  Food Insecurity:   . Worried About  Programme researcher, broadcasting/film/video in the Last Year: Not on file  . Ran Out of Food in the Last Year: Not on file  Transportation Needs:   . Lack of Transportation (Medical): Not on file  . Lack of Transportation (Non-Medical): Not on file  Physical Activity:   . Days of Exercise per Week: Not on file  . Minutes of Exercise per Session: Not on file  Stress:   . Feeling of Stress : Not on file  Social Connections:   . Frequency of Communication with Friends and Family: Not on file  . Frequency of Social Gatherings with Friends and Family: Not on file  . Attends Religious Services: Not on file  . Active Member of Clubs or Organizations: Not on file  . Attends Banker Meetings: Not on file  . Marital Status: Not on file  Intimate Partner Violence:   . Fear of Current or Ex-Partner: Not on file  . Emotionally Abused: Not on file  . Physically Abused: Not on file  . Sexually Abused: Not on file     Family History  Problem Relation Age of Onset  . Cancer Mother        Breast Cancer  . Cancer Father        throat Cancer  . Thyroid cancer Daughter      ROS:  Please see the history of present illness.     All other systems reviewed and negative.    Physical Exam:  Blood pressure (!) 148/85, pulse 61, height 5\' 3"  (1.6 m), weight 158 lb 9.6 oz (71.9 kg), SpO2 99 %. General: Well developed, well nourished female in no acute distress. Head: Normocephalic, atraumatic, sclera non-icteric, no xanthomas, nares are without discharge. EENT: normal  Lymph Nodes:  none Neck: Negative for carotid bruits. JVD not elevated. Back:without scoliosis kyphosis  Lungs: Clear bilaterally to auscultation without wheezes, rales, or rhonchi. Breathing is unlabored. Heart: RRR with S1 S2. No  murmur . No rubs, or gallops appreciated. Abdomen: Soft, non-tender, non-distended with normoactive bowel sounds. No hepatomegaly. No rebound/guarding. No obvious abdominal masses. Msk:  Strength and tone appear  normal for age. Extremities: No clubbing or cyanosis. No edema.  Distal pedal pulses are 2+ and equal bilaterally. Skin: Warm and Dry Neuro: Alert and oriented X 3. CN III-XII intact Grossly normal sensory and motor function . Psych:  Responds to questions appropriately with a normal affect.      Labs: Cardiac Enzymes No results for input(s): CKTOTAL, CKMB, TROPONINI in the last 72 hours. CBC Lab Results  Component Value Date   WBC 9.9 01/24/2020  HGB 13.0 01/24/2020   HCT 37.5 01/24/2020   MCV 86.0 01/24/2020   PLT 251 01/24/2020   PROTIME: No results for input(s): LABPROT, INR in the last 72 hours. Chemistry No results for input(s): NA, K, CL, CO2, BUN, CREATININE, CALCIUM, PROT, BILITOT, ALKPHOS, ALT, AST, GLUCOSE in the last 168 hours.  Invalid input(s): LABALBU Lipids No results found for: CHOL, HDL, LDLCALC, TRIG BNP No results found for: PROBNP Thyroid Function Tests: No results for input(s): TSH, T4TOTAL, T3FREE, THYROIDAB in the last 72 hours.  Invalid input(s): FREET3 Miscellaneous No results found for: DDIMER  Radiology/Studies:  No results found.  EKG: Sinus at 62 Intervals 13/07/40   Assessment and Plan:  Afib paroxysmal  SVT-atrial tachycardia  Hypertension   Patient has paroxysmal atrial fibrillation on anticoagulation with Eliquis without significant bleeding.  In the absence of symptoms, no further interventions are indicated.  We will continue her on her anticoagulation.  Discussed the physiology and pathophysiology of atrial fibrillation and the need for anticoagulation.  Her atrial tachycardia is asymptomatic.  We will continue her beta-blocker.   Sherryl Manges

## 2020-05-10 ENCOUNTER — Other Ambulatory Visit: Payer: Self-pay

## 2020-05-10 ENCOUNTER — Encounter: Payer: Self-pay | Admitting: Internal Medicine

## 2020-05-10 ENCOUNTER — Ambulatory Visit: Payer: Medicare PPO | Admitting: Internal Medicine

## 2020-05-10 VITALS — BP 148/85 | HR 61 | Ht 63.0 in | Wt 158.6 lb

## 2020-05-10 DIAGNOSIS — I10 Essential (primary) hypertension: Secondary | ICD-10-CM | POA: Diagnosis not present

## 2020-05-10 DIAGNOSIS — I480 Paroxysmal atrial fibrillation: Secondary | ICD-10-CM

## 2020-05-10 DIAGNOSIS — I471 Supraventricular tachycardia: Secondary | ICD-10-CM

## 2020-05-10 NOTE — Patient Instructions (Signed)

## 2020-05-15 ENCOUNTER — Encounter: Payer: Self-pay | Admitting: Cardiovascular Disease

## 2020-05-15 ENCOUNTER — Other Ambulatory Visit: Payer: Self-pay

## 2020-05-15 ENCOUNTER — Ambulatory Visit: Payer: Medicare PPO | Admitting: Cardiovascular Disease

## 2020-05-15 VITALS — BP 138/70 | HR 65 | Ht 63.0 in | Wt 157.5 lb

## 2020-05-15 DIAGNOSIS — I10 Essential (primary) hypertension: Secondary | ICD-10-CM

## 2020-05-15 DIAGNOSIS — I480 Paroxysmal atrial fibrillation: Secondary | ICD-10-CM

## 2020-05-15 DIAGNOSIS — E785 Hyperlipidemia, unspecified: Secondary | ICD-10-CM | POA: Diagnosis not present

## 2020-05-15 NOTE — Progress Notes (Signed)
Cardiology Office Note   Date:  05/15/2020   ID:  Elbony, Mcclimans [birth year], MRN [id number]  PCP:  Tacey Ruiz, FNP  Cardiologist:   Lorine Bears, MD   Chief Complaint  Patient presents with  . OTHER    6 month f/u no complaints today. Meds reviewed verbally with pt.      History of Present Illness: Doris [name] is a 74 y.o. female who presents for  a followup visit regarding paroxysmal atrial fibrillation and hypertension.  She has known history of essential hypertension, hyperlipidemia and hypothyroidism. She was diagnosed with atrial fibrillation in August 2020 after she presented to [facility] ED with palpitations.  Her heart rate was 169 bpm.  She was discharged on small dose metoprolol and Eliquis and has done well since then.   Echocardiogram in September 2020 showed an EF of 60 to 65% with mild pulmonary hypertension.  She had issues with uncontrolled hypertension recently and was treated with hydrochlorothiazide which caused mild hyponatremia.  It was switched to spironolactone with subsequent improvement.  She is doing well with no chest pain, shortness of breath or leg edema.  She had recent tachycardia but did not last more than a few minutes.  Past Medical History:  Diagnosis Date  . Graves disease   . Hyperlipidemia   . Hypertension   . Hyperthyroidism   . PAF (paroxysmal atrial fibrillation) (HCC)    a. Dx 03/2019 -->AF RVR @ [facility]; b. 04/2019 Zio: <1% Afib burden; c. CHA2DS2VASc = 3-->Eliquis.  . Palpitations    a. 05/2017 Echo: EF 60-65%; b. 05/2017 48h Holter: Rare PACs and PVCs. 11 beat run of SVT w/ aberrancy; c. 04/2019 Zio: Avg HR 56. 37 episodes of SVT, longest 19. Intermittent AF (<1% burden, longest 1:39). Rare PVCs.  Marland Kitchen Positive TB test     Past Surgical History:  Procedure Laterality Date  . CESAREAN SECTION     x 2  . EXCISION METACARPAL MASS Right 12/17/2017   Procedure: EXCISION METACARPAL MASS;  Surgeon: Kennedy Bucker, MD;  Location: [facility]  ORS;  Service: Orthopedics;  Laterality: Right;     Current Outpatient Medications  Medication Sig Dispense Refill  . acetaminophen (TYLENOL) 325 MG tablet Take 325 mg by mouth every 6 (six) hours as needed for headache.     Marland Kitchen amLODipine (NORVASC) 5 MG tablet Take 5 mg by mouth daily.    . Carboxymethylcellul-Glycerin (LUBRICATING EYE DROPS OP) Place 2 drops into both eyes daily as needed (dry eyes).    Marland Kitchen ELIQUIS 5 MG TABS tablet TAKE 1 TABLET(5 MG) BY MOUTH TWICE DAILY 180 tablet 2  . losartan (COZAAR) 100 MG tablet Take 1 tablet (100 mg total) by mouth daily. 30 tablet 5  . metoprolol tartrate (LOPRESSOR) 25 MG tablet Take 0.5 tablets (12.5 mg total) by mouth 2 (two) times daily. 90 tablet 0  . Multiple Vitamin (MULTIVITAMIN WITH MINERALS) TABS tablet Take 1 tablet by mouth daily.    . rosuvastatin (CRESTOR) 5 MG tablet Take 5 mg by mouth at bedtime.     Marland Kitchen spironolactone (ALDACTONE) 25 MG tablet Take 0.5 tablets (12.5 mg total) by mouth daily. 45 tablet 1   No current facility-administered medications for this visit.    Allergies:   Patient has no known allergies.    Social History:  The patient  reports that she has never smoked. She has never used smokeless tobacco. She reports that she does not drink alcohol and does not use  drugs.   Family History:  The patient's family history includes Cancer in her father and mother; Thyroid cancer in her daughter.    ROS:  Please see the history of present illness.   Otherwise, review of systems are positive for none.   All other systems are reviewed and negative.    PHYSICAL EXAM: VS:  BP 138/70 (BP Location: Left Arm, Patient Position: Sitting, Cuff Size: Normal)   Pulse 65   Ht 5\' 3"  (1.6 m)   Wt 157 lb 8 oz (71.4 kg)   SpO2 98%   BMI 27.90 kg/m  , BMI Body mass index is 27.9 kg/m. GEN: Well nourished, well developed, in no acute distress  HEENT: normal  Neck: no JVD, carotid bruits, or masses Cardiac: RRR; no  rubs, or  gallops,no edema . 1/ 6 systolic ejection murmur in the pulmonic area which is early peaking. Respiratory:  clear to auscultation bilaterally, normal work of breathing GI: soft, nontender, nondistended, + BS MS: no deformity or atrophy  Skin: warm and dry, no rash Neuro:  Strength and sensation are intact Psych: euthymic mood, full affect   EKG:  EKG is not  ordered today.    Recent Labs: 11/24/2019: TSH 0.71 01/24/2020: Hemoglobin 13.0; Platelets 251 02/20/2020: BUN 22; Creatinine, Ser 1.00; Potassium 4.6; Sodium 136    Lipid Panel    Component Value Date/Time   LDLDIRECT 74.0 06/07/2015 1057      Wt Readings from Last 3 Encounters:  05/15/20 157 lb 8 oz (71.4 kg)  05/10/20 158 lb 9.6 oz (71.9 kg)  01/26/20 155 lb (70.3 kg)      ASSESSMENT AND PLAN:  1.  Paroxysmal atrial fibrillation: Doing well overall on metoprolol 12.5 mg twice daily.  She is tolerating anticoagulation with no side effects.  Given overall low burden, no indication for ablation or antiarrhythmic medication.  2.  Essential hypertension: Blood pressure is now well controlled after adjusting her medications with good tolerance so far.  3.  Hyperlipidemia: On rosuvastatin.    Disposition:   FU with me in 6 months  Signed,  03/27/20, MD  05/15/2020 3:12 PM    [facility]

## 2020-05-15 NOTE — Patient Instructions (Signed)

## 2020-05-16 ENCOUNTER — Telehealth: Payer: Self-pay | Admitting: Pulmonary Disease

## 2020-05-16 NOTE — Telephone Encounter (Signed)
Noted.  Will close encounter.  

## 2020-05-16 NOTE — Telephone Encounter (Signed)
Pt calling to state that she has gotten the information that she needed. Nothing further needed.

## 2020-06-12 ENCOUNTER — Other Ambulatory Visit: Payer: Self-pay | Admitting: Cardiovascular Disease

## 2020-06-12 NOTE — Telephone Encounter (Signed)
Please advise if ok to refill Historical medication. 

## 2020-06-12 NOTE — Telephone Encounter (Signed)
Ok to refill. Patient was taking at last office visit with Dr Kirke Corin and our providers have prescribed multiple times in the past.

## 2020-07-16 ENCOUNTER — Other Ambulatory Visit: Payer: Self-pay | Admitting: Cardiovascular Disease

## 2020-07-16 MED ORDER — SPIRONOLACTONE 25 MG PO TABS: 12.5000 mg | ORAL_TABLET | Freq: Every day | ORAL | 1 refills | Status: DC

## 2020-07-16 MED ORDER — METOPROLOL TARTRATE 25 MG PO TABS: 12.5000 mg | ORAL_TABLET | Freq: Two times a day (BID) | ORAL | 0 refills | Status: DC

## 2020-07-16 NOTE — Addendum Note (Signed)
Addended by: Theola Sequin on: 07/16/2020 02:16 PM   Modules accepted: Orders

## 2020-07-16 NOTE — Telephone Encounter (Signed)
*  STAT* If patient is at the pharmacy, call can be transferred to refill team.   1. Which medications need to be refilled? (please list name of each medication and dose if known) spironolactone 25 mg (1/2 tablet daily), metoprolol 25 mg (1/2 tablet bid)  2. Which pharmacy/location (including street and city if local pharmacy) is medication to be sent to? [facility] in [place]  3. Do they need a 30 day or 90 day supply? 90

## 2020-07-20 ENCOUNTER — Other Ambulatory Visit: Payer: Self-pay | Admitting: Physician Assistant

## 2020-08-01 ENCOUNTER — Other Ambulatory Visit: Payer: Self-pay | Admitting: Cardiovascular Disease

## 2020-08-01 MED ORDER — LOSARTAN POTASSIUM 100 MG PO TABS: 100.0000 mg | ORAL_TABLET | Freq: Every day | ORAL | 3 refills | Status: DC

## 2020-08-01 NOTE — Telephone Encounter (Signed)
*  STAT* If patient is at the pharmacy, call can be transferred to refill team.   1. Which medications need to be refilled? (please list name of each medication and dose if known)   Losartan 100 mg po q d   2. Which pharmacy/location (including street and city if local pharmacy) is medication to be sent to?    [facility] [place]   3. Do they need a 30 day or 90 day supply? 90

## 2020-08-29 ENCOUNTER — Telehealth: Payer: Self-pay

## 2020-08-29 NOTE — Telephone Encounter (Signed)
Received order for O2 from [facility].  Patient last seen 10/05/2019 and was instructed to f/u in 1 week. No pending appt.   Attempted to call patient to schedule ov. Unable to vm due to mailbox not being setup.

## 2020-08-30 NOTE — Telephone Encounter (Signed)
appt scheduled for 09/27/2020 at 3:30. Patient is aware and voiced her understanding.  Nothing further needed.

## 2020-09-07 ENCOUNTER — Telehealth: Payer: Self-pay | Admitting: Internal Medicine

## 2020-09-07 NOTE — Telephone Encounter (Signed)
Patient called stating she saw her PCP and they informed her that her thyroid was low - patient wanted to let Dr Elvera Lennox know. Patient requested an appointment for blood work to be done here so I scheduled this already. Ph# [phone]

## 2020-09-10 NOTE — Telephone Encounter (Signed)
I reviewed the labs  - TSH was slightly low - yes, I will need to see her to discuss and probably to repeat the labs as I have not seen her in 1.5 years. Ty, C

## 2020-09-11 NOTE — Telephone Encounter (Signed)
Called and scheduled pt for an office visit  

## 2020-09-12 ENCOUNTER — Other Ambulatory Visit: Payer: Medicare PPO

## 2020-09-13 ENCOUNTER — Other Ambulatory Visit: Payer: Self-pay

## 2020-09-13 ENCOUNTER — Ambulatory Visit: Payer: Medicare PPO | Admitting: Internal Medicine

## 2020-09-13 ENCOUNTER — Encounter: Payer: Self-pay | Admitting: Internal Medicine

## 2020-09-13 ENCOUNTER — Telehealth: Payer: Self-pay | Admitting: Internal Medicine

## 2020-09-13 VITALS — BP 120/88 | HR 63 | Ht 63.0 in | Wt 163.0 lb

## 2020-09-13 DIAGNOSIS — E0500 Thyrotoxicosis with diffuse goiter without thyrotoxic crisis or storm: Secondary | ICD-10-CM | POA: Diagnosis not present

## 2020-09-13 LAB — TSH: TSH: 0.15 u[IU]/mL — ABNORMAL LOW (ref 0.35–4.50)

## 2020-09-13 LAB — T4, FREE: Free T4: 1.03 ng/dL (ref 0.60–1.60)

## 2020-09-13 LAB — T3, FREE: T3, Free: 3.6 pg/mL (ref 2.3–4.2)

## 2020-09-13 MED ORDER — METHIMAZOLE 5 MG PO TABS: 2.5000 mg | ORAL_TABLET | Freq: Every day | ORAL | 5 refills | Status: DC

## 2020-09-13 NOTE — Patient Instructions (Signed)
Please continue off methimazole until the results are back.  Please stop at the lab.  Please come back for a follow-up appointment in 6 months.

## 2020-09-13 NOTE — Telephone Encounter (Signed)
Pt called because after appt today Dr said she would let her know if she started any new meds and was confused what that meant and if she did start any new meds. She stated she would like to speak to her nurse. Please Advise.  Ph# [phone]

## 2020-09-13 NOTE — Progress Notes (Signed)
Patient ID: Doris [name], female   DOB: December 10, 1945, 75 y.o.   MRN: [id number]  This visit occurred during the SARS-CoV-2 public health emergency.  Safety protocols were in place, including screening questions prior to the visit, additional usage of staff PPE, and extensive cleaning of exam room while observing appropriate contact time as indicated for disinfecting solutions.   HPI  Doris [name] is a 75 y.o.-year-old very pleasant female, initially referred by her PCP, Tacey Ruiz, NP ([facility]), presenting for follow-up for Graves ds. Last visit 1 year and 7 months ago (virtual).  Reviewed and addended history: She had a gastroenteritis on 10/25/2014 >> felt weak after this. She presented to [facility] for the weakness - TSH found low mid-March. She then had the TSH rechecked at a visit with PCP: TSH still low.   She had weakness in the past with her thyrotoxic episodes, including winter 2016 >> TFTs abnormal >> restarted MMI 5 mg daily in 10/2015 >> we were able to decrease the dose to 2.5 mg daily (5 mg qod) in 12/2015.   12/2017: TFTs were normal on 2.5 mg of methimazole daily.  01/2019: We stopped methimazole after a TSH returned slightly elevated  TFTs were normal afterwards until 08/2020. She has chronic sinusitis with frequent exacerbations.   She had a period of 3 months of increased stress since last visit starting with her husband being sick, her son's wedding, and all the holidays.  Her blood pressure increased at the end of this period, but now improved.  Reviewed patient's TFTs: 09/07/2020: TSH 0.173 (0.6-3.3) Lab Results  Component Value Date   TSH 0.71 11/24/2019   TSH 1.78 03/09/2019   TSH 2.68 01/11/2018   TSH 3.04 10/02/2017   TSH 4.46 05/26/2017   FREET4 0.94 11/24/2019   FREET4 0.72 03/09/2019   FREET4 0.84 01/11/2018   FREET4 0.79 10/02/2017   FREET4 0.86 05/26/2017  04/06/2019: TSH 2.14, free T4 1.18, free T3 2.95, all  normal 02/10/2019: TSH 6.6 11/21/2014: TSH 0.229 (0.450-4.5), TT4 11.9 (4.5-12)  11/04/2014: TSH 0.326 02/2014: TSH 2.530  Her TSI level was normal at last check: Lab Results  Component Value Date   TSI 108 01/11/2018    05/29/2015: Thyroid uptake and scan: The 24 hour radioactive iodine uptake is equal to 40.9%. There is uniform tracer uptake throughout both lobes. No dominant hot or cold nodule identified. IMPRESSION: 1. Elevated 24 hour radioactive uptake equal 40.9%. 2. Patient's hyperthyroidism may be due to Grave's disease.  She saw Dr. Kirke Corin in the past for palpitations.  Pt denies: - feeling nodules in neck - hoarseness - dysphagia - choking - SOB with lying down  + Family history of thyroid cancer in daughter. No h/o radiation tx to head or neck.  No seaweed or kelp. No recent contrast studies. No herbal supplements. No Biotin use. No recent steroids use.   She has 2 daughters (one in [place] and one near [place]).  ROS: Constitutional: + weight gain/no weight loss, no fatigue, no subjective hyperthermia, no subjective hypothermia Eyes: no blurry vision, no xerophthalmia ENT: no sore throat, + see HPI Cardiovascular: no CP/no SOB/no palpitations/no leg swelling Respiratory: no cough/no SOB/no wheezing Gastrointestinal: no N/no V/no D/no C/no acid reflux Musculoskeletal: no muscle aches/no joint aches Skin: no rashes, no hair loss Neurological: no tremors/no numbness/no tingling/no dizziness  I reviewed pt's medications, allergies, PMH, social hx, family hx, and changes were documented in the history of present illness. Otherwise,  unchanged from my initial visit note.  Past Medical History:  Diagnosis Date  . Graves disease   . Hyperlipidemia   . Hypertension   . Hyperthyroidism   . PAF (paroxysmal atrial fibrillation) (HCC)    a. Dx 03/2019 -->AF RVR @ [facility]; b. 04/2019 Zio: <1% Afib burden; c. CHA2DS2VASc = 3-->Eliquis.  . Palpitations    a. 05/2017 Echo:  EF 60-65%; b. 05/2017 48h Holter: Rare PACs and PVCs. 11 beat run of SVT w/ aberrancy; c. 04/2019 Zio: Avg HR 56. 37 episodes of SVT, longest 19. Intermittent AF (<1% burden, longest 1:39). Rare PVCs.  Marland Kitchen Positive TB test    Past Surgical History:  Procedure Laterality Date  . CESAREAN SECTION     x 2  . EXCISION METACARPAL MASS Right 12/17/2017   Procedure: EXCISION METACARPAL MASS;  Surgeon: Kennedy Bucker, MD;  Location: [facility] ORS;  Service: Orthopedics;  Laterality: Right;    History   Social History  . Marital Status: Married    Spouse Name: N/A  . Number of Children: 5   Occupational History  . Retired Runner, broadcasting/film/video   Social History Main Topics  . Smoking status: Never Smoker   . Smokeless tobacco: Not on file  . Alcohol Use: No  . Drug Use: No   Current Outpatient Medications on File Prior to Visit  Medication Sig Dispense Refill  . acetaminophen (TYLENOL) 325 MG tablet Take 325 mg by mouth every 6 (six) hours as needed for headache.     Marland Kitchen amLODipine (NORVASC) 5 MG tablet TAKE 1 TABLET(5 MG) BY MOUTH DAILY 90 tablet 1  . Carboxymethylcellul-Glycerin (LUBRICATING EYE DROPS OP) Place 2 drops into both eyes daily as needed (dry eyes).    Marland Kitchen ELIQUIS 5 MG TABS tablet TAKE 1 TABLET(5 MG) BY MOUTH TWICE DAILY 180 tablet 2  . losartan (COZAAR) 100 MG tablet Take 1 tablet (100 mg total) by mouth daily. 90 tablet 3  . metoprolol tartrate (LOPRESSOR) 25 MG tablet TAKE 1/2 TABLET(12.5 MG) BY MOUTH TWICE DAILY 90 tablet 1  . Multiple Vitamin (MULTIVITAMIN WITH MINERALS) TABS tablet Take 1 tablet by mouth daily.    . rosuvastatin (CRESTOR) 5 MG tablet Take 5 mg by mouth at bedtime.     Marland Kitchen spironolactone (ALDACTONE) 25 MG tablet Take 0.5 tablets (12.5 mg total) by mouth daily. 45 tablet 1   No current facility-administered medications on file prior to visit.   No Known Allergies   FH: - HTN in M - HL in M, sister - also see HPI  PE: BP 120/88   Pulse 63   Ht 5\' 3"  (1.6 m)   Wt 163 lb  (73.9 kg)   SpO2 97%   BMI 28.87 kg/m  Body mass index is 28.87 kg/m. Wt Readings from Last 3 Encounters:  09/13/20 163 lb (73.9 kg)  05/15/20 157 lb 8 oz (71.4 kg)  05/10/20 158 lb 9.6 oz (71.9 kg)   Constitutional: normal weight, in NAD Eyes: PERRLA, EOMI, no exophthalmos ENT: moist mucous membranes, no thyromegaly, no cervical lymphadenopathy Cardiovascular: RRR, No MRG Respiratory: CTA B Gastrointestinal: abdomen soft, NT, ND, BS+ Musculoskeletal: no deformities, strength intact in all 4 Skin: moist, warm, no rashes Neurological: no tremor with outstretched hands, DTR normal in all 4  ASSESSMENT: 1. Graves ds.   PLAN:  1. Patient with history of Graves' disease, with improved TFTs on methimazole, which allowed 05/12/20 to decrease the dose to 2.5 mg daily.  At last visit, she was on  this dose, without complaints.  However, after our last visit, TSH was slightly high, at 6.6 so I advised her to stop methimazole (01/2019).  TFTs remained controlled for 1.5 years, but recent TSH was low.  At this visit, we discussed that this could be related to her frequent sinus infections but also recent period of increased stress.  She is not on any steroids or biotin. -Reviewed latest TSH from earlier this month and this was slightly low, at 0.173.  No free thyroid hormones were drawn at that time. -We will recheck her TFTs today, including a TSH, free T4, free T3 -We discussed that if the TSH is only slightly low with normal free T4 and free T3, we can continue to closely follow her without methimazole -At this visit, she has no thyrotoxic symptoms: No hot flashes, palpitations, weight loss, anxiety, insomnia, tremors, hyper defecation. She had high blood pressure (up to 150s systolic) for 5 days possibly related to stress of the Holidays, per her report. -She is on beta-blocker (Lopressor 12.5 mg 2x a day)- her pulse is low today, at 84 -She refused joining my chart, prefers to be called with  results -I will see her in 6 months.   Component     Latest Ref Rng & Units 09/13/2020  TSH     0.35 - 4.50 uIU/mL 0.15 (L)  T4,Free(Direct)     0.60 - 1.60 ng/dL 0.35  Triiodothyronine,Free,Serum     2.3 - 4.2 pg/mL 3.6   TSH is still low, with normal free T4 and free T3.  At this point, I would suggest to start a low-dose methimazole, 2.5 mg daily, and repeat her TFTs in 4 to 5 weeks.  Carlus Pavlov, MD PhD [facility]

## 2020-09-17 NOTE — Telephone Encounter (Signed)
Called and explained to pt an Rx was something that was dicussed as an option after labs results came back.

## 2020-09-27 ENCOUNTER — Encounter: Payer: Self-pay | Admitting: Pulmonary Disease

## 2020-09-27 ENCOUNTER — Other Ambulatory Visit: Payer: Self-pay

## 2020-09-27 ENCOUNTER — Ambulatory Visit: Payer: Medicare PPO | Admitting: Pulmonary Disease

## 2020-09-27 VITALS — BP 124/70 | HR 66 | Temp 97.5°F | Ht 63.0 in | Wt 162.8 lb

## 2020-09-27 DIAGNOSIS — G4736 Sleep related hypoventilation in conditions classified elsewhere: Secondary | ICD-10-CM

## 2020-09-27 DIAGNOSIS — I2729 Other secondary pulmonary hypertension: Secondary | ICD-10-CM

## 2020-09-27 DIAGNOSIS — I272 Pulmonary hypertension, unspecified: Secondary | ICD-10-CM

## 2020-09-27 NOTE — Patient Instructions (Signed)
Continue wearing your oxygen at nighttime.  We will get a follow-up 2D echo, per your request we have stated your preference of a female technician.   We will see him in follow-up in 6 months time call sooner should any new problems arise.

## 2020-09-27 NOTE — Progress Notes (Signed)
Subjective:    Patient ID: Doris [name], female    DOB: 1946/04/11, 74 y.o.   MRN: [id number]  HPI Doris [name] is a 74 year old lifelong never smoker who follows here for the issue of moderate pulmonary hypertension per echo.  She has known issues with diastolic dysfunction, atrial fibrillation and graves disease on Tapazole.  Recently she has had to have her Tapazole restarted.  She also has nocturnal hypoxemia without overt symptoms of sleep apnea.  She has been on supplemental oxygen and notes marked improvement on her sleep quality and alertness during the day.  She does not endorse any shortness of breath.  She has had issues with paroxysmal atrial fibrillation and SVT but these have been controlled.  She sees Dr. Kirke Corin for this.  She has not had any recent fevers, chills or sweats.  No weight fluctuation.  No chest pain.  No orthopnea, paroxysmal nocturnal dyspnea nor lower extremity edema.  She still does not endorse any snoring or witnessed apneic episodes per her husband.  She wakes up refreshed in the morning.  No nocturnal awakenings.  She has been compliant with oxygen at 2 L/min after overnight oximetry showed modest desaturations to 85% (102 events, ODI 11).  Epworth sleepiness scale 3 (low).  Review of Systems A 10 point review of systems was performed and it is as noted above otherwise negative.  Patient Active Problem List   Diagnosis Date Noted  . Perceived hearing changes 09/03/2016  . Graves disease 01/11/2016  . Screening for breast cancer 06/11/2015  . Encounter to establish care 06/11/2015  . 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  . Hypertension    No Known Allergies   Current Meds  Medication Sig  . acetaminophen (TYLENOL) 325 MG tablet Take 325 mg by mouth every 6 (six) hours as needed for headache.   Marland Kitchen amLODipine (NORVASC) 5 MG tablet TAKE 1 TABLET(5 MG) BY MOUTH DAILY  . Carboxymethylcellul-Glycerin (LUBRICATING EYE DROPS OP) Place 2 drops into both eyes daily as needed  (dry eyes).  Marland Kitchen ELIQUIS 5 MG TABS tablet TAKE 1 TABLET(5 MG) BY MOUTH TWICE DAILY  . losartan (COZAAR) 100 MG tablet Take 1 tablet (100 mg total) by mouth daily.  . methimazole (TAPAZOLE) 5 MG tablet Take 0.5 tablets (2.5 mg total) by mouth daily.  . metoprolol tartrate (LOPRESSOR) 25 MG tablet TAKE 1/2 TABLET(12.5 MG) BY MOUTH TWICE DAILY  . Multiple Vitamin (MULTIVITAMIN WITH MINERALS) TABS tablet Take 1 tablet by mouth daily.  Marland Kitchen spironolactone (ALDACTONE) 25 MG tablet Take 0.5 tablets (12.5 mg total) by mouth daily.   Immunization History  Administered Date(s) Administered  . Influenza Inj Mdck Quad Pf 05/16/2020  . Influenza, Seasonal, Injecte, Preservative Fre 08/06/2011, 06/24/2012  . Influenza,inj,Quad PF,6+ Mos 06/28/2014, 06/07/2015, 06/10/2016, 05/21/2017, 06/07/2018, 05/05/2019  . Moderna Sars-Covid-2 Vaccination 09/24/2019, 10/22/2019, 06/18/2020  . Pneumococcal Conjugate-13 05/15/2015  . Pneumococcal Polysaccharide-23 01/26/2014  . Pneumococcal-Unspecified 05/15/2015  . Td 10/02/2010  . Tdap 10/02/2010       Objective:   Physical Exam BP 124/70 (BP Location: Left Arm, Cuff Size: Normal)   Pulse 66   Temp (!) 97.5 F (36.4 C) (Temporal)   Ht 5\' 3"  (1.6 m)   Wt 162 lb 12.8 oz (73.8 kg)   SpO2 100%   BMI 28.84 kg/m  GENERAL: Well-developed, slightly overweight woman in no acute distress.  Fully ambulatory.  No conversational dyspnea. HEAD: Normocephalic, atraumatic.  EYES: Pupils equal, round, reactive to light.  No scleral icterus.  MOUTH:  Nose/mouth/throat not examined due to masking requirements for COVID 19. NECK: Supple. No thyromegaly. Trachea midline. No JVD.  No adenopathy. PULMONARY: Good air entry bilaterally.  No adventitious sounds. CARDIOVASCULAR: S1 and S2. Regular rate and rhythm.  No rubs, murmurs or gallops heard. ABDOMEN: Benign. MUSCULOSKELETAL: No joint deformity, no clubbing, no edema.  NEUROLOGIC: No focal deficits, no gait disturbance, speech  is fluent.  No tremor. SKIN: Intact,warm,dry.  On limited exam no rashes PSYCH: Mood and behavior normal.       Assessment & Plan:     ICD-10-CM   1. Pulmonary hypertension (HCC)  I27.20 ECHOCARDIOGRAM COMPLETE   Etiology likely multifactorial: Diastolic dysfunction Recheck 2D echo Graves' disease  2. 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 ECHOCARDIOGRAM COMPLETE    Standing Status:   Future    Standing Expiration Date:   03/27/2021    Scheduling Instructions:     Patient prefers female tech.    Order Specific Question:   Where should this test be performed    Answer:   [facility]    Order Specific Question:   Please indicate who you request to read the nuc med / echo results.    Answer:   [facility]    Order Specific Question:   Perflutren DEFINITY (image enhancing agent) should be administered unless hypersensitivity or allergy exist    Answer:   Administer Perflutren    Order Specific Question:   Reason for exam-Echo    Answer:   Pulmonary hypertension  416.8 / I27.2   Discussion:  Patient is a 75 year old lifelong never smoker with moderate pulmonary hypertension per echo of 2020 and issues with diastolic dysfunction, paroxysmal atrial fibrillation and Graves' disease.  Recently required restarting Tapazole.  She has nocturnal hypoxemia likely due to her pulmonary hypertension.  No indication of sleep apnea by clinical impression.  She has been on supplemental oxygen at nighttime and has been compliant.  Notes improvement in symptoms of daytime tiredness.  Etiology of her pulmonary hypertension is likely multifactorial and due to Graves' disease (high incidence of mild to moderate pulmonary hypertension in this population) and diastolic dysfunction.  She is to  continue nocturnal oxygen supplementation.  We will recheck 2D echo to reevaluate the degree of her pulmonary hypertension.  We will see her in follow-up in the call sooner should any new difficulties arise.  Gailen Shelter, MD [facility]   *This note was dictated using voice recognition software/Dragon.  Despite best efforts to proofread, errors can occur which can change the meaning.  Any change was purely unintentional.

## 2020-09-28 ENCOUNTER — Encounter: Payer: Self-pay | Admitting: Pulmonary Disease

## 2020-10-09 ENCOUNTER — Ambulatory Visit
Admission: RE | Admit: 2020-10-09 | Discharge: 2020-10-09 | Disposition: A | Discharge status: Home / Self Care | Payer: Medicare PPO | Source: Ambulatory Visit | Attending: Pulmonary Disease | Admitting: Pulmonary Disease

## 2020-10-09 ENCOUNTER — Other Ambulatory Visit: Payer: Self-pay

## 2020-10-09 DIAGNOSIS — I272 Pulmonary hypertension, unspecified: Secondary | ICD-10-CM | POA: Diagnosis present

## 2020-10-09 DIAGNOSIS — E785 Hyperlipidemia, unspecified: Secondary | ICD-10-CM | POA: Diagnosis not present

## 2020-10-09 DIAGNOSIS — I10 Essential (primary) hypertension: Secondary | ICD-10-CM | POA: Insufficient documentation

## 2020-10-09 DIAGNOSIS — I340 Nonrheumatic mitral (valve) insufficiency: Secondary | ICD-10-CM | POA: Diagnosis not present

## 2020-10-09 LAB — ECHOCARDIOGRAM COMPLETE
AR max vel: 1.97 cm2
AV Area VTI: 1.93 cm2
AV Area mean vel: 1.93 cm2
AV Mean grad: 4 mmHg
AV Peak grad: 7 mmHg
Ao pk vel: 1.32 m/s
Area-P 1/2: 3.23 cm2
MV VTI: 1.72 cm2
S' Lateral: 2.3 cm

## 2020-10-09 NOTE — Progress Notes (Signed)
*  PRELIMINARY RESULTS* Echocardiogram 2D Echocardiogram has been performed.  Doris [name] 10/09/2020, 11:48 AM

## 2020-10-22 ENCOUNTER — Other Ambulatory Visit (INDEPENDENT_AMBULATORY_CARE_PROVIDER_SITE_OTHER): Payer: Medicare PPO

## 2020-10-22 ENCOUNTER — Other Ambulatory Visit: Payer: Self-pay

## 2020-10-22 DIAGNOSIS — E0500 Thyrotoxicosis with diffuse goiter without thyrotoxic crisis or storm: Secondary | ICD-10-CM

## 2020-10-22 LAB — T4, FREE: Free T4: 0.71 ng/dL (ref 0.60–1.60)

## 2020-10-22 LAB — T3, FREE: T3, Free: 3.3 pg/mL (ref 2.3–4.2)

## 2020-10-22 LAB — TSH: TSH: 1.49 u[IU]/mL (ref 0.35–4.50)

## 2020-10-24 ENCOUNTER — Telehealth: Payer: Self-pay | Admitting: Internal Medicine

## 2020-10-24 NOTE — Telephone Encounter (Signed)
Pt requests a call regarding lab results. Ph# [phone]

## 2020-10-24 NOTE — Telephone Encounter (Signed)
Called and advised pt of results

## 2020-11-20 LAB — COLOGUARD: COLOGUARD: NEGATIVE

## 2020-11-22 ENCOUNTER — Encounter: Payer: Self-pay | Admitting: Cardiovascular Disease

## 2020-11-22 ENCOUNTER — Other Ambulatory Visit: Payer: Self-pay

## 2020-11-22 ENCOUNTER — Ambulatory Visit: Payer: Medicare PPO | Admitting: Cardiovascular Disease

## 2020-11-22 VITALS — BP 140/70 | HR 66 | Ht 63.0 in | Wt 162.0 lb

## 2020-11-22 DIAGNOSIS — E785 Hyperlipidemia, unspecified: Secondary | ICD-10-CM | POA: Diagnosis not present

## 2020-11-22 DIAGNOSIS — I480 Paroxysmal atrial fibrillation: Secondary | ICD-10-CM

## 2020-11-22 DIAGNOSIS — I10 Essential (primary) hypertension: Secondary | ICD-10-CM

## 2020-11-22 NOTE — Progress Notes (Signed)
Cardiology Office Note   Date:  11/22/2020   ID:  Merrilyn, Legler 1946-01-18, MRN [id number]  PCP:  Tacey Ruiz, FNP  Cardiologist:   Lorine Bears, MD   Chief Complaint  Patient presents with  . Follow-up    6 month F/U      History of Present Illness: Doris [name] is a 75 y.o. female who presents for  a followup visit regarding paroxysmal atrial fibrillation and hypertension.  She has known history of essential hypertension, hyperlipidemia and hypothyroidism. She was diagnosed with atrial fibrillation in August 2020 after she presented to [facility] ED with palpitations.  Her heart rate was 169 bpm.  She was discharged on small dose metoprolol and Eliquis and has done well since then.   Echocardiogram in September 2020 showed an EF of 60 to 65% with mild pulmonary hypertension.  She had hyponatremia with hydrochlorothiazide and was subsequently switched to spironolactone. She had a repeat echocardiogram done in February of this year which showed normal LV systolic function, normal diastolic function and improvement in pulmonary pressure with estimated systolic pulmonary pressure of 36 mmHg.  She has been doing well with no recent chest pain or dyspnea.  She has very brief palpitations mostly in the morning that do not last more than 1 minute.  Past Medical History:  Diagnosis Date  . Graves disease   . Hyperlipidemia   . Hypertension   . Hyperthyroidism   . PAF (paroxysmal atrial fibrillation) (HCC)    a. Dx 03/2019 -->AF RVR @ [facility]; b. 04/2019 Zio: <1% Afib burden; c. CHA2DS2VASc = 3-->Eliquis.  . Palpitations    a. 05/2017 Echo: EF 60-65%; b. 05/2017 48h Holter: Rare PACs and PVCs. 11 beat run of SVT w/ aberrancy; c. 04/2019 Zio: Avg HR 56. 37 episodes of SVT, longest 19. Intermittent AF (<1% burden, longest 1:39). Rare PVCs.  Marland Kitchen Positive TB test     Past Surgical History:  Procedure Laterality Date  . CESAREAN SECTION     x 2  . EXCISION METACARPAL MASS Right  12/17/2017   Procedure: EXCISION METACARPAL MASS;  Surgeon: Kennedy Bucker, MD;  Location: [facility] ORS;  Service: Orthopedics;  Laterality: Right;     Current Outpatient Medications  Medication Sig Dispense Refill  . acetaminophen (TYLENOL) 325 MG tablet Take 325 mg by mouth every 6 (six) hours as needed for headache.     Marland Kitchen amLODipine (NORVASC) 5 MG tablet TAKE 1 TABLET(5 MG) BY MOUTH DAILY 90 tablet 1  . Carboxymethylcellul-Glycerin (LUBRICATING EYE DROPS OP) Place 2 drops into both eyes daily as needed (dry eyes).    Marland Kitchen ELIQUIS 5 MG TABS tablet TAKE 1 TABLET(5 MG) BY MOUTH TWICE DAILY 180 tablet 2  . losartan (COZAAR) 100 MG tablet Take 1 tablet (100 mg total) by mouth daily. 90 tablet 3  . methimazole (TAPAZOLE) 5 MG tablet Take 0.5 tablets (2.5 mg total) by mouth daily. 30 tablet 5  . metoprolol tartrate (LOPRESSOR) 25 MG tablet TAKE 1/2 TABLET(12.5 MG) BY MOUTH TWICE DAILY 90 tablet 1  . Multiple Vitamin (MULTIVITAMIN WITH MINERALS) TABS tablet Take 1 tablet by mouth daily.    . rosuvastatin (CRESTOR) 5 MG tablet Take 5 mg by mouth at bedtime.     Marland Kitchen spironolactone (ALDACTONE) 25 MG tablet Take 0.5 tablets (12.5 mg total) by mouth daily. 45 tablet 1   No current facility-administered medications for this visit.    Allergies:   Patient has no known allergies.  Social History:  The patient  reports that she has never smoked. She has never used smokeless tobacco. She reports that she does not drink alcohol and does not use drugs.   Family History:  The patient's family history includes Cancer in her father and mother; Thyroid cancer in her daughter.    ROS:  Please see the history of present illness.   Otherwise, review of systems are positive for none.   All other systems are reviewed and negative.    PHYSICAL EXAM: VS:  BP 140/70 (BP Location: Left Arm, Patient Position: Sitting, Cuff Size: Large)   Pulse 66   Ht 5\' 3"  (1.6 m)   Wt 162 lb (73.5 kg)   SpO2 95%   BMI 28.70 kg/m  ,  BMI Body mass index is 28.7 kg/m. GEN: Well nourished, well developed, in no acute distress  HEENT: normal  Neck: no JVD, carotid bruits, or masses Cardiac: RRR; no  rubs, or gallops,no edema . 1/ 6 systolic ejection murmur in the pulmonic area which is early peaking. Respiratory:  clear to auscultation bilaterally, normal work of breathing GI: soft, nontender, nondistended, + BS MS: no deformity or atrophy  Skin: warm and dry, no rash Neuro:  Strength and sensation are intact Psych: euthymic mood, full affect   EKG:  EKG is  ordered today. EKG showed normal sinus rhythm with no significant ST or T wave changes.   Recent Labs: 01/24/2020: Hemoglobin 13.0; Platelets 251 02/20/2020: BUN 22; Creatinine, Ser 1.00; Potassium 4.6; Sodium 136 10/22/2020: TSH 1.49    Lipid Panel    Component Value Date/Time   LDLDIRECT 74.0 06/07/2015 1057      Wt Readings from Last 3 Encounters:  11/22/20 162 lb (73.5 kg)  09/27/20 162 lb 12.8 oz (73.8 kg)  09/13/20 163 lb (73.9 kg)      ASSESSMENT AND PLAN:  1.  Paroxysmal atrial fibrillation: Doing well overall on metoprolol 12.5 mg twice daily.  She is tolerating anticoagulation with no side effects.  Given overall low burden, no indication for ablation or antiarrhythmic medication.  I reviewed her labs and which showed normal CBC and stable basic metabolic profile with creatinine of 1.1 and normal electrolytes.  2.  Essential hypertension: Blood pressure is now well controlled after adjusting her medications with good tolerance so far.  3.  Hyperlipidemia: On rosuvastatin.  I reviewed lipid profile done in January which showed a triglyceride of 143 and an LDL of 42.    Disposition:   FU with me in 6 months  Signed,  February, MD  11/22/2020 3:54 PM    [facility]

## 2020-11-22 NOTE — Patient Instructions (Signed)

## 2020-12-11 ENCOUNTER — Other Ambulatory Visit: Payer: Self-pay | Admitting: Cardiovascular Disease

## 2021-01-08 ENCOUNTER — Other Ambulatory Visit: Payer: Self-pay | Admitting: Cardiovascular Disease

## 2021-01-08 ENCOUNTER — Telehealth: Payer: Self-pay | Admitting: Cardiovascular Disease

## 2021-01-08 NOTE — Telephone Encounter (Signed)
Patient calling in after receiving second booster yesterday. Patient is complaining of increased BP and HR  Pt c/o BP issue: STAT if pt c/o blurred vision, one-sided weakness or slurred speech  1. What are your last 5 BP readings?  7:35 am 160/90 85  2. Are you having any other symptoms (ex. Dizziness, headache, blurred vision, passed out)? Unable to sleep last night   3. What is your BP issue? Increased since covid booster yesterday

## 2021-01-08 NOTE — Telephone Encounter (Signed)
Patient sts that she had her COVID booster yesterday around 1pm. Patient sts that she did not sleep well last night due to body chills and palpitations. She reports feeling better this morning, but a little tired from the lack of sleep.  She did take he medications this morning around 6:30am. Her BP reading provided 160/90 85 bpm was taken an hour after her meds.  I asked the patient to recheck her BP while a held the line. Patient reports BP 144/82 79bpm.  Adv the patient to get some rest today and to stay hydrated. She is to contact the office if symptoms develop or if her BP/HR is consistently elevated.  Patient id agreeable with the plan and voiced appreciation for the call back.

## 2021-03-14 ENCOUNTER — Other Ambulatory Visit (INDEPENDENT_AMBULATORY_CARE_PROVIDER_SITE_OTHER): Payer: Medicare PPO

## 2021-03-14 ENCOUNTER — Other Ambulatory Visit: Payer: Self-pay

## 2021-03-14 ENCOUNTER — Encounter: Payer: Self-pay | Admitting: Internal Medicine

## 2021-03-14 ENCOUNTER — Ambulatory Visit: Payer: Medicare PPO | Admitting: Internal Medicine

## 2021-03-14 VITALS — BP 120/80 | HR 59 | Ht 63.0 in | Wt 157.4 lb

## 2021-03-14 DIAGNOSIS — E0500 Thyrotoxicosis with diffuse goiter without thyrotoxic crisis or storm: Secondary | ICD-10-CM | POA: Diagnosis not present

## 2021-03-14 LAB — T4, FREE: Free T4: 0.79 ng/dL (ref 0.60–1.60)

## 2021-03-14 LAB — TSH: TSH: 3.53 u[IU]/mL (ref 0.35–5.50)

## 2021-03-14 LAB — T3, FREE: T3, Free: 3.1 pg/mL (ref 2.3–4.2)

## 2021-03-14 NOTE — Progress Notes (Signed)
Patient ID: Doris [name], female   DOB: [birth year], 75 y.o.   MRN: [id number]  This visit occurred during the SARS-CoV-2 public health emergency.  Safety protocols were in place, including screening questions prior to the visit, additional usage of staff PPE, and extensive cleaning of exam room while observing appropriate contact time as indicated for disinfecting solutions.   HPI  Doris [name] is a 75 y.o.-year-old very pleasant female, initially referred by her PCP, Tacey Ruiz, NP (Dr Barron Alvine [facility]), presenting for follow-up for Graves ds. Last visit 6 months ago.  Interim history: At last visit, patient returns after period of increased stress with her husband being sick, her son's wedding, and the holidays.  A TSH was low before last visit and it was again low at the time of the visit. At today's visit, she denies tremors, palpitations, unintentional weight loss (lost 5 pounds intentionally), heat intolerance.  She has no complaints. She is preparing to go in a trip to [place].  Reviewed and addended history: She had a gastroenteritis on 10/25/2014 >> felt weak after this. She presented to [facility] for the weakness - TSH found low mid-March. She then had the TSH rechecked at a visit with PCP: TSH still low.   She had weakness in the past with her thyrotoxic episodes, including winter 2016 >> TFTs abnormal >> restarted MMI 5 mg daily in 10/2015 >> we were able to decrease the dose to 2.5 mg daily (5 mg qod) in 12/2015.   12/2017: TFTs were normal on 2.5 mg of methimazole daily.  01/2019: We stopped methimazole after a TSH returned slightly elevated  TFTs were normal afterwards until 08/2020. She has chronic sinusitis with frequent exacerbations.   A TSH was low x2 in 08/2020.  We started back on methimazole 2.5 mg daily (she takes 5 mg every other day)  Reviewed patient's TFTs: Lab Results  Component Value Date   TSH 1.49 10/22/2020   TSH 0.15 (L) 09/13/2020    TSH 0.71 11/24/2019   TSH 1.78 03/09/2019   TSH 2.68 01/11/2018   FREET4 0.71 10/22/2020   FREET4 1.03 09/13/2020   FREET4 0.94 11/24/2019   FREET4 0.72 03/09/2019   FREET4 0.84 01/11/2018  09/07/2020: TSH 0.173 (0.6-3.3) 04/06/2019: TSH 2.14, free T4 1.18, free T3 2.95, all normal 02/10/2019: TSH 6.6 11/21/2014: TSH 0.229 (0.450-4.5), TT4 11.9 (4.5-12)  11/04/2014: TSH 0.326 02/2014: TSH 2.530  Her TSI level was normal at last check: Lab Results  Component Value Date   TSI 108 01/11/2018    05/29/2015: Thyroid uptake and scan: The 24 hour radioactive iodine uptake is equal to 40.9%. There is uniform tracer uptake throughout both lobes. No dominant hot or cold nodule identified. IMPRESSION: 1. Elevated 24 hour radioactive uptake equal 40.9%. 2. Patient's hyperthyroidism may be due to Graves disease.  She saw Dr. Kirke Corin in the past for palpitations.  She was diagnosed with atrial fibrillation.  On Eliquis and Lopressor.  Pt denies: - feeling nodules in neck - hoarseness - dysphagia - choking - SOB with lying down  + Family history of thyroid cancer in daughter. No h/o radiation tx to head or neck.  No recent contrast studies. No herbal supplements. No Biotin use. No recent steroids use.   She has 2 daughters (one in [place] and one near [place]).  ROS: Constitutional: no weight gain/no weight loss, no fatigue, no subjective hyperthermia, no subjective hypothermia Eyes: no blurry vision, no xerophthalmia ENT: no sore throat, +  see HPI Cardiovascular: no CP/no SOB/no palpitations/no leg swelling Respiratory: no cough/no SOB/no wheezing Gastrointestinal: no N/no V/no D/no C/no acid reflux Musculoskeletal: no muscle aches/no joint aches Skin: no rashes, no hair loss Neurological: no tremors/no numbness/no tingling/no dizziness  I reviewed pt's medications, allergies, PMH, social hx, family hx, and changes were documented in the history of present illness. Otherwise,  unchanged from my initial visit note.  Past Medical History:  Diagnosis Date   Graves disease    Hyperlipidemia    Hypertension    Hyperthyroidism    PAF (paroxysmal atrial fibrillation) (HCC)    a. Dx 03/2019 -->AF RVR @ [facility]; b. 04/2019 Zio: <1% Afib burden; c. CHA2DS2VASc = 3-->Eliquis.   Palpitations    a. 05/2017 Echo: EF 60-65%; b. 05/2017 48h Holter: Rare PACs and PVCs. 11 beat run of SVT w/ aberrancy; c. 04/2019 Zio: Avg HR 56. 37 episodes of SVT, longest 19. Intermittent AF (<1% burden, longest 1:39). Rare PVCs.   Positive TB test    Past Surgical History:  Procedure Laterality Date   CESAREAN SECTION     x 2   EXCISION METACARPAL MASS Right 12/17/2017   Procedure: EXCISION METACARPAL MASS;  Surgeon: Kennedy Bucker, MD;  Location: [facility] ORS;  Service: Orthopedics;  Laterality: Right;    History   Social History   Marital Status: Married    Spouse Name: N/A   Number of Children: 5   Occupational History   Retired Runner, broadcasting/film/video   Social History Main Topics   Smoking status: Never Smoker    Smokeless tobacco: Not on file   Alcohol Use: No   Drug Use: No   Current Outpatient Medications on File Prior to Visit  Medication Sig Dispense Refill   acetaminophen (TYLENOL) 325 MG tablet Take 325 mg by mouth every 6 (six) hours as needed for headache.      amLODipine (NORVASC) 5 MG tablet TAKE 1 TABLET(5 MG) BY MOUTH DAILY 90 tablet 2   Carboxymethylcellul-Glycerin (LUBRICATING EYE DROPS OP) Place 2 drops into both eyes daily as needed (dry eyes).     ELIQUIS 5 MG TABS tablet TAKE 1 TABLET(5 MG) BY MOUTH TWICE DAILY 180 tablet 2   losartan (COZAAR) 100 MG tablet Take 1 tablet (100 mg total) by mouth daily. 90 tablet 3   methimazole (TAPAZOLE) 5 MG tablet Take 0.5 tablets (2.5 mg total) by mouth daily. 30 tablet 5   metoprolol tartrate (LOPRESSOR) 25 MG tablet TAKE 1/2 TABLET(12.5 MG) BY MOUTH TWICE DAILY 90 tablet 1   Multiple Vitamin (MULTIVITAMIN WITH MINERALS) TABS tablet Take 1  tablet by mouth daily.     rosuvastatin (CRESTOR) 5 MG tablet Take 5 mg by mouth at bedtime.      spironolactone (ALDACTONE) 25 MG tablet TAKE 1/2 TABLET(12.5 MG) BY MOUTH DAILY 45 tablet 0   No current facility-administered medications on file prior to visit.   No Known Allergies   FH: - HTN in M - HL in M, sister - also see HPI  PE: BP 120/80 (BP Location: Right Arm, Patient Position: Sitting, Cuff Size: Normal)   Pulse (!) 59   Ht 5\' 3"  (1.6 m)   Wt 157 lb 6.4 oz (71.4 kg)   SpO2 99%   BMI 27.88 kg/m  Body mass index is 27.88 kg/m. Wt Readings from Last 3 Encounters:  03/14/21 157 lb 6.4 oz (71.4 kg)  11/22/20 162 lb (73.5 kg)  09/27/20 162 lb 12.8 oz (73.8 kg)   Constitutional: normal weight,  in NAD Eyes: PERRLA, EOMI, no exophthalmos ENT: moist mucous membranes, no thyromegaly, no cervical lymphadenopathy Cardiovascular: RRR, No MRG Respiratory: CTA B Gastrointestinal: abdomen soft, NT, ND, BS+ Musculoskeletal: no deformities, strength intact in all 4 Skin: moist, warm, no rashes Neurological: no tremor with outstretched hands, DTR normal in all 4  ASSESSMENT: 1. Graves ds.   PLAN:  1.  -Patient with history of Graves' disease with improved TFTs on methimazole, which we were able to decrease and then completely stopping 01/2019.  TFTs remained controlled for 1.5 years, but right before our last visit, the TSH returned low.  She was not on biotin on steroids.  We repeated it and the TSH was still lower than the lower limit of normal while she had normal free thyroid hormones. -At last visit, we started the low-dose methimazole, 2.5 mg daily.  On this dose, her TFTs normalized 09/2020.  She continues on this dose.  She tolerates methimazole well. -At this visit, she denies hot flashes, palpitations, weight loss, anxiety, insomnia, tremors, hyper defecation. She has mild intentional weight loss >> low fat, for gallbladder stones -She continues on the  beta-blocker-Lopressor 12.5 mg twice a day.  No tachycardia. -We will recheck a TSH, free T4, free T3 today -She would prefer to be called with results, rather than joining MyChart -I will see her in 6 months  Component     Latest Ref Rng & Units 03/14/2021  T4,Free(Direct)     0.60 - 1.60 ng/dL 7.94  Triiodothyronine,Free,Serum     2.3 - 4.2 pg/mL 3.1  TSH     0.35 - 5.50 uIU/mL 3.53  TFTs normal. Will continue the same MMI dose and recheck TFTs at next OV.  Carlus Pavlov, MD PhD [facility] Endocrinology

## 2021-03-14 NOTE — Patient Instructions (Signed)
Please continue methimazole 2.5 mg daily.  Please stop at the lab.  Please come back for a follow-up appointment in 6 months. 

## 2021-03-28 ENCOUNTER — Telehealth: Payer: Self-pay | Admitting: Cardiovascular Disease

## 2021-03-28 NOTE — Telephone Encounter (Signed)
*  STAT* If patient is at the pharmacy, call can be transferred to refill team.   1. Which medications need to be refilled? (please list name of each medication and dose if known)   Metoprolol 12.5 mg po BID   2. Which pharmacy/location (including street and city if local pharmacy) is medication to be sent to? [facility]   3. Do they need a 30 day or 90 day supply? 90

## 2021-03-29 MED ORDER — METOPROLOL TARTRATE 25 MG PO TABS: ORAL_TABLET | ORAL | 0 refills | Status: DC

## 2021-03-29 NOTE — Telephone Encounter (Signed)
Contact [facility] in [place] to disregard refill sent in wrong pharmacy.

## 2021-03-29 NOTE — Addendum Note (Signed)
Addended by: Kendrick Fries on: 03/29/2021 08:38 AM   Modules accepted: Orders

## 2021-03-29 NOTE — Telephone Encounter (Signed)
Requested Prescriptions   Signed Prescriptions Disp Refills   metoprolol tartrate (LOPRESSOR) 25 MG tablet 90 tablet 0    Sig: TAKE 1/2 TABLET(12.5 MG) BY MOUTH TWICE DAILY    Authorizing Provider: Lorine Bears A    Ordering User: Kendrick Fries

## 2021-03-29 NOTE — Telephone Encounter (Signed)
[facility] has been informed.

## 2021-04-04 ENCOUNTER — Telehealth: Payer: Self-pay | Admitting: Cardiovascular Disease

## 2021-04-04 MED ORDER — SPIRONOLACTONE 25 MG PO TABS: ORAL_TABLET | ORAL | 0 refills | Status: DC

## 2021-04-04 NOTE — Telephone Encounter (Signed)
*  STAT* If patient is at the pharmacy, call can be transferred to refill team.   1. Which medications need to be refilled? (please list name of each medication and dose if known) spironolactone 25 MG 1/2 tablet daily   2. Which pharmacy/location (including street and city if local pharmacy) [facility] [place]   3. Do they need a 30 day or 90 day supply? 90 day

## 2021-04-04 NOTE — Telephone Encounter (Signed)
Requested Prescriptions   Signed Prescriptions Disp Refills   spironolactone (ALDACTONE) 25 MG tablet 45 tablet 0    Sig: TAKE 1/2 TABLET(12.5 MG) BY MOUTH DAILY    Authorizing Provider: Lorine Bears A    Ordering User: Thayer Headings, Ladavion Savitz L

## 2021-05-22 IMAGING — CR DG CHEST 2V
1 series · 2 of 2 positions shown · non-contrast
Comparison: 04/08/2019

CLINICAL DATA: Cardiac palpitations

EXAM:
CHEST - 2 VIEW

[Series 1: dg chest 2 view · 0.14mm/px · 2 of 2 slices shown]
[im 1/2]
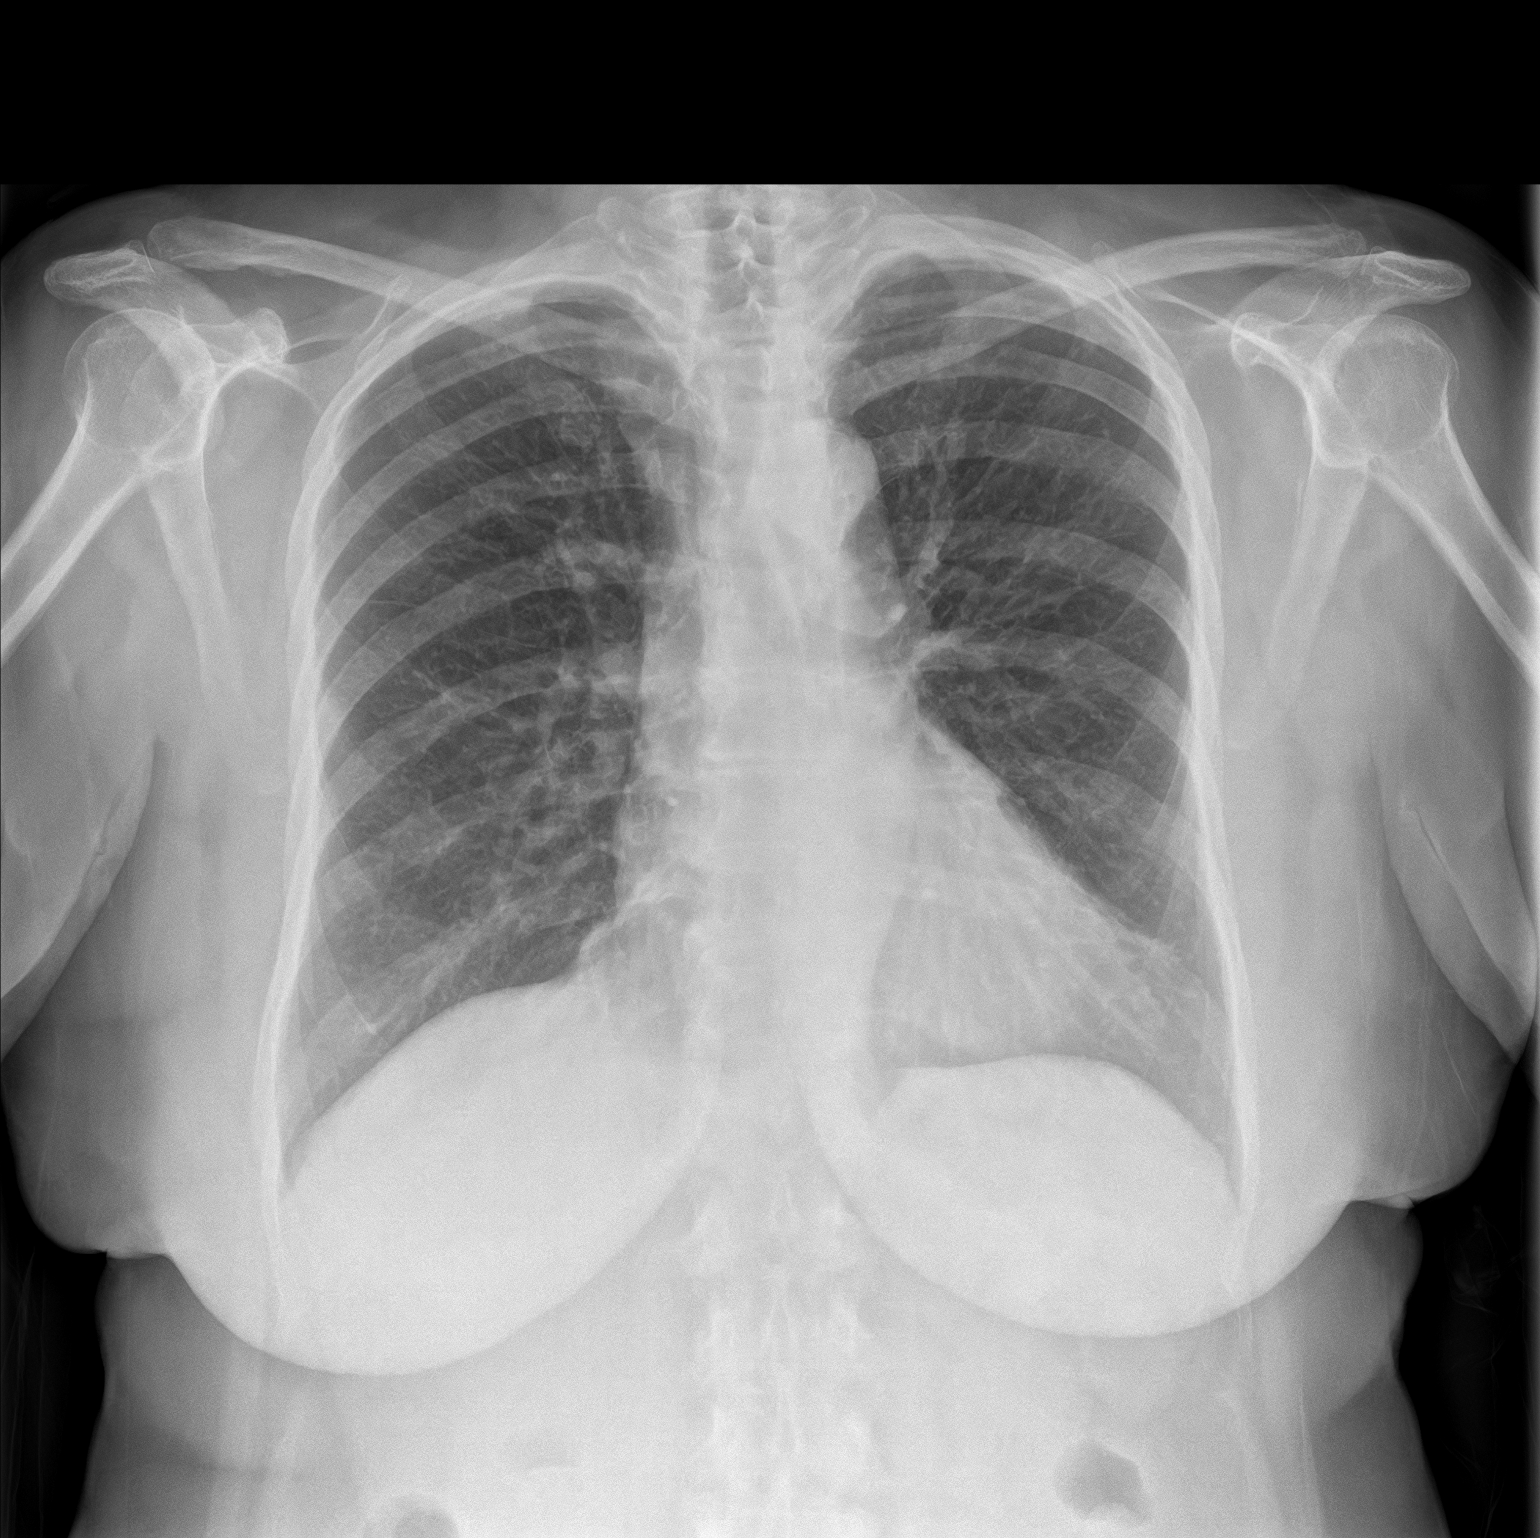
[im 2/2]
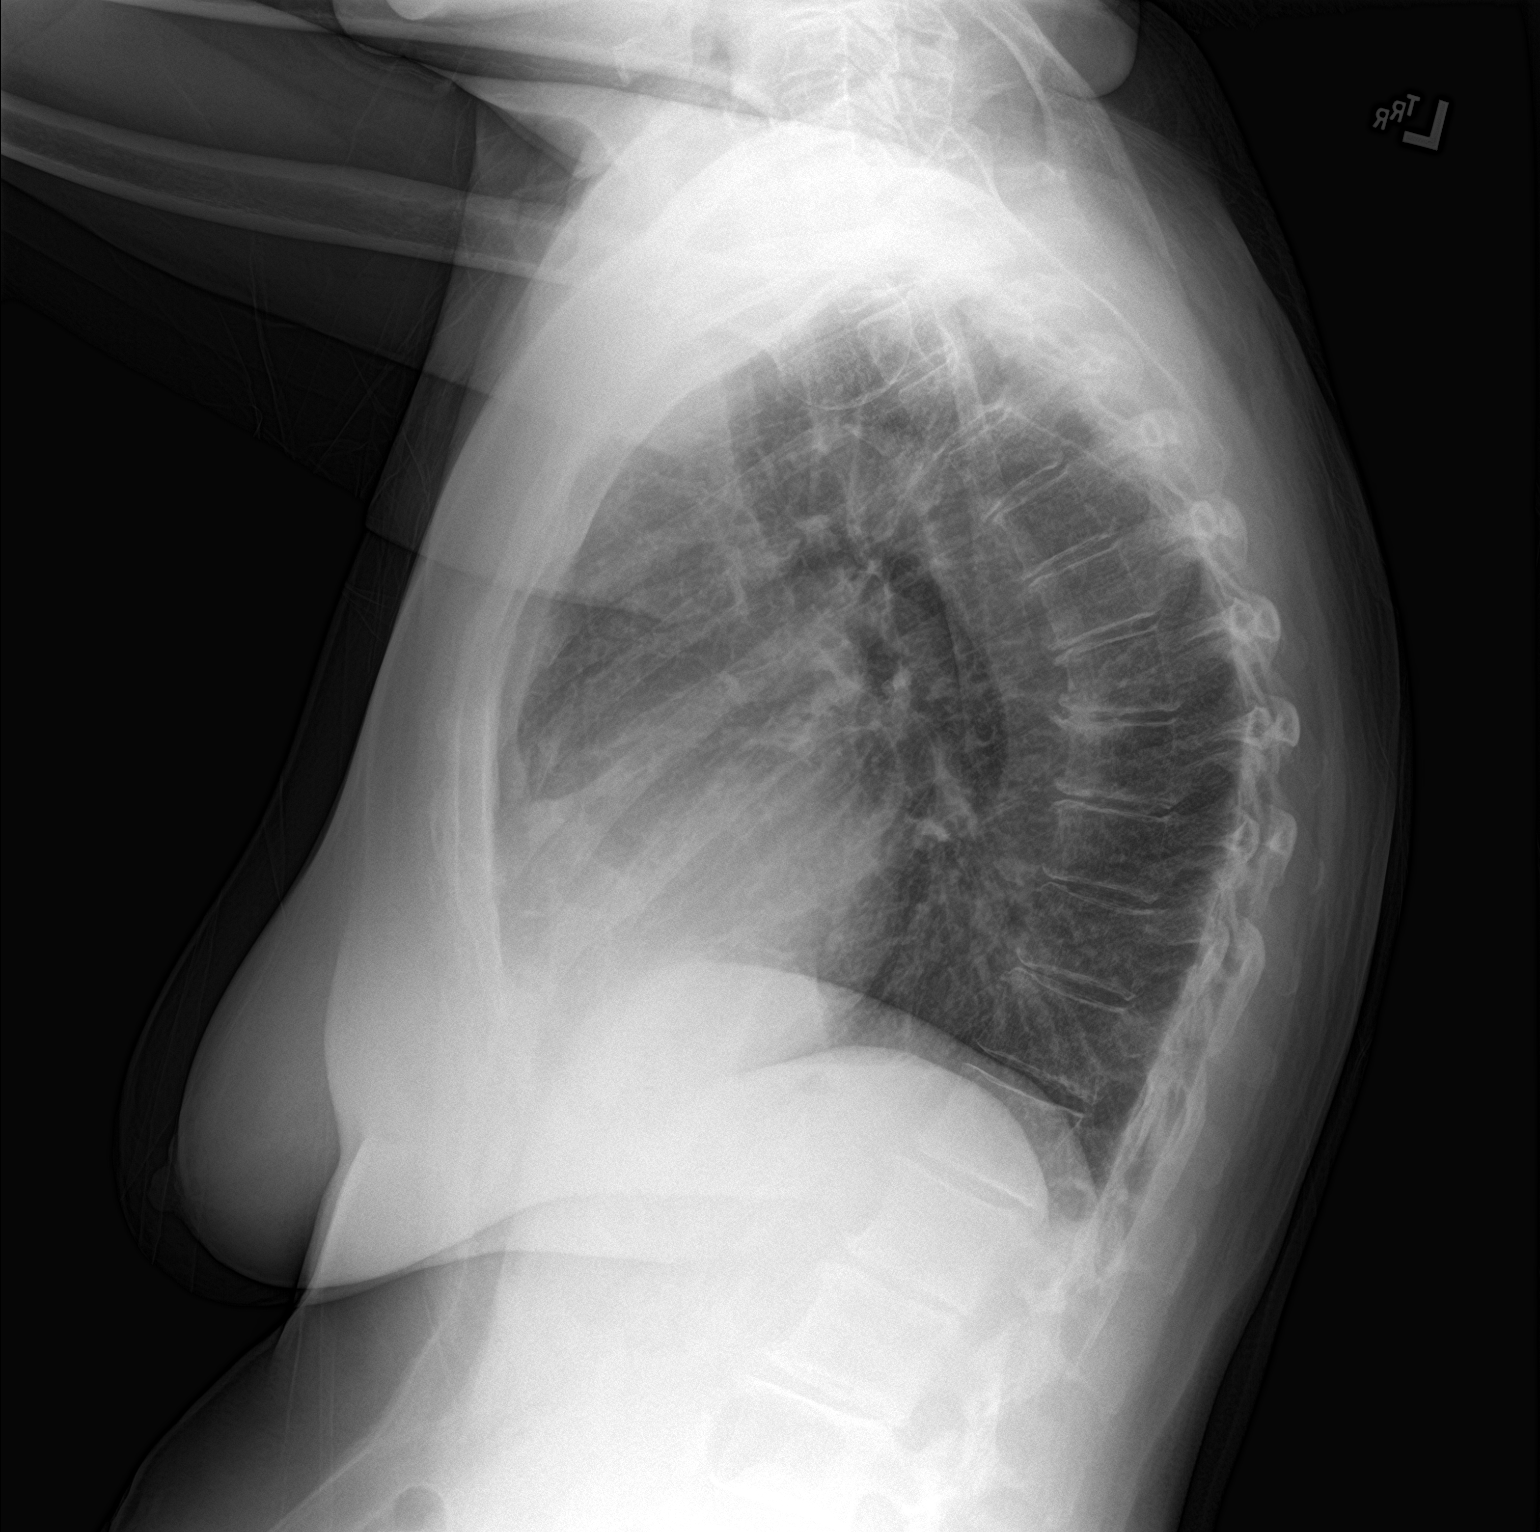

[2 of 2 positions shown; findings below may reference images not displayed]

FINDINGS: Cardiac shadow is within normal limits. The lungs are well aerated
bilaterally. Mild lingular infiltrate is noted best seen on the
lateral projection. No bony abnormality is seen.
IMPRESSION: Mild lingular infiltrate.

## 2021-05-23 ENCOUNTER — Other Ambulatory Visit: Payer: Self-pay

## 2021-05-23 ENCOUNTER — Ambulatory Visit: Payer: Medicare PPO | Admitting: Cardiovascular Disease

## 2021-05-23 ENCOUNTER — Encounter: Payer: Self-pay | Admitting: Cardiovascular Disease

## 2021-05-23 VITALS — BP 132/70 | HR 66 | Ht 63.0 in | Wt 160.0 lb

## 2021-05-23 DIAGNOSIS — E785 Hyperlipidemia, unspecified: Secondary | ICD-10-CM | POA: Diagnosis not present

## 2021-05-23 DIAGNOSIS — I10 Essential (primary) hypertension: Secondary | ICD-10-CM | POA: Diagnosis not present

## 2021-05-23 DIAGNOSIS — I480 Paroxysmal atrial fibrillation: Secondary | ICD-10-CM | POA: Diagnosis not present

## 2021-05-23 NOTE — Progress Notes (Signed)
Cardiology Office Note   Date:  05/23/2021   ID:  Doris [name], Doris [name] 12/30/1945, MRN [id number]  PCP:  Tacey Ruiz, FNP  Cardiologist:   Lorine Bears, MD   Chief Complaint  Patient presents with   Other    6 month f/u no complaints today. Meds reviewed verbally with pt.      History of Present Illness: Doris [name] is a 75 y.o. female who presents for  a followup visit regarding paroxysmal atrial fibrillation and hypertension.  She has known history of essential hypertension, hyperlipidemia and hypothyroidism. She was diagnosed with atrial fibrillation in August 2020 after she presented to [facility] ED with palpitations.  Her heart rate was 169 bpm.  She was discharged on small dose metoprolol and Eliquis and has done well since then.   Echocardiogram in September 2020 showed an EF of 60 to 65% with mild pulmonary hypertension.  She had hyponatremia with hydrochlorothiazide and was subsequently switched to spironolactone. She had a repeat echocardiogram done in February of 2022 which showed normal LV systolic function, normal diastolic function and improvement in pulmonary pressure with estimated systolic pulmonary pressure of 36 mmHg.  She has been doing well overall with no chest pain or shortness of breath.  She reports brief palpitations lasting less than 1 minute but no other associated symptoms.  No side effects with medications or anticoagulation.  Past Medical History:  Diagnosis Date   Graves disease    Hyperlipidemia    Hypertension    Hyperthyroidism    PAF (paroxysmal atrial fibrillation) (HCC)    a. Dx 03/2019 -->AF RVR @ [facility]; b. 04/2019 Zio: <1% Afib burden; c. CHA2DS2VASc = 3-->Eliquis.   Palpitations    a. 05/2017 Echo: EF 60-65%; b. 05/2017 48h Holter: Rare PACs and PVCs. 11 beat run of SVT w/ aberrancy; c. 04/2019 Zio: Avg HR 56. 37 episodes of SVT, longest 19. Intermittent AF (<1% burden, longest 1:39). Rare PVCs.   Positive TB test     Past Surgical  History:  Procedure Laterality Date   CESAREAN SECTION     x 2   EXCISION METACARPAL MASS Right 12/17/2017   Procedure: EXCISION METACARPAL MASS;  Surgeon: Kennedy Bucker, MD;  Location: [facility] ORS;  Service: Orthopedics;  Laterality: Right;     Current Outpatient Medications  Medication Sig Dispense Refill   acetaminophen (TYLENOL) 325 MG tablet Take 325 mg by mouth every 6 (six) hours as needed for headache.      amLODipine (NORVASC) 5 MG tablet TAKE 1 TABLET(5 MG) BY MOUTH DAILY 90 tablet 2   Carboxymethylcellul-Glycerin (LUBRICATING EYE DROPS OP) Place 2 drops into both eyes daily as needed (dry eyes).     ELIQUIS 5 MG TABS tablet TAKE 1 TABLET(5 MG) BY MOUTH TWICE DAILY 180 tablet 2   losartan (COZAAR) 100 MG tablet Take 1 tablet (100 mg total) by mouth daily. 90 tablet 3   methimazole (TAPAZOLE) 5 MG tablet Take 0.5 tablets (2.5 mg total) by mouth daily. 30 tablet 5   metoprolol tartrate (LOPRESSOR) 25 MG tablet TAKE 1/2 TABLET(12.5 MG) BY MOUTH TWICE DAILY 90 tablet 0   Multiple Vitamin (MULTIVITAMIN WITH MINERALS) TABS tablet Take 1 tablet by mouth daily.     rosuvastatin (CRESTOR) 5 MG tablet Take 5 mg by mouth at bedtime.      spironolactone (ALDACTONE) 25 MG tablet TAKE 1/2 TABLET(12.5 MG) BY MOUTH DAILY 45 tablet 0   No current facility-administered medications for this visit.  Allergies:   Patient has no known allergies.    Social History:  The patient  reports that she has never smoked. She has never used smokeless tobacco. She reports that she does not drink alcohol and does not use drugs.   Family History:  The patient's family history includes Cancer in her father and mother; Thyroid cancer in her daughter.    ROS:  Please see the history of present illness.   Otherwise, review of systems are positive for none.   All other systems are reviewed and negative.    PHYSICAL EXAM: VS:  BP 132/70 (BP Location: Left Arm, Patient Position: Sitting, Cuff Size: Normal)    Pulse 66   Ht 5\' 3"  (1.6 m)   Wt 160 lb (72.6 kg)   SpO2 98%   BMI 28.34 kg/m  , BMI Body mass index is 28.34 kg/m. GEN: Well nourished, well developed, in no acute distress  HEENT: normal  Neck: no JVD, carotid bruits, or masses Cardiac: RRR; no  rubs, or gallops,no edema . 1/ 6 systolic ejection murmur in the pulmonic area which is early peaking. Respiratory:  clear to auscultation bilaterally, normal work of breathing GI: soft, nontender, nondistended, + BS MS: no deformity or atrophy  Skin: warm and dry, no rash Neuro:  Strength and sensation are intact Psych: euthymic mood, full affect   EKG:  EKG is  ordered today. EKG showed normal sinus rhythm with no significant ST or T wave changes.   Recent Labs: 03/14/2021: TSH 3.53    Lipid Panel    Component Value Date/Time   LDLDIRECT 74.0 06/07/2015 1057      Wt Readings from Last 3 Encounters:  05/23/21 160 lb (72.6 kg)  03/14/21 157 lb 6.4 oz (71.4 kg)  11/22/20 162 lb (73.5 kg)      ASSESSMENT AND PLAN:  1.  Paroxysmal atrial fibrillation: Doing well overall on metoprolol 12.5 mg twice daily.  She is tolerating anticoagulation with no side effects.  Given overall low burden, no indication for ablation or antiarrhythmic medication.  I reviewed most recent labs done in May which showed stable creatinine of 1.1.  CBC was normal.  2.  Essential hypertension: Her blood pressure is well controlled on current medications.  She is on spironolactone and last potassium was normal at 4.3.    3.  Hyperlipidemia: On rosuvastatin.  I reviewed lipid profile done in January which showed a triglyceride of 143 and an LDL of 42.  4.  Hypothyroidism: She is back on small dose methimazole.  I reviewed most recent labs which showed normal TSH.    Disposition:   FU with me in 6 months  Signed,  February, MD  05/23/2021 2:43 PM    [facility]

## 2021-05-23 NOTE — Patient Instructions (Signed)

## 2021-06-14 ENCOUNTER — Other Ambulatory Visit: Payer: Self-pay | Admitting: Cardiovascular Disease

## 2021-06-23 ENCOUNTER — Other Ambulatory Visit: Payer: Self-pay | Admitting: Physician Assistant

## 2021-06-24 NOTE — Telephone Encounter (Signed)
Prescription refill request for Eliquis received. Last office visit:arida 05/23/21 Scr:1.10 01/09/21 Age: 89f Weight:72.6kg

## 2021-06-24 NOTE — Telephone Encounter (Signed)
Refill request

## 2021-06-28 ENCOUNTER — Telehealth: Payer: Self-pay | Admitting: Cardiovascular Disease

## 2021-06-28 NOTE — Telephone Encounter (Signed)
*  STAT* If patient is at the pharmacy, call can be transferred to refill team.   1. Which medications need to be refilled? (please list name of each medication and dose if known) Losartin 100mg  I tablet daily   2. Which pharmacy/location (including street and city if local pharmacy) is medication to be sent to? [facility]  3. Do they need a 30 day or 90 day supply? 90 day

## 2021-06-30 ENCOUNTER — Other Ambulatory Visit: Payer: Self-pay | Admitting: Cardiovascular Disease

## 2021-07-01 MED ORDER — LOSARTAN POTASSIUM 100 MG PO TABS: 100.0000 mg | ORAL_TABLET | Freq: Every day | ORAL | 1 refills | Status: DC

## 2021-07-01 NOTE — Telephone Encounter (Signed)
Cancelled refill sent to [facility] in [place] by phone.

## 2021-07-01 NOTE — Telephone Encounter (Signed)
*  STAT* If patient is at the pharmacy, call can be transferred to refill team.   1. Which medications need to be refilled? (please list name of each medication and dose if known)    Spironolactone 12.5 mg po q d   2. Which pharmacy/location (including street and city if local pharmacy) is medication to be sent to? [facility] [place]   3. Do they need a 30 day or 90 day supply? 90

## 2021-07-01 NOTE — Addendum Note (Signed)
Addended by: Kendrick Fries on: 07/01/2021 09:26 AM   Modules accepted: Orders

## 2021-07-01 NOTE — Telephone Encounter (Signed)
Requested Prescriptions   Signed Prescriptions Disp Refills   losartan (COZAAR) 100 MG tablet 90 tablet 1    Sig: Take 1 tablet (100 mg total) by mouth daily.    Authorizing Provider: Lorine Bears A    Ordering User: Kendrick Fries

## 2021-07-24 ENCOUNTER — Ambulatory Visit: Payer: Medicare PPO | Admitting: Pulmonary Disease

## 2021-07-24 ENCOUNTER — Other Ambulatory Visit: Payer: Self-pay

## 2021-07-24 ENCOUNTER — Encounter: Payer: Self-pay | Admitting: Pulmonary Disease

## 2021-07-24 VITALS — BP 146/86 | HR 71 | Temp 97.1°F | Ht 63.0 in | Wt 161.6 lb

## 2021-07-24 DIAGNOSIS — I2729 Other secondary pulmonary hypertension: Secondary | ICD-10-CM

## 2021-07-24 DIAGNOSIS — I272 Pulmonary hypertension, unspecified: Secondary | ICD-10-CM | POA: Diagnosis not present

## 2021-07-24 DIAGNOSIS — E0500 Thyrotoxicosis with diffuse goiter without thyrotoxic crisis or storm: Secondary | ICD-10-CM

## 2021-07-24 DIAGNOSIS — I5189 Other ill-defined heart diseases: Secondary | ICD-10-CM

## 2021-07-24 DIAGNOSIS — G4736 Sleep related hypoventilation in conditions classified elsewhere: Secondary | ICD-10-CM

## 2021-07-24 NOTE — Patient Instructions (Addendum)
We will get another echocardiogram in February 2023 we will make sure that you have a female sonographer.  We will see him in follow-up in 6 months time call sooner should any new problems arise.

## 2021-07-24 NOTE — Progress Notes (Signed)
Subjective:    Patient ID: Doris [name], female    DOB: [birth year], 75 y.o.   MRN: [id number] Chief Complaint  Patient presents with   Follow-up    Pulmonary hypertension.     HPI Patient is a 75 year old lifelong never smoker who follows here for the issue of pulmonary hypertension initially classified as moderate by echocardiogram.  Etiology of her pulmonary hypertension likely related to Graves' disease and hypertension associated with this.  She has diastolic dysfunction and atrial fibrillation follows with cardiology for this.  She has nocturnal hypoxemia associated with her pulmonary hypertension tension with no overt symptoms of sleep apnea.  Oxygen supplementation is only 1 L/min at nighttime.  She continues to note marked improvement in sleep quality and alertness during the day.  She does not endorse dyspnea, chest pain, paroxysmal nocturnal dyspnea or orthopnea.  No recent fevers, chills or sweats.  Weight is stable.   Most recent echocardiogram was performed on 09 October 2020 and showed that her pulmonary hypertension is mild.  All chambers are normal in size.  Very mild mitral regurgitation.  Review of Systems A 10 point review of systems was performed and it is as noted above otherwise negative.  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    amLODipine (NORVASC) 5 MG tablet TAKE 1 TABLET(5 MG) BY MOUTH DAILY   apixaban (ELIQUIS) 5 MG TABS  tablet TAKE 1 TABLET BY MOUTH TWICE A DAY   Carboxymethylcellul-Glycerin (LUBRICATING EYE DROPS OP) Place 2 drops into both eyes daily as needed (dry eyes).   losartan (COZAAR) 100 MG tablet Take 1 tablet (100 mg total) by mouth daily.   methimazole (TAPAZOLE) 5 MG tablet Take 0.5 tablets (2.5 mg total) by mouth daily.   metoprolol tartrate (LOPRESSOR) 25 MG tablet TAKE 1/2 TABLET(12.5 MG) BY MOUTH TWICE DAILY   Multiple Vitamin (MULTIVITAMIN WITH MINERALS) TABS tablet Take 1 tablet by mouth daily.   rosuvastatin (CRESTOR) 5 MG tablet Take 5 mg by mouth at bedtime.    spironolactone (ALDACTONE) 25 MG tablet TAKE 1/2 TABLET(12.5 MG) BY MOUTH DAILY   Immunization History  Administered Date(s) Administered   Fluad Quad(high Dose 65+) 05/16/2020   Influenza Inj Mdck Quad Pf 05/16/2020   Influenza, Seasonal, Injecte, Preservative Fre 08/06/2011, 06/24/2012   Influenza,inj,Quad PF,6+ Mos 06/28/2014, 06/07/2015, 06/10/2016, 05/21/2017, 06/07/2018, 05/05/2019, 06/14/2021   Moderna Sars-Cov-2 Peds vaccine 76yrs thru 31yrs 01/07/2021   Moderna Sars-Covid-2 Vaccination 09/24/2019, 10/22/2019, 06/18/2020, 01/07/2021   Pneumococcal Conjugate-13 05/15/2015   Pneumococcal Polysaccharide-23 01/26/2014   Pneumococcal-Unspecified 05/15/2015   Td 10/02/2010   Tdap 10/02/2010        Objective:   Physical Exam BP (!) 146/86 (BP Location: Left Arm, Patient Position: Sitting, Cuff Size: Normal)   Pulse 71   Temp (!) 97.1 F (36.2 C) (Oral)   Ht 5\' 3"  (1.6 m)   Wt 161 lb 9.6 oz (73.3  kg)   SpO2 98%   BMI 28.63 kg/m   GENERAL: Well-developed, slightly overweight woman in no acute distress.  Fully ambulatory.  No conversational dyspnea. HEAD: Normocephalic, atraumatic.  EYES: Pupils equal, round, reactive to light.  No scleral icterus.  MOUTH: Nose/mouth/throat not examined due to masking requirements for COVID 19. NECK: Supple. No thyromegaly. Trachea midline. No JVD.  No adenopathy. PULMONARY: Good  air entry bilaterally.  No adventitious sounds. CARDIOVASCULAR: S1 and S2. Regular rate and rhythm.  No rubs, murmurs or gallops heard. ABDOMEN: Benign. MUSCULOSKELETAL: No joint deformity, no clubbing, no edema.  NEUROLOGIC: No focal deficits, no gait disturbance, speech is fluent.  No tremor. SKIN: Intact,warm,dry.  On limited exam no rashes PSYCH: Mood and behavior normal.      Assessment & Plan:     ICD-10-CM   1. Pulmonary hypertension (HCC)  I27.20 ECHOCARDIOGRAM COMPLETE   Continue management of BP/Graves' disease Continue oxygen supplementation nocturnally Recheck echocardiogram February 2023 (1 year)    2. Nocturnal hypoxemia due to pulmonary hypertension (HCC)  I27.29    G47.36    Continue oxygen at 1 L/min    3. Diastolic dysfunction  I51.89    Management of blood pressure is key Managed by cardiology    4. Graves disease  E05.00    On Tapazole Managed by endocrinology Issue adds complexity to her management.     Orders Placed This Encounter  Procedures   ECHOCARDIOGRAM COMPLETE    Female sonographer only.    Standing Status:   Future    Standing Expiration Date:   01/21/2022    Scheduling Instructions:     In February 2023    Order Specific Question:   Where should this test be performed    Answer:   [facility]    Order Specific Question:   Please indicate who you request to read the nuc med / echo results.    Answer:   [facility]    Order Specific Question:   Perflutren DEFINITY (image enhancing agent) should be administered unless hypersensitivity or allergy exist    Answer:   Administer Perflutren    Order Specific Question:   Reason for exam-Echo    Answer:   Pulmonary hypertension I27.2   Etiology of her pulmonary hypertension is likely multifactorial and due to Graves' disease (high incidence of mild to moderate pulmonary hypertension in this population) and diastolic dysfunction.  She is to continue nocturnal oxygen supplementation.   Improvement on her parameters likely due to control of her Graves' disease, control of her hypertension and nocturnal oxygen supplementation.   We will see the patient in 6 months time.  She will have a 2D echo performed in February 2023 this will be 1 year from her previous study.   Gailen Shelter, MD Advanced Bronchoscopy PCCM [facility]    *This note was dictated using voice recognition software/Dragon.  Despite best efforts to proofread, errors can occur which can change the meaning.  Any change was purely unintentional.

## 2021-08-22 ENCOUNTER — Other Ambulatory Visit: Payer: Self-pay | Admitting: Internal Medicine

## 2021-09-13 ENCOUNTER — Telehealth: Payer: Self-pay

## 2021-09-13 ENCOUNTER — Other Ambulatory Visit: Payer: Self-pay | Admitting: *Deleted

## 2021-09-13 MED ORDER — AMLODIPINE BESYLATE 5 MG PO TABS: ORAL_TABLET | ORAL | 0 refills | Status: DC

## 2021-09-13 MED ORDER — AMLODIPINE BESYLATE 5 MG PO TABS: 5.0000 mg | ORAL_TABLET | Freq: Every day | ORAL | 0 refills | Status: DC

## 2021-09-13 NOTE — Telephone Encounter (Signed)
1. Which medications need to be refilled? (please list name of each medication and dose if known) Amlodipine  2. Which pharmacy/location (including street and city if local pharmacy) is medication to be sent to? [facility]  3. Do they need a 30 day or 90 day supply? 90

## 2021-09-16 ENCOUNTER — Ambulatory Visit: Payer: Medicare PPO | Admitting: Internal Medicine

## 2021-09-17 ENCOUNTER — Other Ambulatory Visit: Payer: Self-pay

## 2021-09-17 MED ORDER — AMLODIPINE BESYLATE 5 MG PO TABS: 5.0000 mg | ORAL_TABLET | Freq: Every day | ORAL | 0 refills | Status: DC

## 2021-09-17 NOTE — Telephone Encounter (Signed)
Patient is out of medication  Refill was sent to wrong pharmacy  [facility]

## 2021-09-17 NOTE — Telephone Encounter (Signed)
Resent to correct pharmacy for patient   amLODipine (NORVASC) 5 MG tablet 90 tablet 0 09/17/2021    Sig - Route: Take 1 tablet (5 mg total) by mouth daily. - Oral    [facility]acy  [id number] [place].

## 2021-09-18 ENCOUNTER — Other Ambulatory Visit: Payer: Self-pay

## 2021-09-18 ENCOUNTER — Ambulatory Visit: Payer: Medicare PPO | Admitting: Internal Medicine

## 2021-09-18 ENCOUNTER — Encounter: Payer: Self-pay | Admitting: Internal Medicine

## 2021-09-18 VITALS — BP 120/74 | HR 57 | Ht 63.0 in | Wt 161.2 lb

## 2021-09-18 DIAGNOSIS — E0500 Thyrotoxicosis with diffuse goiter without thyrotoxic crisis or storm: Secondary | ICD-10-CM

## 2021-09-18 LAB — T3, FREE: T3, Free: 3.1 pg/mL (ref 2.3–4.2)

## 2021-09-18 LAB — T4, FREE: Free T4: 0.74 ng/dL (ref 0.60–1.60)

## 2021-09-18 LAB — TSH: TSH: 4.62 u[IU]/mL (ref 0.35–5.50)

## 2021-09-18 NOTE — Progress Notes (Signed)
Patient ID: Doris [name], female   DOB: 1945/12/25, 76 y.o.   MRN: [id number]  This visit occurred during the SARS-CoV-2 public health emergency.  Safety protocols were in place, including screening questions prior to the visit, additional usage of staff PPE, and extensive cleaning of exam room while observing appropriate contact time as indicated for disinfecting solutions.   HPI  Doris [name] is a 76 y.o.-year-old very pleasant female, initially referred by her PCP, Tacey Ruiz, NP (Dr Barron Alvine [facility]), presenting for follow-up for Graves ds. Last visit 6 months ago.  Interim history: No tremors, palpitations, unintentional weight loss, heat intolerance.  No complaints at this visit. She had bronchitis 3 weeks ago >> used an inhaler.   Reviewed history: She had a gastroenteritis on 10/25/2014 >> felt weak after this. She presented to [facility] for the weakness - TSH found low mid-March. She then had the TSH rechecked at a visit with PCP: TSH still low.   She had weakness in the past with her thyrotoxic episodes, including winter 2016 >> TFTs abnormal >> restarted MMI 5 mg daily in 10/2015 >> we were able to decrease the dose to 2.5 mg daily (5 mg qod) in 12/2015.   12/2017: TFTs were normal on 2.5 mg of methimazole daily.  01/2019: We stopped methimazole after a TSH returned slightly elevated  TFTs were normal afterwards until 08/2020. She has chronic sinusitis with frequent exacerbations.   A TSH was low x2 in 08/2020.  We started back on methimazole 2.5 mg daily (she takes 5 mg every other day).  Reviewed patient's TFTs: Lab Results  Component Value Date   TSH 3.53 03/14/2021   TSH 1.49 10/22/2020   TSH 0.15 (L) 09/13/2020   TSH 0.71 11/24/2019   TSH 1.78 03/09/2019   FREET4 0.79 03/14/2021   FREET4 0.71 10/22/2020   FREET4 1.03 09/13/2020   FREET4 0.94 11/24/2019   FREET4 0.72 03/09/2019  09/07/2020: TSH 0.173 (0.6-3.3) 04/06/2019: TSH 2.14, free T4  1.18, free T3 2.95, all normal 02/10/2019: TSH 6.6 11/21/2014: TSH 0.229 (0.450-4.5), TT4 11.9 (4.5-12)  11/04/2014: TSH 0.326 02/2014: TSH 2.530  Her TSI level was normal at last check: Lab Results  Component Value Date   TSI 108 01/11/2018    05/29/2015: Thyroid uptake and scan: The 24 hour radioactive iodine uptake is equal to 40.9%. There is uniform tracer uptake throughout both lobes. No dominant hot or cold nodule identified. IMPRESSION: 1. Elevated 24 hour radioactive uptake equal 40.9%. 2. Patient's hyperthyroidism may be due to Graves disease.  She saw Dr. Kirke Corin in the past for palpitations.  She was diagnosed with atrial fibrillation.  On Eliquis and Lopressor.  She also has pulmonary hypertension.  Pt denies: - feeling nodules in neck - hoarseness - dysphagia - choking - SOB with lying down  + Family history of thyroid cancer in daughter. No h/o radiation tx to head or neck.  No recent contrast studies. No herbal supplements. No Biotin use. No recent steroids use.   She has 2 daughters (one in [place] and one near [place]).  ROS: + see HPI  I reviewed pt's medications, allergies, PMH, social hx, family hx, and changes were documented in the history of present illness. Otherwise, unchanged from my initial visit note.  Past Medical History:  Diagnosis Date   Graves disease    Hyperlipidemia    Hypertension    Hyperthyroidism    PAF (paroxysmal atrial fibrillation) (HCC)    a. Dx  03/2019 -->AF RVR @ [facility]; b. 04/2019 Zio: <1% Afib burden; c. CHA2DS2VASc = 3-->Eliquis.   Palpitations    a. 05/2017 Echo: EF 60-65%; b. 05/2017 48h Holter: Rare PACs and PVCs. 11 beat run of SVT w/ aberrancy; c. 04/2019 Zio: Avg HR 56. 37 episodes of SVT, longest 19. Intermittent AF (<1% burden, longest 1:39). Rare PVCs.   Positive TB test    Past Surgical History:  Procedure Laterality Date   CESAREAN SECTION     x 2   EXCISION METACARPAL MASS Right 12/17/2017   Procedure: EXCISION  METACARPAL MASS;  Surgeon: Kennedy BuckerMenz, Michael, MD;  Location: [facility] ORS;  Service: Orthopedics;  Laterality: Right;    History   Social History   Marital Status: Married    Spouse Name: N/A   Number of Children: 5   Occupational History   Retired Runner, broadcasting/film/videoteacher   Social History Main Topics   Smoking status: Never Smoker    Smokeless tobacco: Not on file   Alcohol Use: No   Drug Use: No   Current Outpatient Medications on File Prior to Visit  Medication Sig Dispense Refill   acetaminophen (TYLENOL) 325 MG tablet Take 325 mg by mouth every 6 (six) hours as needed for headache.      amLODipine (NORVASC) 5 MG tablet Take 1 tablet (5 mg total) by mouth daily. 90 tablet 0   apixaban (ELIQUIS) 5 MG TABS tablet TAKE 1 TABLET BY MOUTH TWICE A DAY 180 tablet 1   Carboxymethylcellul-Glycerin (LUBRICATING EYE DROPS OP) Place 2 drops into both eyes daily as needed (dry eyes).     losartan (COZAAR) 100 MG tablet Take 1 tablet (100 mg total) by mouth daily. 90 tablet 1   methimazole (TAPAZOLE) 5 MG tablet TAKE 1/2 TABLET BY MOUTH DAILY 45 tablet 3   metoprolol tartrate (LOPRESSOR) 25 MG tablet TAKE 1/2 TABLET(12.5 MG) BY MOUTH TWICE DAILY 90 tablet 2   Multiple Vitamin (MULTIVITAMIN WITH MINERALS) TABS tablet Take 1 tablet by mouth daily.     rosuvastatin (CRESTOR) 5 MG tablet Take 5 mg by mouth at bedtime.      spironolactone (ALDACTONE) 25 MG tablet TAKE 1/2 TABLET(12.5 MG) BY MOUTH DAILY 45 tablet 0   No current facility-administered medications on file prior to visit.   No Known Allergies   FH: - HTN in M - HL in M, sister - also see HPI  PE: BP 120/74 (BP Location: Right Arm, Patient Position: Sitting, Cuff Size: Normal)    Pulse (!) 57    Ht 5\' 3"  (1.6 m)    Wt 161 lb 3.2 oz (73.1 kg)    SpO2 98%    BMI 28.56 kg/m   Wt Readings from Last 3 Encounters:  09/18/21 161 lb 3.2 oz (73.1 kg)  07/24/21 161 lb 9.6 oz (73.3 kg)  05/23/21 160 lb (72.6 kg)   Constitutional: normal weight, in NAD Eyes:  PERRLA, EOMI, no exophthalmos ENT: moist mucous membranes, no thyromegaly, no cervical lymphadenopathy Cardiovascular: RRR, No MRG Respiratory: CTA B Musculoskeletal: no deformities, strength intact in all 4 Skin: moist, warm, no rashes Neurological: no tremor with outstretched hands, DTR normal in all 4  ASSESSMENT: 1. Graves ds.   PLAN:  1.  -Patient with history of Graves' disease with improved TFTs on methimazole, which we were able to decrease and then stopped completely in 01/2019.  TFTs remained controlled for 1.5 years, but afterwards, we had to restart methimazole since TSH dropped lower than the lower limit of  normal (but with still normal free thyroid hormones). -She continues on low-dose methimazole, 2.5 mg daily.  If she tolerates it well. -At this visit, she denies hyperthyroid symptoms: Hot flashes, palpitations, unintentional weight loss, anxiety, insomnia, tremors, hyper defecation.  Before last visit she had mild intentional weight loss after changing her diet (low fat) to decrease the risk of gallbladder stones.  She gained 4 pounds since then. -She continues on beta-blocker: Lopressor 12.5 mg twice a day.  No tachycardia. -We will recheck her TFTs today and change the methimazole dose accordingly -She prefers to be called with results, rather than joining MyChart -I will see her in 1 year but with labs in 6 months  Component     Latest Ref Rng & Units 09/18/2021  T4,Free(Direct)     0.60 - 1.60 ng/dL 7.98  Triiodothyronine,Free,Serum     2.3 - 4.2 pg/mL 3.1  TSH     0.35 - 5.50 uIU/mL 4.62  Thyroid tests are normal.  We will repeat the labs again in 6 months.  Carlus Pavlov, MD PhD [facility] Endocrinology

## 2021-09-18 NOTE — Patient Instructions (Signed)
Please continue  methimazole 2.5 mg daily.  Please stop at the lab.  Please come back for a follow-up appointment in 1 year but for labs in 6 months.

## 2021-09-26 ENCOUNTER — Other Ambulatory Visit: Payer: Self-pay | Admitting: Cardiovascular Disease

## 2021-10-10 ENCOUNTER — Ambulatory Visit: Payer: Medicare PPO

## 2021-11-18 ENCOUNTER — Other Ambulatory Visit: Payer: Self-pay

## 2021-11-18 ENCOUNTER — Ambulatory Visit
Admission: RE | Admit: 2021-11-18 | Discharge: 2021-11-18 | Disposition: A | Discharge status: Home / Self Care | Payer: Medicare PPO | Source: Ambulatory Visit | Attending: Pulmonary Disease | Admitting: Pulmonary Disease

## 2021-11-18 DIAGNOSIS — I272 Pulmonary hypertension, unspecified: Secondary | ICD-10-CM | POA: Diagnosis not present

## 2021-11-18 DIAGNOSIS — E0500 Thyrotoxicosis with diffuse goiter without thyrotoxic crisis or storm: Secondary | ICD-10-CM | POA: Diagnosis not present

## 2021-11-18 DIAGNOSIS — Z9981 Dependence on supplemental oxygen: Secondary | ICD-10-CM | POA: Insufficient documentation

## 2021-11-18 DIAGNOSIS — I081 Rheumatic disorders of both mitral and tricuspid valves: Secondary | ICD-10-CM | POA: Diagnosis not present

## 2021-11-18 DIAGNOSIS — E785 Hyperlipidemia, unspecified: Secondary | ICD-10-CM | POA: Insufficient documentation

## 2021-11-18 DIAGNOSIS — I10 Essential (primary) hypertension: Secondary | ICD-10-CM | POA: Insufficient documentation

## 2021-11-18 LAB — ECHOCARDIOGRAM COMPLETE
AR max vel: 2.2 cm2
AV Area VTI: 1.93 cm2
AV Area mean vel: 1.93 cm2
AV Mean grad: 3 mmHg
AV Peak grad: 5.4 mmHg
Ao pk vel: 1.16 m/s
Area-P 1/2: 2.72 cm2
MV VTI: 1.35 cm2
S' Lateral: 2.98 cm

## 2021-11-22 ENCOUNTER — Ambulatory Visit: Payer: Medicare PPO | Admitting: Nurse Practitioner

## 2021-11-22 ENCOUNTER — Encounter: Payer: Self-pay | Admitting: Nurse Practitioner

## 2021-11-22 VITALS — BP 138/82 | HR 62 | Ht 63.0 in | Wt 161.0 lb

## 2021-11-22 DIAGNOSIS — I272 Pulmonary hypertension, unspecified: Secondary | ICD-10-CM | POA: Diagnosis not present

## 2021-11-22 DIAGNOSIS — I471 Supraventricular tachycardia: Secondary | ICD-10-CM

## 2021-11-22 DIAGNOSIS — I10 Essential (primary) hypertension: Secondary | ICD-10-CM

## 2021-11-22 DIAGNOSIS — E782 Mixed hyperlipidemia: Secondary | ICD-10-CM

## 2021-11-22 DIAGNOSIS — I480 Paroxysmal atrial fibrillation: Secondary | ICD-10-CM

## 2021-11-22 NOTE — Patient Instructions (Signed)
Medication Instructions:  ?No changes at this time.  ? ?*If you need a refill on your cardiac medications before your next appointment, please call your pharmacy* ? ? ?Lab Work: ?None ? ?If you have labs (blood work) drawn today and your tests are completely normal, you will receive your results only by: ?MyChart Message (if you have MyChart) OR ?A paper copy in the mail ?If you have any lab test that is abnormal or we need to change your treatment, we will call you to review the results. ? ? ?Testing/Procedures: ?None ? ? ?Follow-Up: ?At [facility], you and your health needs are our priority.  As part of our continuing mission to provide you with exceptional heart care, we have created designated Provider Care Teams.  These Care Teams include your primary Cardiologist (physician) and Advanced Practice Providers (APPs -  Physician Assistants and Nurse Practitioners) who all work together to provide you with the care you need, when you need it. ? ?We recommend signing up for the patient portal called "MyChart".  Sign up information is provided on this After Visit Summary.  MyChart is used to connect with patients for Virtual Visits (Telemedicine).  Patients are able to view lab/test results, encounter notes, upcoming appointments, etc.  Non-urgent messages can be sent to your provider as well.   ?To learn more about what you can do with MyChart, go to [email or web address].   ? ?Your next appointment:   ?1 year(s) ? ?The format for your next appointment:   ?In Person ? ?Provider:   ?Lorine Bears, MD  ?

## 2021-11-22 NOTE — Progress Notes (Signed)
? ? ?Office Visit  ?  ?Patient Name: Doris [name] ?Date of Encounter: 11/22/2021 ? ?Primary Care Provider:  Tacey Ruiz, FNP ?Primary Cardiologist:  Lorine Bears, MD ? ?Chief Complaint  ?  ?76 year old female with a history of paroxysmal atrial fibrillation, PSVT, pulmonary hypertension, essential hypertension, hyperlipidemia, and hyperthyroidism, who presents for follow-up paroxysmal A-fib and pulmonary hypertension. ? ?Past Medical History  ?  ?Past Medical History:  ?Diagnosis Date  ? Graves disease   ? Hyperlipidemia   ? Hypertension   ? Hyperthyroidism   ? PAF (paroxysmal atrial fibrillation) (HCC)   ? a. Dx 03/2019 -->AF RVR @ [facility]; b. 04/2019 Zio: <1% Afib burden; c. CHA2DS2VASc = 3-->Eliquis.  ? PAH (pulmonary artery hypertension) (HCC)   ? a. 05/2017 Echo: EF 60-65%, PASP ; b. 04/2019 Echo: EF 60-65% PASP ; c. 10/2021 Echo: EF 60-65%, no rwma, RVSP 42.26mmHg. Mild MR. Mod TR.  ? Palpitations   ? a. 05/2017 Echo: EF 60-65%; b. 05/2017 48h Holter: Rare PACs and PVCs. 11 beat run of SVT w/ aberrancy; c. 04/2019 Zio: Avg HR 56. 37 episodes of SVT, longest 19. Intermittent AF (<1% burden, longest 1:39). Rare PVCs.  ? Positive TB test   ? Valvular heart disease   ? a. 10/2021 Echo: Mild MR, Mod TR.  ? ?Past Surgical History:  ?Procedure Laterality Date  ? CESAREAN SECTION    ? x 2  ? EXCISION METACARPAL MASS Right 12/17/2017  ? Procedure: EXCISION METACARPAL MASS;  Surgeon: Kennedy Bucker, MD;  Location: [facility] ORS;  Service: Orthopedics;  Laterality: Right;  ? ? ?Allergies ? ?No Known Allergies ? ?History of Present Illness  ?  ?76 year old female with the above past medical history including paroxysmal atrial fibrillation, PSVT, pulmonary hypertension, essential hypertension, hyperlipidemia, and hyperthyroidism.  She was previously evaluated in late 2018 for palpitations with monitoring at that time showing rare PACs and PVCs and an 11 beat run of SVT with aberrancy.  Echocardiogram in October 2018  showed normal LV function with grade 1 diastolic dysfunction, as well as pulmonary hypertension with a PASP of 38 mmHg.  She was diagnosed with atrial fibrillation following presentation to [facility] in August 2020.  Subsequent monitoring showed an A-fib burden of less than 1% with the longest episode lasting 1 minute and 39 seconds.  37 runs of SVT were also noted.  She has been managed with beta-blocker and Eliquis therapy.  In the setting of pulmonary hypertension, she has been followed by pulmonology, and has been wearing oxygen at night.  Most recent echo earlier this month showed normal LV function with an RVSP of 42.9 mmHg. ? ?Over the past 6 months, she has done well.  She remains active without symptoms or limitations.  She does sleep with oxygen at night in the setting of pulmonary hypertension.  She denies chest pain, dyspnea, palpitations, PND, orthopnea, dizziness, syncope, edema, or early satiety. ? ?Home Medications  ?  ?Current Outpatient Medications  ?Medication Sig Dispense Refill  ? acetaminophen (TYLENOL) 325 MG tablet Take 325 mg by mouth every 6 (six) hours as needed for headache.     ? amLODipine (NORVASC) 5 MG tablet Take 1 tablet (5 mg total) by mouth daily. 90 tablet 0  ? apixaban (ELIQUIS) 5 MG TABS tablet TAKE 1 TABLET BY MOUTH TWICE A DAY 180 tablet 1  ? Carboxymethylcellul-Glycerin (LUBRICATING EYE DROPS OP) Place 2 drops into both eyes daily as needed (dry eyes).    ? losartan (COZAAR) 100 MG  tablet Take 1 tablet (100 mg total) by mouth daily. 90 tablet 1  ? methimazole (TAPAZOLE) 5 MG tablet TAKE 1/2 TABLET BY MOUTH DAILY 45 tablet 3  ? metoprolol tartrate (LOPRESSOR) 25 MG tablet TAKE 1/2 TABLET(12.5 MG) BY MOUTH TWICE DAILY 90 tablet 2  ? Multiple Vitamin (MULTIVITAMIN WITH MINERALS) TABS tablet Take 1 tablet by mouth daily.    ? rosuvastatin (CRESTOR) 5 MG tablet Take 5 mg by mouth at bedtime.     ? spironolactone (ALDACTONE) 25 MG tablet TAKE 1/2 TABLET(12.5 MG) BY MOUTH  DAILY 45 tablet 0  ? ?No current facility-administered medications for this visit.  ?  ? ?Review of Systems  ?  ?She denies chest pain, palpitations, dyspnea, pnd, orthopnea, n, v, dizziness, syncope, edema, weight gain, or early satiety.  All other systems reviewed and are otherwise negative except as noted above. ?  ? ?Physical Exam  ?  ?VS:  BP 138/82   Pulse 62   Ht 5\' 3"  (1.6 m)   Wt 161 lb (73 kg)   SpO2 98%   BMI 28.52 kg/m?  , BMI Body mass index is 28.52 kg/m?. ?    ?Vitals:  ? 11/22/21 1316 11/22/21 1648  ?BP: 140/82 138/82  ?Pulse: 62   ?SpO2: 98%   ?  ?GEN: Well nourished, well developed, in no acute distress. ?HEENT: normal. ?Neck: Supple, no JVD, carotid bruits, or masses. ?Cardiac: RRR, no murmurs, rubs, or gallops. No clubbing, cyanosis, edema.  Radials/PT 2+ and equal bilaterally.  ?Respiratory:  Respirations regular and unlabored, clear to auscultation bilaterally. ?GI: Soft, nontender, nondistended, BS + x 4. ?MS: no deformity or atrophy. ?Skin: warm and dry, no rash. ?Neuro:  Strength and sensation are intact. ?Psych: Normal affect. ? ?Accessory Clinical Findings  ?  ?Labs dated September 14, 2011 from care ? ?Sodium 138, potassium 4.8, chloride 102, CO2 26, BUN 21, creatinine 0.90, glucose 89 ?Calcium 9.3, total protein 7.4, albumin 4.7 ?Total bilirubin 0.9, alkaline phosphatase 82, AST 29, ALT 20 ?Total cholesterol 118, triglycerides 134, HDL 41, LDL 50 ?Hemoglobin A1c 5.6 ?Hemoglobin 13.5, hematocrit 39.5, WBC 7.4, platelets 262 ? ?Assessment & Plan  ?  ?1.  Paroxysmal atrial fibrillation: Patient is regular on exam today with a heart rate of 62, and has been feeling well without symptoms or limitations at home.  She remains on beta-blocker and Eliquis therapy.  Recent labs with stable H&H and renal function. ? ?2.  Essential hypertension: Blood pressure elevated today at 140/80 and 138/82 on repeat.  She is currently on losartan 100 mg daily, amlodipine 5 mg daily, and spironolactone 12.5  mg daily.  I have asked her to check her blood pressure at home over the next few weeks and contact September 16, 2011 if she is consistently trending greater than 130 systolic.  She did not previously tolerate amlodipine 10 mg secondary to lower extremity swelling therefore, if pressures are elevated, would look to increase spironolactone to a whole tablet daily. ? ?3.  PSVT: Quiescent.  Continue beta-blocker therapy. ? ?4.  Pulmonary arterial hypertension: Patient reasonably active without symptoms or limitations.  Recent echo showed normal LV function with an RVSP of 42.9 mmHg.  She wears oxygen at bedtime and is followed closely by pulmonology. ? ?5.  Hyperthyroidism: Followed by endocrinology and on methimazole with a TSH of 4.62 and free T4 of 0.74 In January. ? ?6.  Hyperlipidemia: LDL of 50 with normal LFTs in January.  Continue statin therapy. ? ?7.  Disposition: Patient  would like to move to an annual follow-up schedule therefore, she will follow-up in 1 year. ? ?Nicolasa Duckinghristopher Silvie Obremski, NP ?11/22/2021, 4:50 PM ? ?

## 2021-12-11 ENCOUNTER — Other Ambulatory Visit: Payer: Self-pay | Admitting: Cardiovascular Disease

## 2021-12-17 ENCOUNTER — Other Ambulatory Visit: Payer: Self-pay | Admitting: Cardiovascular Disease

## 2021-12-20 ENCOUNTER — Other Ambulatory Visit: Payer: Self-pay | Admitting: Cardiovascular Disease

## 2021-12-22 ENCOUNTER — Other Ambulatory Visit: Payer: Self-pay | Admitting: Cardiovascular Disease

## 2021-12-23 NOTE — Telephone Encounter (Signed)
Refill Request.  

## 2021-12-23 NOTE — Telephone Encounter (Signed)
Prescription refill request for Eliquis received. ?Indication: PAF ?Last office visit: 11/22/21  Doris Cancel NP ?Scr: 0.90 on 09/13/21 ?Age: 76 ?Weight: 73kg ? ?Based on above findings Eliquis 5mg  twice daily is the appropriate dose.  Refill approved. ? ?

## 2021-12-23 NOTE — Telephone Encounter (Signed)
Prescription refill request for Eliquis received. ?Indication:Afib ?Last office visit:3/23 ?Scr:0.9 ?Age: 76 ?Weight:73 kg ? ?Prescription refilled ? ?

## 2022-02-04 ENCOUNTER — Telehealth: Payer: Self-pay | Admitting: Internal Medicine

## 2022-02-04 NOTE — Telephone Encounter (Signed)
Patient declined recall .  States only needs arida at this time.   Deleting recall.

## 2022-02-10 NOTE — Telephone Encounter (Signed)
Noted  

## 2022-02-24 ENCOUNTER — Encounter: Payer: Self-pay | Admitting: Pulmonary Disease

## 2022-02-24 ENCOUNTER — Ambulatory Visit: Payer: Medicare PPO | Admitting: Pulmonary Disease

## 2022-02-24 VITALS — BP 128/74 | HR 64 | Temp 97.9°F | Ht 63.0 in | Wt 162.2 lb

## 2022-02-24 DIAGNOSIS — I5189 Other ill-defined heart diseases: Secondary | ICD-10-CM

## 2022-02-24 DIAGNOSIS — I272 Pulmonary hypertension, unspecified: Secondary | ICD-10-CM

## 2022-02-24 DIAGNOSIS — G4736 Sleep related hypoventilation in conditions classified elsewhere: Secondary | ICD-10-CM

## 2022-02-24 DIAGNOSIS — E0500 Thyrotoxicosis with diffuse goiter without thyrotoxic crisis or storm: Secondary | ICD-10-CM

## 2022-02-24 DIAGNOSIS — I2729 Other secondary pulmonary hypertension: Secondary | ICD-10-CM | POA: Diagnosis not present

## 2022-02-24 NOTE — Progress Notes (Signed)
Subjective:    Patient ID: Doris [name], female    DOB: [birth year], 76 y.o.   MRN: [id number] Patient Care Team: Tacey Ruiz, FNP as PCP - General (Family Medicine) [place] Ouch, MD as PCP - Cardiology (Cardiology) [place] Ouch, MD as Consulting Physician (Cardiology)  Chief Complaint  Patient presents with   Follow-up    No current sx.    HPI Patient is a 76 year old lifelong never smoker who follows here for the issue of pulmonary hypertension initially classified as moderate by echocardiogram August 2020.  Etiology of her pulmonary hypertension likely related to Graves' disease and diastolic dysfunction.  For diastolic dysfunction and atrial fibrillation she follows with cardiology.  She has nocturnal hypoxemia associated with her pulmonary hypertension with no overt symptoms of sleep apnea and a very low Epworth scale.  Oxygen supplementation is only 1 L/min at nighttime.  She continues to note marked improvement in sleep quality and resolution of dyspnea during the day.  She does not endorse dyspnea, chest pain, paroxysmal nocturnal dyspnea or orthopnea.  No recent fevers, chills or sweats. Weight is stable.  She had echocardiogram performed on 18 November 2021 that showed that her diastolic parameters were normal, mild elevation of the pulmonary artery systolic pressure, mild mitral regurgitation yet, moderate TR.   Review of Systems A 10 point review of systems was performed and it is as noted above otherwise negative.  Patient Active Problem List   Diagnosis Date Noted   Perceived hearing changes 09/03/2016   Graves disease 01/11/2016   Screening for breast cancer 06/11/2015   Encounter to establish care 06/11/2015   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   Hypertension    Social History   Tobacco Use   Smoking status: Never   Smokeless tobacco: Never  Substance Use Topics   Alcohol use: No    Alcohol/week: 0.0 standard drinks of alcohol   No Known  Allergies  Current Meds  Medication Sig   acetaminophen (TYLENOL) 325 MG tablet Take 325 mg by mouth every 6 (six) hours as needed for headache.    amLODipine (NORVASC) 5 MG tablet TAKE 1 TABLET (5 MG TOTAL) BY MOUTH DAILY.   Carboxymethylcellul-Glycerin (LUBRICATING EYE DROPS OP) Place 2 drops into both eyes daily as needed (dry eyes).   ELIQUIS 5 MG TABS tablet TAKE 1 TABLET BY MOUTH TWICE A DAY   losartan (COZAAR) 100 MG tablet TAKE 1 TABLET BY MOUTH EVERY DAY   methimazole (TAPAZOLE) 5 MG tablet TAKE 1/2 TABLET BY MOUTH DAILY   metoprolol tartrate (LOPRESSOR) 25 MG tablet TAKE 1/2 TABLET(12.5 MG) BY MOUTH TWICE DAILY   Multiple Vitamin (MULTIVITAMIN WITH MINERALS) TABS tablet Take 1 tablet by mouth daily.   rosuvastatin (CRESTOR) 5 MG tablet Take 5 mg by mouth at bedtime.    spironolactone (ALDACTONE) 25 MG tablet TAKE 1/2 TABLET(12.5 MG) BY MOUTH DAILY   Immunization History  Administered Date(s) Administered   Fluad Quad(high Dose 65+) 05/16/2020   Influenza Inj Mdck Quad Pf 05/16/2020   Influenza, Seasonal, Injecte, Preservative Fre 08/06/2011, 06/24/2012   Influenza,inj,Quad PF,6+ Mos 06/28/2014, 06/07/2015, 06/10/2016, 05/21/2017, 06/07/2018, 05/05/2019, 06/14/2021   Moderna Sars-Cov-2 Peds vaccine 67yrs thru 48yrs 01/07/2021   Moderna Sars-Covid-2 Vaccination 09/24/2019, 10/22/2019, 06/18/2020, 01/07/2021   Pneumococcal Conjugate-13 05/15/2015   Pneumococcal Polysaccharide-23 01/26/2014   Pneumococcal-Unspecified 05/15/2015   Td 10/02/2010   Tdap 10/02/2010       Objective:   Physical Exam BP 128/74 (BP Location: Left Arm, Cuff Size: Normal)  Pulse 64   Temp 97.9 F (36.6 C) (Temporal)   Ht 5\' 3"  (1.6 m)   Wt 162 lb 3.2 oz (73.6 kg)   SpO2 100%   BMI 28.73 kg/m  GENERAL: Well-developed, slightly overweight woman in no acute distress.  Fully ambulatory.  No conversational dyspnea. HEAD: Normocephalic, atraumatic.  EYES: Pupils equal, round, reactive to light.  No  scleral icterus.  MOUTH: Nose/mouth/throat not examined due to masking requirements for COVID 19. NECK: Supple. No thyromegaly. Trachea midline. No JVD.  No adenopathy. PULMONARY: Good air entry bilaterally.  No adventitious sounds. CARDIOVASCULAR: S1 and S2. Regular rate and rhythm.  No rubs, murmurs or gallops heard. ABDOMEN: Benign. MUSCULOSKELETAL: No joint deformity, no clubbing, no edema.  NEUROLOGIC: No focal deficits, no gait disturbance, speech is fluent.  No tremor. SKIN: Intact,warm,dry.  On limited exam no rashes PSYCH: Mood and behavior normal.     Assessment & Plan:     ICD-10-CM   1. Pulmonary hypertension (HCC)  I27.20    This is mild by last echo Continue to monitor Continue nocturnal oxygen supplementation    2. 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 Diastolic dysfunction  I51.89    Improved by last echo    4. Graves disease  E05.00    Patient currently on therapy with Tapazole      Etiology of her pulmonary hypertension is likely multifactorial and due to Graves' disease (high incidence of mild to moderate pulmonary hypertension in this population) and diastolic dysfunction.  She is to continue nocturnal oxygen supplementation.  Improvement on her parameters likely due to control of her Graves' disease, control of her hypertension and nocturnal oxygen supplementation.  We will see the patient in follow-up in 4 months time.  If she still doing well at that time we will proceed with repeating overnight oximetry on room air.   10 Oct 2019, MD Advanced Bronchoscopy PCCM [facility] [facility] [place]    *This note was dictated using voice recognition software/Dragon.  Despite best efforts to proofread, errors can occur which can change the meaning. Any transcriptional errors that result from this process are  unintentional and may not be fully corrected at the time of dictation.

## 2022-02-24 NOTE — Patient Instructions (Signed)
Continue using your oxygen as you are doing.  Last echocardiogram in March looked better.  We will see you in follow-up in 4 months time.  At that time if you are doing well we will order a repeat on the overnight oximetry.

## 2022-03-03 ENCOUNTER — Other Ambulatory Visit: Payer: Self-pay | Admitting: Cardiovascular Disease

## 2022-03-18 ENCOUNTER — Other Ambulatory Visit: Payer: Self-pay

## 2022-03-18 ENCOUNTER — Other Ambulatory Visit (INDEPENDENT_AMBULATORY_CARE_PROVIDER_SITE_OTHER): Payer: Medicare PPO

## 2022-03-18 DIAGNOSIS — E0500 Thyrotoxicosis with diffuse goiter without thyrotoxic crisis or storm: Secondary | ICD-10-CM

## 2022-03-18 LAB — T3, FREE: T3, Free: 2.4 pg/mL (ref 2.3–4.2)

## 2022-03-18 LAB — TSH: TSH: 5.08 u[IU]/mL (ref 0.35–5.50)

## 2022-03-18 LAB — T4, FREE: Free T4: 0.69 ng/dL (ref 0.60–1.60)

## 2022-04-01 NOTE — Telephone Encounter (Signed)
Closing encounter

## 2022-04-07 ENCOUNTER — Other Ambulatory Visit: Payer: Self-pay | Admitting: Cardiovascular Disease

## 2022-04-07 NOTE — Telephone Encounter (Signed)
Refill request

## 2022-04-07 NOTE — Telephone Encounter (Signed)
Prescription refill request for Eliquis received. Indication: PAF Last office visit: 11/22/21  Lysbeth Penner NP Scr: 1.10 on 03/14/22 Age:  76 Weight: 73kg  Based on above findings Eliquis 5mg  twice daily is the appropriate dose.  Refill approved.

## 2022-04-15 ENCOUNTER — Telehealth: Payer: Self-pay

## 2022-04-15 NOTE — Telephone Encounter (Signed)
Ok, lets make sure that she has a TSH, free T4, and free T3 in the system and she can come approximately 4 weeks after she stopped methimazole.

## 2022-04-15 NOTE — Telephone Encounter (Signed)
Pt called to advise she had stopped taking her Methimazole about 3 weeks ago and would like to come in sooner then her 6 week f/u lab appt at the end of the month. She reports feeling sluggish and tired some mornings since stopping.

## 2022-04-16 NOTE — Telephone Encounter (Signed)
Pt contacted and advised she was feeling a little better and it was only one day she was feeling bad. She prefers to wait until her appt next week.

## 2022-04-23 ENCOUNTER — Other Ambulatory Visit (INDEPENDENT_AMBULATORY_CARE_PROVIDER_SITE_OTHER): Payer: Medicare PPO

## 2022-04-23 DIAGNOSIS — E0500 Thyrotoxicosis with diffuse goiter without thyrotoxic crisis or storm: Secondary | ICD-10-CM | POA: Diagnosis not present

## 2022-04-23 LAB — T4, FREE: Free T4: 0.82 ng/dL (ref 0.60–1.60)

## 2022-04-23 LAB — T3, FREE: T3, Free: 3 pg/mL (ref 2.3–4.2)

## 2022-04-23 LAB — TSH: TSH: 0.72 u[IU]/mL (ref 0.35–5.50)

## 2022-05-20 ENCOUNTER — Telehealth: Payer: Self-pay | Admitting: Cardiovascular Disease

## 2022-05-20 NOTE — Telephone Encounter (Signed)
Spoke with the patient. Adv her that her HR is still within NL. Adv the patient that the one elevated reading is not concerning. Her BP is usually well controlled.  Adv the patient to continue to monitor her BP and to contact the office if her readings are consistently >140/90.  Patient verbalized understanding and voiced appreciation for the call back.

## 2022-05-20 NOTE — Telephone Encounter (Signed)
Pt c/o BP issue: STAT if pt c/o blurred vision, one-sided weakness or slurred speech  1. What are your last 5 BP readings?   Today (9/26) 133/75  HR 78  (3:58 pm) 157/90  HR 82  (2:58 pm)  9/21   126/74   HR 66 9/19    130/76  HR 66 9/15    127/75  HR 64 9/13     132/75  HR 66  2. Are you having any other symptoms (ex. Dizziness, headache, blurred vision, passed out)?   No  3. What is your BP issue?    Patient stated she had a flu shot yesterday and her BP has been running high.  Patient stated she also felt chills and believes that may be related to the flu shot.

## 2022-06-19 ENCOUNTER — Other Ambulatory Visit: Payer: Self-pay | Admitting: Cardiovascular Disease

## 2022-06-30 ENCOUNTER — Ambulatory Visit: Payer: Medicare PPO | Admitting: Pulmonary Disease

## 2022-06-30 ENCOUNTER — Encounter: Payer: Self-pay | Admitting: Pulmonary Disease

## 2022-06-30 VITALS — BP 120/76 | HR 63 | Temp 97.6°F | Ht 63.0 in | Wt 164.0 lb

## 2022-06-30 DIAGNOSIS — I2729 Other secondary pulmonary hypertension: Secondary | ICD-10-CM

## 2022-06-30 DIAGNOSIS — E0500 Thyrotoxicosis with diffuse goiter without thyrotoxic crisis or storm: Secondary | ICD-10-CM

## 2022-06-30 DIAGNOSIS — I272 Pulmonary hypertension, unspecified: Secondary | ICD-10-CM

## 2022-06-30 DIAGNOSIS — G4736 Sleep related hypoventilation in conditions classified elsewhere: Secondary | ICD-10-CM

## 2022-06-30 DIAGNOSIS — I5189 Other ill-defined heart diseases: Secondary | ICD-10-CM | POA: Diagnosis not present

## 2022-06-30 NOTE — Patient Instructions (Signed)
We are ordering another overnight oximetry (oxygen level).  The night that you do the test do not wear the oxygen so we can tell what you are doing without the oxygen.    We will schedule echocardiogram in 6 months with a follow-up visit with me after that.

## 2022-06-30 NOTE — Progress Notes (Signed)
Subjective:    Patient ID: Doris [name], female    DOB: 1946-08-16, 76 y.o.   MRN: [id number] Patient Care Team: Tacey Ruiz, FNP as PCP - General (Family Medicine) [place] Ouch, MD as Consulting Physician (Cardiology) Salena Saner, MD as Consulting Physician (Pulmonary Disease)  Chief Complaint  Patient presents with   Follow-up    Doing good. No SOB, cough or wheezing.   HPI  Patient is a 76 year old lifelong never smoker who follows here for the issue of pulmonary hypertension initially classified as moderate by echocardiogram August 2020.  Etiology of her pulmonary hypertension likely related to Graves' disease and diastolic dysfunction.  For diastolic dysfunction and atrial fibrillation she follows with cardiology.  She has nocturnal hypoxemia associated with her pulmonary hypertension with no overt symptoms of sleep apnea and a very low Epworth scale.  Oxygen supplementation is only 1 L/min at nighttime.  She continues to note marked improvement in sleep quality and resolution of dyspnea during the day.  She does not endorse dyspnea, chest pain, paroxysmal nocturnal dyspnea or orthopnea.  No recent fevers, chills or sweats. Weight is stable.   She had echocardiogram performed on 18 November 2021 that showed that her diastolic parameters were normal, mild elevation of the pulmonary artery systolic pressure, mild mitral regurgitation yet, moderate TR.  She discontinued Tapazole around the first part of August after discussion with endocrinology.  She remains off of it without untoward side effect.   Review of Systems A 10 point review of systems was performed and it is as noted above otherwise negative.  Past Medical History:  Diagnosis Date   Graves disease    Hyperlipidemia    Hypertension    Hyperthyroidism    PAF (paroxysmal atrial fibrillation) (HCC)    a. Dx 03/2019 -->AF RVR @ [facility]; b. 04/2019 Zio: <1% Afib burden; c. CHA2DS2VASc = 3-->Eliquis.   PAH  (pulmonary artery hypertension) (HCC)    a. 05/2017 Echo: EF 60-65%, PASP ; b. 04/2019 Echo: EF 60-65% PASP ; c. 10/2021 Echo: EF 60-65%, no rwma, RVSP 42.35mmHg. Mild MR. Mod TR.   Palpitations    a. 05/2017 Echo: EF 60-65%; b. 05/2017 48h Holter: Rare PACs and PVCs. 11 beat run of SVT w/ aberrancy; c. 04/2019 Zio: Avg HR 56. 37 episodes of SVT, longest 19. Intermittent AF (<1% burden, longest 1:39). Rare PVCs.   Positive TB test    Valvular heart disease    a. 10/2021 Echo: Mild MR, Mod TR.   Social History   Tobacco Use   Smoking status: Never   Smokeless tobacco: Never  Substance Use Topics   Alcohol use: No    Alcohol/week: 0.0 standard drinks of alcohol   No Known Allergies  Current Meds  Medication Sig   acetaminophen (TYLENOL) 325 MG tablet Take 325 mg by mouth every 6 (six) hours as needed for headache.    amLODipine (NORVASC) 5 MG tablet TAKE 1 TABLET (5 MG TOTAL) BY MOUTH DAILY.   Carboxymethylcellul-Glycerin (LUBRICATING EYE DROPS OP) Place 2 drops into both eyes daily as needed (dry eyes).   Cholecalciferol (VITAMIN D3) 25 MCG (1000 UT) CAPS Take by mouth. Take 1 capsule (25 mcg total) by mouth daily.   ELIQUIS 5 MG TABS tablet TAKE 1 TABLET BY MOUTH TWICE A DAY   losartan (COZAAR) 100 MG tablet TAKE 1 TABLET BY MOUTH EVERY DAY   metoprolol tartrate (LOPRESSOR) 25 MG tablet TAKE 1/2 TABLET(12.5 MG) BY MOUTH TWICE DAILY  Multiple Vitamin (MULTIVITAMIN WITH MINERALS) TABS tablet Take 1 tablet by mouth daily.   rosuvastatin (CRESTOR) 5 MG tablet Take 5 mg by mouth at bedtime.    spironolactone (ALDACTONE) 25 MG tablet TAKE 1/2 TABLET(12.5 MG) BY MOUTH DAILY   Immunization History  Administered Date(s) Administered   Fluad Quad(high Dose 65+) 05/16/2020, 05/19/2022   Influenza Inj Mdck Quad Pf 05/16/2020   Influenza, Seasonal, Injecte, Preservative Fre 08/06/2011, 06/24/2012   Influenza,inj,Quad PF,6+ Mos 06/28/2014, 06/07/2015, 06/10/2016, 05/21/2017,  06/07/2018, 05/05/2019, 06/14/2021   Moderna Sars-Cov-2 Peds vaccine 46yrs thru 72yrs 01/07/2021   Moderna Sars-Covid-2 Vaccination 09/24/2019, 10/22/2019, 06/18/2020, 01/07/2021   Pneumococcal Conjugate-13 05/15/2015   Pneumococcal Polysaccharide-23 01/26/2014   Pneumococcal-Unspecified 05/15/2015   Td 10/02/2010   Tdap 10/02/2010       Objective:   Physical Exam BP 120/76 (BP Location: Left Arm, Cuff Size: Normal)   Pulse 63   Temp 97.6 F (36.4 C)   Ht 5\' 3"  (1.6 m)   Wt 164 lb (74.4 kg)   SpO2 100%   BMI 29.05 kg/m  GENERAL: Well-developed, slightly overweight woman in no acute distress.  Fully ambulatory.  No conversational dyspnea. HEAD: Normocephalic, atraumatic.  EYES: Pupils equal, round, reactive to light.  No scleral icterus.  MOUTH: Nose/mouth/throat not examined due to masking requirements for COVID 19 (patient still wearing mask). NECK: Supple. No thyromegaly. Trachea midline. No JVD.  No adenopathy. PULMONARY: Good air entry bilaterally.  No adventitious sounds. CARDIOVASCULAR: S1 and S2. Regular rate and rhythm.  No rubs, murmurs or gallops heard. ABDOMEN: Benign. MUSCULOSKELETAL: No joint deformity, no clubbing, no edema.  NEUROLOGIC: No focal deficits, no gait disturbance, speech is fluent.  No tremor. SKIN: Intact,warm,dry.  On limited exam no rashes PSYCH: Mood and behavior normal.     Assessment & Plan:     ICD-10-CM   1. 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 Pulmonary hypertension (HCC)  I27.20 Pulse oximetry, overnight    ECHOCARDIOGRAM COMPLETE   Mild to very mild by last echocardiogram Multifactorial: Graves' disease/diastolic dysfunction    3. Diastolic dysfunction  I51.89    This issue adds complexity to her management Follows with cardiology    4. Graves disease  E05.00    Off of Tapazole since beginning of  August Follows with endocrinology     Orders Placed This Encounter  Procedures   Pulse oximetry, overnight    Room Air & [facility]    Standing Status:   Future    Standing Expiration Date:   07/01/2023   ECHOCARDIOGRAM COMPLETE    In 33months    Standing Status:   Future    Standing Expiration Date:   07/01/2023    Order Specific Question:   Where should this test be performed    Answer:   [facility] [place]    Order Specific Question:   Perflutren DEFINITY (image enhancing agent) should be administered unless hypersensitivity or allergy exist    Answer:   Administer Perflutren    Order Specific Question:   Reason for exam-Echo    Answer:   Pulmonary hypertension I27.2   We will notify the patient of the results of her overnight oximetry on room air.  We will reschedule another echocardiogram in 6 months time she is to follow after that.  She is to contact 13/01/2023 prior to that time should  any new difficulties arise.  Renold Don, MD Advanced Bronchoscopy PCCM [facility]    *This note was dictated using voice recognition software/Dragon.  Despite best efforts to proofread, errors can occur which can change the meaning. Any transcriptional errors that result from this process are unintentional and may not be fully corrected at the time of dictation.

## 2022-07-14 ENCOUNTER — Telehealth: Payer: Self-pay | Admitting: Pulmonary Disease

## 2022-07-14 NOTE — Telephone Encounter (Signed)
Dr. Jayme Cloud reviewed the patient's ONO results. Per Dr. Jayme Cloud, the patient still needs the O2. Sats drop to 85% on room air.  I notified the patient. Nothing further needed.

## 2022-07-22 ENCOUNTER — Encounter: Payer: Self-pay | Admitting: Pulmonary Disease

## 2022-07-30 ENCOUNTER — Telehealth: Payer: Self-pay | Admitting: Pulmonary Disease

## 2022-07-30 DIAGNOSIS — I272 Pulmonary hypertension, unspecified: Secondary | ICD-10-CM

## 2022-07-30 NOTE — Telephone Encounter (Signed)
Dr. Jayme Cloud saw patient on 06/30/22 order was placed to have Echo in 6 months.  It looks like the Echo order was CXL by Tempie Donning on 07/07/22

## 2022-08-05 NOTE — Telephone Encounter (Signed)
New Echo order has been placed.

## 2022-08-05 NOTE — Telephone Encounter (Signed)
I just spoke with Sabrina with [facility] and she stated she CXL the Echo order because the patient wanted it to be done in March at [facility] using someone by the name of Aurea Graff. Patient states she mentioned this during the office visit. If we can get new Echo order for [facility].

## 2022-08-05 NOTE — Telephone Encounter (Signed)
We can certainly change the order.  She does prefer a female sonographer.  Will need to specify female sonographer on the comments.

## 2022-08-24 ENCOUNTER — Other Ambulatory Visit: Payer: Self-pay | Admitting: Cardiovascular Disease

## 2022-08-28 ENCOUNTER — Other Ambulatory Visit: Payer: Self-pay | Admitting: Cardiovascular Disease

## 2022-09-16 ENCOUNTER — Other Ambulatory Visit: Payer: Self-pay | Admitting: Cardiovascular Disease

## 2022-09-18 ENCOUNTER — Encounter: Payer: Self-pay | Admitting: Internal Medicine

## 2022-09-18 ENCOUNTER — Ambulatory Visit: Payer: Medicare PPO | Admitting: Internal Medicine

## 2022-09-18 VITALS — BP 110/76 | HR 68 | Ht 63.0 in | Wt 166.6 lb

## 2022-09-18 DIAGNOSIS — E0500 Thyrotoxicosis with diffuse goiter without thyrotoxic crisis or storm: Secondary | ICD-10-CM | POA: Diagnosis not present

## 2022-09-18 LAB — T4, FREE: Free T4: 0.85 ng/dL (ref 0.60–1.60)

## 2022-09-18 LAB — TSH: TSH: 0.12 u[IU]/mL — ABNORMAL LOW (ref 0.35–5.50)

## 2022-09-18 LAB — T3, FREE: T3, Free: 3.3 pg/mL (ref 2.3–4.2)

## 2022-09-18 NOTE — Progress Notes (Signed)
Patient ID: Doris [name], female   DOB: 1945-11-14, 77 y.o.   MRN: [id number]  HPI  Doris [name] is a 77 y.o.-year-old very pleasant female, initially referred by her PCP, Alvera Singh, NP (Dr Jene Every [facility]), presenting for follow-up for Graves ds. Last visit 1 year ago.  Interim history: No tremors, palpitations, unintentional weight loss (she gained 5 lbs since last OV), heat intolerance. She has pulmonary hypertension for which she sees cardiology.  No increased shortness of breath. She and her husband are taking care of a friend that is 42 years old.  They have been eating out more in the last year.  Reviewed and addended history: She had a gastroenteritis on 10/25/2014 >> felt weak after this. She presented to [facility] for the weakness - TSH found low mid-March. She then had the TSH rechecked at a visit with PCP: TSH still low.   She had weakness in the past with her thyrotoxic episodes, including winter 2016 >> TFTs abnormal >> restarted MMI 5 mg daily in 10/2015 >> we were able to decrease the dose to 2.5 mg daily (5 mg qod) in 12/2015.   12/2017: TFTs were normal on 2.5 mg of methimazole daily.  01/2019: We stopped methimazole after a TSH returned slightly elevated  TFTs were normal afterwards until 08/2020. She has chronic sinusitis with frequent exacerbations.   A TSH was low x2 in 08/2020.  We started back on methimazole 2.5 mg daily (she takes 5 mg every other day).  However, in 03/2022, she came off methimazole again.  Reviewed patient's TFTs: Lab Results  Component Value Date   TSH 0.72 04/23/2022   TSH 5.08 03/18/2022   TSH 4.62 09/18/2021   TSH 3.53 03/14/2021   TSH 1.49 10/22/2020   FREET4 0.82 04/23/2022   FREET4 0.69 03/18/2022   FREET4 0.74 09/18/2021   FREET4 0.79 03/14/2021   FREET4 0.71 10/22/2020  09/07/2020: TSH 0.173 (0.6-3.3) 04/06/2019: TSH 2.14, free T4 1.18, free T3 2.95, all normal 02/10/2019: TSH 6.6 11/21/2014: TSH 0.229  (0.450-4.5), TT4 11.9 (4.5-12)  11/04/2014: TSH 0.326 02/2014: TSH 2.530  Her TSI level was normal at last check: Lab Results  Component Value Date   TSI 108 01/11/2018    05/29/2015: Thyroid uptake and scan: The 24 hour radioactive iodine uptake is equal to 40.9%. There is uniform tracer uptake throughout both lobes. No dominant hot or cold nodule identified. IMPRESSION: 1. Elevated 24 hour radioactive uptake equal 40.9%. 2. Patient's hyperthyroidism may be due to Graves disease.  She saw Dr. Fletcher Anon in the past for palpitations.  She was diagnosed with atrial fibrillation.  On Eliquis and Lopressor.  She also has pulmonary hypertension.  Pt denies: - feeling nodules in neck - hoarseness - dysphagia - choking  + Family history of thyroid cancer in daughter. No h/o radiation tx to head or neck. No recent contrast studies. No herbal supplements. No Biotin use. No recent steroids use.   She has 2 daughters (one in [place] and one near [place]).  ROS: + see HPI  I reviewed pt's medications, allergies, PMH, social hx, family hx, and changes were documented in the history of present illness. Otherwise, unchanged from my initial visit note.  Past Medical History:  Diagnosis Date   Graves disease    Hyperlipidemia    Hypertension    Hyperthyroidism    PAF (paroxysmal atrial fibrillation) ([place])    a. Dx 03/2019 -->AF RVR @ [facility]; b. 04/2019 Zio: <1% Afib  burden; c. CHA2DS2VASc = 3-->Eliquis.   PAH (pulmonary artery hypertension) ([facility])    a. 05/2017 Echo: EF 60-65%, PASP ; b. 04/2019 Echo: EF 60-65% PASP ; c. 10/2021 Echo: EF 60-65%, no rwma, RVSP 42.15mmHg. Mild MR. Mod TR.   Palpitations    a. 05/2017 Echo: EF 60-65%; b. 05/2017 48h Holter: Rare PACs and PVCs. 11 beat run of SVT w/ aberrancy; c. 04/2019 Zio: Avg HR 56. 37 episodes of SVT, longest 19. Intermittent AF (<1% burden, longest 1:39). Rare PVCs.   Positive TB test    Valvular heart disease    a. 10/2021 Echo: Mild  MR, Mod TR.   Past Surgical History:  Procedure Laterality Date   CESAREAN SECTION     x 2   EXCISION METACARPAL MASS Right 12/17/2017   Procedure: EXCISION METACARPAL MASS;  Surgeon: Kennedy Bucker, MD;  Location: [facility] ORS;  Service: Orthopedics;  Laterality: Right;    History   Social History   Marital Status: Married    Spouse Name: N/A   Number of Children: 5   Occupational History   Retired Runner, broadcasting/film/video   Social History Main Topics   Smoking status: Never Smoker    Smokeless tobacco: Not on file   Alcohol Use: No   Drug Use: No   Current Outpatient Medications on File Prior to Visit  Medication Sig Dispense Refill   acetaminophen (TYLENOL) 325 MG tablet Take 325 mg by mouth every 6 (six) hours as needed for headache.      amLODipine (NORVASC) 5 MG tablet TAKE 1 TABLET (5 MG TOTAL) BY MOUTH DAILY. 90 tablet 0   Carboxymethylcellul-Glycerin (LUBRICATING EYE DROPS OP) Place 2 drops into both eyes daily as needed (dry eyes).     Cholecalciferol (VITAMIN D3) 25 MCG (1000 UT) CAPS Take by mouth. Take 1 capsule (25 mcg total) by mouth daily.     ELIQUIS 5 MG TABS tablet TAKE 1 TABLET BY MOUTH TWICE A DAY 180 tablet 1   losartan (COZAAR) 100 MG tablet TAKE 1 TABLET BY MOUTH EVERY DAY 90 tablet 0   methimazole (TAPAZOLE) 5 MG tablet TAKE 1/2 TABLET BY MOUTH DAILY (Patient not taking: Reported on 06/30/2022) 45 tablet 3   metoprolol tartrate (LOPRESSOR) 25 MG tablet TAKE 1/2 TABLET(12.5 MG) BY MOUTH TWICE DAILY 90 tablet 0   Multiple Vitamin (MULTIVITAMIN WITH MINERALS) TABS tablet Take 1 tablet by mouth daily.     rosuvastatin (CRESTOR) 5 MG tablet Take 5 mg by mouth at bedtime.      spironolactone (ALDACTONE) 25 MG tablet TAKE 1/2 TABLET(12.5 MG) BY MOUTH DAILY 45 tablet 0   No current facility-administered medications on file prior to visit.   No Known Allergies   FH: - HTN in M - HL in M, sister - also see HPI  PE: BP 110/76 (BP Location: Left Arm, Patient Position: Sitting,  Cuff Size: Normal)   Pulse 68   Ht 5\' 3"  (1.6 m)   Wt 166 lb 9.6 oz (75.6 kg)   SpO2 98%   BMI 29.51 kg/m   Wt Readings from Last 3 Encounters:  09/18/22 166 lb 9.6 oz (75.6 kg)  06/30/22 164 lb (74.4 kg)  02/24/22 162 lb 3.2 oz (73.6 kg)   Constitutional: normal weight, in NAD Eyes:  EOMI, no exophthalmos ENT: no neck masses, no cervical lymphadenopathy Cardiovascular: RRR, No MRG Respiratory: CTA B Musculoskeletal: no deformities Skin:no rashes Neurological: no tremor with outstretched hands  ASSESSMENT: 1. Graves ds.   PLAN:  1.  -Patient with history of Graves' disease with improved TFTs on methimazole, which we were able to decrease and then stop completely in 01/2019.  TFTs remained controlled for 1.5 years but then we had to restart methimazole as TSH dropped lower than the lower limit of the normal range (with still normal free thyroid hormones).  At last visit, she was on 2.5 mg daily, low-dose methimazole, with good tolerance.  Her TSH was close to the upper limit of the target range at that time.  However, in 03/2022, she stopped methimazole and we repeated her TFTs on 04/23/2022 with normal results.  We continued off methimazole. -At today's visit, she denies hyperthyroid symptoms: She has no hot flashes, palpitations, unintentional weight loss, anxiety, insomnia, tremors, hyperdefecation.  If, she gained 5 pounds since last visit, which she attributes to eating out more in the last year.  She is planning to reduce eating out. -She continues on beta-blocker: Lopressor 12.5 mg twice a day - per cardiology.  No tachycardia. -At today's visit, we will recheck her TFTs and see if we need to restart methimazole -She prefers to be called with results, rather than joining MyChart -I will see her in 1 year, but in 6 months for labs  Needs refills of MMI if we need to restart the medication.  Component     Latest Ref Rng 09/18/2022  T4,Free(Direct)     0.60 - 1.60 ng/dL 0.85    TSH     0.35 - 5.50 uIU/mL 0.12 (L)   Triiodothyronine,Free,Serum     2.3 - 4.2 pg/mL 3.3     TSH has decreased, now under the lower limit of the target range.  Free thyroid hormone is normal.  I will suggest to restart methimazole 2.5 mg daily.  We need to repeat the TFTs in 4 to 5 weeks.  I refilled her methimazole.  Philemon Kingdom, MD PhD [facility]

## 2022-09-18 NOTE — Patient Instructions (Signed)
Please continue off methimazole.  Please stop at the lab.  Please come back for a follow-up appointment in 1 year but for labs in 6 months.

## 2022-09-19 MED ORDER — METHIMAZOLE 5 MG PO TABS: 2.5000 mg | ORAL_TABLET | Freq: Every day | ORAL | 3 refills | Status: DC

## 2022-09-25 ENCOUNTER — Telehealth: Payer: Self-pay | Admitting: Pulmonary Disease

## 2022-09-25 NOTE — Telephone Encounter (Signed)
It was meant to be 10/2022, 1 year from last echo.  She also needs to have a female sonographer.

## 2022-09-25 NOTE — Telephone Encounter (Signed)
I have spoke with Doris [name] and she is aware that March was correct to schedule her Echo which is scheduled on 11/20/22

## 2022-09-25 NOTE — Telephone Encounter (Signed)
Dr. Patsey Berthold saw the patient on 06/30/22 placed order for echo to be done in 6 months. The patient called into the office today to schedule the echo and thought she remembered hearing 10/2022 to schedule the echo which would be 1 year from last echo.  Which time frame did the echo need to be done?

## 2022-10-23 ENCOUNTER — Other Ambulatory Visit (INDEPENDENT_AMBULATORY_CARE_PROVIDER_SITE_OTHER): Payer: Medicare PPO

## 2022-10-23 DIAGNOSIS — E0500 Thyrotoxicosis with diffuse goiter without thyrotoxic crisis or storm: Secondary | ICD-10-CM | POA: Diagnosis not present

## 2022-10-23 LAB — T3, FREE: T3, Free: 2.7 pg/mL (ref 2.3–4.2)

## 2022-10-23 LAB — T4, FREE: Free T4: 0.6 ng/dL (ref 0.60–1.60)

## 2022-10-23 LAB — TSH: TSH: 1.05 u[IU]/mL (ref 0.35–5.50)

## 2022-10-24 ENCOUNTER — Other Ambulatory Visit: Payer: Medicare PPO

## 2022-10-28 ENCOUNTER — Other Ambulatory Visit: Payer: Medicare PPO

## 2022-11-13 ENCOUNTER — Telehealth: Payer: Self-pay | Admitting: Pulmonary Disease

## 2022-11-13 NOTE — Telephone Encounter (Signed)
I have spoke with Doris [name] and she needed to reschedule her Echo appt from 11/20/22 to the middle of April.  Doris [name] Echo has been rescheduled for 12/19/22 @ 11:00am at [place] [place] and she is aware of the appt change

## 2022-11-13 NOTE — Telephone Encounter (Signed)
Patient would like you to call her back.

## 2022-11-19 ENCOUNTER — Other Ambulatory Visit: Payer: Self-pay | Admitting: Cardiovascular Disease

## 2022-11-20 ENCOUNTER — Ambulatory Visit: Payer: Medicare PPO

## 2022-11-25 ENCOUNTER — Other Ambulatory Visit: Payer: Self-pay | Admitting: Cardiovascular Disease

## 2022-11-25 NOTE — Telephone Encounter (Signed)
Please schedule F/U appointment for 90 day refills. Thank you! 

## 2022-11-27 ENCOUNTER — Telehealth: Payer: Self-pay | Admitting: Cardiovascular Disease

## 2022-11-27 MED ORDER — SPIRONOLACTONE 25 MG PO TABS: 12.5000 mg | ORAL_TABLET | Freq: Every day | ORAL | 0 refills | Status: DC

## 2022-11-27 NOTE — Telephone Encounter (Signed)
*  STAT* If patient is at the pharmacy, call can be transferred to refill team.   1. Which medications need to be refilled? (please list name of each medication and dose if known)   spironolactone (ALDACTONE) 25 MG tablet    2. Which pharmacy/location (including street and city if local pharmacy) is medication to be sent to?  [id number] - [place]    3. Do they need a 30 day or 90 day supply? 90 day

## 2022-11-27 NOTE — Telephone Encounter (Signed)
Requested Prescriptions   Signed Prescriptions Disp Refills   spironolactone (ALDACTONE) 25 MG tablet 45 tablet 0    Sig: Take 0.5 tablets (12.5 mg total) by mouth daily.    Authorizing Provider: Kathlyn Sacramento A    Ordering User: Britt Bottom

## 2022-12-02 ENCOUNTER — Other Ambulatory Visit: Payer: Self-pay | Admitting: Cardiovascular Disease

## 2022-12-14 ENCOUNTER — Other Ambulatory Visit: Payer: Self-pay | Admitting: Cardiovascular Disease

## 2022-12-15 ENCOUNTER — Telehealth: Payer: Self-pay | Admitting: Cardiovascular Disease

## 2022-12-15 ENCOUNTER — Other Ambulatory Visit: Payer: Self-pay

## 2022-12-15 ENCOUNTER — Other Ambulatory Visit: Payer: Self-pay | Admitting: Cardiovascular Disease

## 2022-12-15 MED ORDER — AMLODIPINE BESYLATE 5 MG PO TABS: 5.0000 mg | ORAL_TABLET | Freq: Every day | ORAL | 3 refills | Status: DC

## 2022-12-15 NOTE — Telephone Encounter (Signed)
*  STAT* If patient is at the pharmacy, call can be transferred to refill team.   1. Which medications need to be refilled? (please list name of each medication and dose if known)  amLODipine (NORVASC) 5 MG tablet   2. Which pharmacy/location (including street and city if local pharmacy) is medication to be sent to?  [id number] - [place], [place] - [address]    3. Do they need a 30 day or 90 day supply? 90 day  Patient has appt with Dr. Kirke Corin already schedule for 03/06/23

## 2022-12-19 ENCOUNTER — Ambulatory Visit
Admission: RE | Admit: 2022-12-19 | Discharge: 2022-12-19 | Disposition: A | Discharge status: Home / Self Care | Payer: Medicare PPO | Source: Ambulatory Visit | Attending: Pulmonary Disease | Admitting: Pulmonary Disease

## 2022-12-19 DIAGNOSIS — I4891 Unspecified atrial fibrillation: Secondary | ICD-10-CM | POA: Insufficient documentation

## 2022-12-19 DIAGNOSIS — I083 Combined rheumatic disorders of mitral, aortic and tricuspid valves: Secondary | ICD-10-CM | POA: Insufficient documentation

## 2022-12-19 DIAGNOSIS — I272 Pulmonary hypertension, unspecified: Secondary | ICD-10-CM | POA: Insufficient documentation

## 2022-12-19 DIAGNOSIS — I10 Essential (primary) hypertension: Secondary | ICD-10-CM | POA: Diagnosis not present

## 2022-12-19 DIAGNOSIS — E785 Hyperlipidemia, unspecified: Secondary | ICD-10-CM | POA: Insufficient documentation

## 2022-12-19 LAB — ECHOCARDIOGRAM COMPLETE
AR max vel: 2.51 cm2
AV Area VTI: 2.64 cm2
AV Area mean vel: 2.77 cm2
AV Mean grad: 3 mmHg
AV Peak grad: 7.3 mmHg
Ao pk vel: 1.35 m/s
Area-P 1/2: 3.21 cm2
MV VTI: 2.14 cm2
S' Lateral: 2.4 cm

## 2022-12-19 NOTE — Progress Notes (Signed)
*  PRELIMINARY RESULTS* Echocardiogram 2D Echocardiogram has been performed.  Carolyne Fiscal 12/19/2022, 12:21 PM

## 2022-12-22 ENCOUNTER — Other Ambulatory Visit: Payer: Self-pay | Admitting: Cardiovascular Disease

## 2022-12-22 DIAGNOSIS — I480 Paroxysmal atrial fibrillation: Secondary | ICD-10-CM

## 2022-12-22 NOTE — Telephone Encounter (Signed)
Prescription refill request for Eliquis received. Indication: a fib Last office visit: 11/22/21, overdue. Has appt scheduled Scr: 0.9 11/22/22 care everywhere Age: 77 Weight: 75kg

## 2022-12-30 ENCOUNTER — Ambulatory Visit: Payer: Medicare PPO | Admitting: Cardiovascular Disease

## 2023-01-15 ENCOUNTER — Ambulatory Visit (INDEPENDENT_AMBULATORY_CARE_PROVIDER_SITE_OTHER): Payer: Medicare PPO | Admitting: Adult Health

## 2023-01-15 ENCOUNTER — Encounter: Payer: Self-pay | Admitting: Adult Health

## 2023-01-15 VITALS — BP 132/80 | HR 67 | Temp 98.1°F | Ht 63.0 in | Wt 161.8 lb

## 2023-01-15 DIAGNOSIS — I272 Pulmonary hypertension, unspecified: Secondary | ICD-10-CM | POA: Diagnosis not present

## 2023-01-15 DIAGNOSIS — J9611 Chronic respiratory failure with hypoxia: Secondary | ICD-10-CM | POA: Diagnosis not present

## 2023-01-15 NOTE — Progress Notes (Signed)
@Patient  ID: Doris [name], female    DOB: 1946/01/02, 77 y.o.   MRN: [id number]  Chief Complaint  Patient presents with   Follow-up    Referring provider: Tacey Ruiz, FNP  HPI: 77 year old female never smoker followed for mild pulmonary hypertension with nocturnal hypoxemia on oxygen Medical history significant for graves disease and diastolic dysfunction, A-fib  TEST/EVENTS :  echocardiogram performed on 18 November 2021 that showed that her diastolic parameters were normal, mild elevation of the pulmonary artery systolic pressure, mild mitral regurgitation yet, moderate TR.  Overnight oximetry test July 14, 2022 showed desaturations at 85% on room air.  Oxygen was continued  01/15/2023 Follow up : O2 RF  Patient returns for a 23-month follow-up.  Patient is followed for mild pulmonary hypertension with nocturnal hypoxemia on oxygen at bedtime.  Patient says since last visit she is doing okay.  She says that oxygen is helping quite a bit.  She wears it every single night.  She did have an overnight oximetry test in November that showed ongoing desaturations at 85% on room air.  Her oxygen was continued at 1.5 L/min.  2D echo December 19, 2022 showed EF at 55 to 60%, grade 1 diastolic dysfunction, right ventricular systolic function is normal, RV size mildly enlarged and mildly elevated pulmonary artery systolic pressure at 44.7 mmHg, moderate tricuspid valve regurg, no aortic valve stenosis. Patient says she remains fairly active.  She denies any significant shortness of breath.  No cough or wheezing. Previous evaluation for possible underlying sleep apnea was unrevealing.  Patient has no snoring, no significant restless sleep or daytime sleepiness.  Previous Epworth was very low.  We discussed ongoing evaluation such as sleep study but patient says she is doing well and declines at this time.  She denies any orthopnea, ankle edema, hemoptysis, chest pain.  Remains on Eliquis and  endorses compliance.  She continues to follow with cardiology for A-fib and diastolic dysfunction    No Known Allergies  Immunization History  Administered Date(s) Administered   Fluad Quad(high Dose 65+) 05/16/2020, 05/19/2022   Influenza Inj Mdck Quad Pf 05/16/2020   Influenza, Seasonal, Injecte, Preservative Fre 08/06/2011, 06/24/2012   Influenza,inj,Quad PF,6+ Mos 06/28/2014, 06/07/2015, 06/10/2016, 05/21/2017, 06/07/2018, 05/05/2019, 06/14/2021   Moderna Sars-Cov-2 Peds vaccine 3yrs thru 68yrs 01/07/2021   Moderna Sars-Covid-2 Vaccination 09/24/2019, 10/22/2019, 06/18/2020, 01/07/2021   Pneumococcal Conjugate-13 05/15/2015   Pneumococcal Polysaccharide-23 01/26/2014   Pneumococcal-Unspecified 05/15/2015   Td 10/02/2010   Tdap 10/02/2010    Past Medical History:  Diagnosis Date   Graves disease    Hyperlipidemia    Hypertension    Hyperthyroidism    PAF (paroxysmal atrial fibrillation) (HCC)    a. Dx 03/2019 -->AF RVR @ [facility]; b. 04/2019 Zio: <1% Afib burden; c. CHA2DS2VASc = 3-->Eliquis.   PAH (pulmonary artery hypertension) (HCC)    a. 05/2017 Echo: EF 60-65%, PASP ; b. 04/2019 Echo: EF 60-65% PASP ; c. 10/2021 Echo: EF 60-65%, no rwma, RVSP 42.71mmHg. Mild MR. Mod TR.   Palpitations    a. 05/2017 Echo: EF 60-65%; b. 05/2017 48h Holter: Rare PACs and PVCs. 11 beat run of SVT w/ aberrancy; c. 04/2019 Zio: Avg HR 56. 37 episodes of SVT, longest 19. Intermittent AF (<1% burden, longest 1:39). Rare PVCs.   Positive TB test    Valvular heart disease    a. 10/2021 Echo: Mild MR, Mod TR.    Tobacco History: Social History   Tobacco Use  Smoking Status Never  Smokeless Tobacco Never   Counseling given: Not Answered   Outpatient Medications Prior to Visit  Medication Sig Dispense Refill   acetaminophen (TYLENOL) 325 MG tablet Take 325 mg by mouth every 6 (six) hours as needed for headache.      amLODipine (NORVASC) 5 MG tablet Take 1 tablet (5 mg total) by mouth  daily. Office visit needed for continued refills 30 tablet 3   apixaban (ELIQUIS) 5 MG TABS tablet TAKE 1 TABLET BY MOUTH TWICE A DAY 180 tablet 0   Carboxymethylcellul-Glycerin (LUBRICATING EYE DROPS OP) Place 2 drops into both eyes daily as needed (dry eyes).     Cholecalciferol (VITAMIN D3) 25 MCG (1000 UT) CAPS Take by mouth. Take 1 capsule (25 mcg total) by mouth daily.     losartan (COZAAR) 100 MG tablet TAKE 1 TABLET BY MOUTH EVERY DAY 90 tablet 0   methimazole (TAPAZOLE) 5 MG tablet Take 0.5 tablets (2.5 mg total) by mouth daily. 45 tablet 3   metoprolol tartrate (LOPRESSOR) 25 MG tablet TAKE 1/2 TABLET(12.5 MG) BY MOUTH TWICE DAILY 90 tablet 0   Multiple Vitamin (MULTIVITAMIN WITH MINERALS) TABS tablet Take 1 tablet by mouth daily.     rosuvastatin (CRESTOR) 5 MG tablet Take 5 mg by mouth at bedtime.      spironolactone (ALDACTONE) 25 MG tablet Take 0.5 tablets (12.5 mg total) by mouth daily. 45 tablet 0   No facility-administered medications prior to visit.     Review of Systems:   Constitutional:   No  weight loss, night sweats,  Fevers, chills, + fatigue, or  lassitude.  HEENT:   No headaches,  Difficulty swallowing,  Tooth/dental problems, or  Sore throat,                No sneezing, itching, ear ache, nasal congestion, post nasal drip,   CV:  No chest pain,  Orthopnea, PND, swelling in lower extremities, anasarca, dizziness, palpitations, syncope.   GI  No heartburn, indigestion, abdominal pain, nausea, vomiting, diarrhea, change in bowel habits, loss of appetite, bloody stools.   Resp: No shortness of breath with exertion or at rest.  No excess mucus, no productive cough,  No non-productive cough,  No coughing up of blood.  No change in color of mucus.  No wheezing.  No chest wall deformity  Skin: no rash or lesions.  GU: no dysuria, change in color of urine, no urgency or frequency.  No flank pain, no hematuria   MS:  No joint pain or swelling.  No decreased range of  motion.  No back pain.    Physical Exam  BP 132/80 (BP Location: Left Arm, Cuff Size: Normal)   Pulse 67   Temp 98.1 F (36.7 C)   Ht 5\' 3"  (1.6 m)   Wt 161 lb 12.8 oz (73.4 kg)   SpO2 99%   BMI 28.66 kg/m   GEN: A/Ox3; pleasant , NAD, well nourished    HEENT:  [place]/AT,   NOSE-clear, THROAT-clear, no lesions, no postnasal drip or exudate noted.   NECK:  Supple w/ fair ROM; no JVD; normal carotid impulses w/o bruits; no thyromegaly or nodules palpated; no lymphadenopathy.    RESP  Clear  P & A; w/o, wheezes/ rales/ or rhonchi. no accessory muscle use, no dullness to percussion  CARD:  RRR, no m/r/g, no peripheral edema, pulses intact, no cyanosis or clubbing.  GI:   Soft & nt; nml bowel sounds; no organomegaly or masses detected.   Musco:  Warm bil, no deformities or joint swelling noted.   Neuro: alert, no focal deficits noted.    Skin: Warm, no lesions or rashes    Lab Results:  CBC     BNP No results found for: "BNP"  ProBNP No results found for: "PROBNP"  Imaging: ECHOCARDIOGRAM COMPLETE  Result Date: 12/19/2022    ECHOCARDIOGRAM REPORT   Patient Name:   SHALEEN LONGDEN Date of Exam: 12/19/2022 Medical Rec #:  [id number]      Height:       63.0 in Accession #:    [id number]     Weight:       166.6 lb Date of Birth:  10-Nov-1945      BSA:          1.789 m Patient Age:    76 years       BP:           110/76 mmHg Patient Gender: F              HR:           64 bpm. Exam Location:  [facility] Procedure: 2D Echo, Cardiac Doppler and Color Doppler Indications:     Pulmonary HTN  History:         Patient has prior history of Echocardiogram examinations, most                  recent 11/18/2021. Pulmonary HTN, Arrythmias:Atrial                  Fibrillation; Risk Factors:Hypertension and Dyslipidemia.  Sonographer:     Mikki Harbor Referring Phys:  [id number] CARMEN L GONZALEZ Diagnosing Phys: Julien Nordmann MD IMPRESSIONS  1. Left ventricular ejection fraction, by estimation, is 55 to  60%. The left ventricle has normal function. The left ventricle has no regional wall motion abnormalities. There is mild left ventricular hypertrophy. Left ventricular diastolic parameters are consistent with Grade I diastolic dysfunction (impaired relaxation).  2. Right ventricular systolic function is normal. The right ventricular size is mildly enlarged. There is mildly elevated pulmonary artery systolic pressure. The estimated right ventricular systolic pressure is 44.7 mmHg.  3. The mitral valve is normal in structure. Mild mitral valve regurgitation. No evidence of mitral stenosis.  4. Tricuspid valve regurgitation is moderate.  5. The aortic valve is tricuspid. Aortic valve regurgitation is not visualized. Aortic valve sclerosis is present, with no evidence of aortic valve stenosis.  6. The inferior vena cava is normal in size with greater than 50% respiratory variability, suggesting right atrial pressure of 3 mmHg. FINDINGS  Left Ventricle: Left ventricular ejection fraction, by estimation, is 55 to 60%. The left ventricle has normal function. The left ventricle has no regional wall motion abnormalities. The left ventricular internal cavity size was normal in size. There is  mild left ventricular hypertrophy. Left ventricular diastolic parameters are consistent with Grade I diastolic dysfunction (impaired relaxation). Right Ventricle: The right ventricular size is mildly enlarged. No increase in right ventricular wall thickness. Right ventricular systolic function is normal. There is mildly elevated pulmonary artery systolic pressure. The tricuspid regurgitant velocity is 3.03 m/s, and with an assumed right atrial pressure of 8 mmHg, the estimated right ventricular systolic pressure is 44.7 mmHg. Left Atrium: Left atrial size was normal in size. Right Atrium: Right atrial size was normal in size. Pericardium: There is no evidence of pericardial effusion. Mitral Valve: The mitral valve is normal in structure.  Mild mitral valve regurgitation. No  evidence of mitral valve stenosis. MV peak gradient, 7.3 mmHg. The mean mitral valve gradient is 2.0 mmHg. Tricuspid Valve: The tricuspid valve is normal in structure. Tricuspid valve regurgitation is moderate . No evidence of tricuspid stenosis. Aortic Valve: The aortic valve is tricuspid. Aortic valve regurgitation is not visualized. Aortic valve sclerosis is present, with no evidence of aortic valve stenosis. Aortic valve mean gradient measures 3.0 mmHg. Aortic valve peak gradient measures 7.3  mmHg. Aortic valve area, by VTI measures 2.64 cm. Pulmonic Valve: The pulmonic valve was normal in structure. Pulmonic valve regurgitation is mild. No evidence of pulmonic stenosis. Aorta: The aortic root is normal in size and structure. Venous: The inferior vena cava is normal in size with greater than 50% respiratory variability, suggesting right atrial pressure of 3 mmHg. IAS/Shunts: No atrial level shunt detected by color flow Doppler.  LEFT VENTRICLE PLAX 2D LVIDd:         4.70 cm   Diastology LVIDs:         2.40 cm   LV e' medial:    9.03 cm/s LV PW:         1.00 cm   LV E/e' medial:  10.2 LV IVS:        1.10 cm   LV e' lateral:   10.40 cm/s LVOT diam:     2.00 cm   LV E/e' lateral: 8.8 LV SV:         90 LV SV Index:   51 LVOT Area:     3.14 cm  RIGHT VENTRICLE RV Basal diam:  3.10 cm RV Mid diam:    3.40 cm RV S prime:     15.00 cm/s LEFT ATRIUM             Index        RIGHT ATRIUM           Index LA diam:        4.30 cm 2.40 cm/m   RA Area:     15.80 cm LA Vol (A2C):   53.1 ml 29.68 ml/m  RA Volume:   38.90 ml  21.74 ml/m LA Vol (A4C):   39.2 ml 21.91 ml/m LA Biplane Vol: 47.1 ml 26.33 ml/m  AORTIC VALVE                    PULMONIC VALVE AV Area (Vmax):    2.51 cm     PV Vmax:       1.16 m/s AV Area (Vmean):   2.77 cm     PV Peak grad:  5.4 mmHg AV Area (VTI):     2.64 cm AV Vmax:           135.00 cm/s AV Vmean:          82.000 cm/s AV VTI:            0.343 m AV Peak  Grad:      7.3 mmHg AV Mean Grad:      3.0 mmHg LVOT Vmax:         108.00 cm/s LVOT Vmean:        72.300 cm/s LVOT VTI:          0.288 m LVOT/AV VTI ratio: 0.84  AORTA Ao Root diam: 3.10 cm MITRAL VALVE                TRICUSPID VALVE MV Area (PHT): 3.21 cm     TR Peak grad:   36.7  mmHg MV Area VTI:   2.14 cm     TR Vmax:        303.00 cm/s MV Peak grad:  7.3 mmHg MV Mean grad:  2.0 mmHg     SHUNTS MV Vmax:       1.35 m/s     Systemic VTI:  0.29 m MV Vmean:      63.0 cm/s    Systemic Diam: 2.00 cm MV Decel Time: 236 msec MV E velocity: 91.70 cm/s MV A velocity: 115.00 cm/s MV E/A ratio:  0.80 Julien Nordmann MD Electronically signed by Julien Nordmann MD Signature Date/Time: 12/19/2022/1:34:14 PM    Final           No data to display          No results found for: "NITRICOXIDE"      Assessment & Plan:   Chronic respiratory failure with hypoxia (HCC) Chronic respiratory failure on nocturnal oxygen.  Patient is doing well and has perceived benefit.  Continue on O2 at 1.5 L nocturnally  Plan  Patient Instructions  Continue on oxygen at bedtime Activity as tolerated Follow up with Dr. Jayme Cloud in 6 months and As needed      Pulmonary hypertension (HCC) Mild pulmonary hypertension.  Appears stable on recent echo.  Suspect is multifactorial with underlying moderate tricuspid regurg, diastolic dysfunction.  Could consider sleep study but has minimum symptoms and low Epworth score.  Patient declines at this time. No sign of volume overload on exam.  Continue on oxygen at bedtime     Rubye Oaks, NP 01/15/2023

## 2023-01-15 NOTE — Patient Instructions (Addendum)
Continue on oxygen at bedtime Activity as tolerated Follow up with Dr. Jayme Cloud in 6 months and As needed

## 2023-01-15 NOTE — Assessment & Plan Note (Signed)
Mild pulmonary hypertension.  Appears stable on recent echo.  Suspect is multifactorial with underlying moderate tricuspid regurg, diastolic dysfunction.  Could consider sleep study but has minimum symptoms and low Epworth score.  Patient declines at this time. No sign of volume overload on exam.  Continue on oxygen at bedtime

## 2023-01-15 NOTE — Progress Notes (Signed)
Agree with the details of the visit as noted by Tammy Parrett, NP.  C. Laura Abriel Hattery, MD [facility] 

## 2023-01-15 NOTE — Assessment & Plan Note (Signed)
Chronic respiratory failure on nocturnal oxygen.  Patient is doing well and has perceived benefit.  Continue on O2 at 1.5 L nocturnally  Plan  Patient Instructions  Continue on oxygen at bedtime Activity as tolerated Follow up with Dr. Jayme Cloud in 6 months and As needed

## 2023-02-02 ENCOUNTER — Other Ambulatory Visit: Payer: Self-pay | Admitting: Family Medicine

## 2023-02-02 DIAGNOSIS — Z1231 Encounter for screening mammogram for malignant neoplasm of breast: Secondary | ICD-10-CM

## 2023-02-09 ENCOUNTER — Other Ambulatory Visit: Payer: Self-pay | Admitting: Cardiovascular Disease

## 2023-02-09 DIAGNOSIS — I480 Paroxysmal atrial fibrillation: Secondary | ICD-10-CM

## 2023-02-09 NOTE — Telephone Encounter (Signed)
Refill request

## 2023-02-09 NOTE — Telephone Encounter (Signed)
Prescription refill request for Eliquis received. Indication:afib Last office visit:upcoming Scr:0.90  3/24 Age: 77 Weight:73.4  kg  Prescription refilled

## 2023-02-11 ENCOUNTER — Telehealth: Payer: Self-pay | Admitting: Cardiovascular Disease

## 2023-02-11 ENCOUNTER — Ambulatory Visit
Admission: RE | Admit: 2023-02-11 | Discharge: 2023-02-11 | Disposition: A | Discharge status: Home / Self Care | Payer: Medicare PPO | Source: Ambulatory Visit | Attending: Family Medicine | Admitting: Family Medicine

## 2023-02-11 DIAGNOSIS — Z1231 Encounter for screening mammogram for malignant neoplasm of breast: Secondary | ICD-10-CM | POA: Insufficient documentation

## 2023-02-11 MED ORDER — SPIRONOLACTONE 25 MG PO TABS: 12.5000 mg | ORAL_TABLET | Freq: Every day | ORAL | 0 refills | Status: DC

## 2023-02-11 NOTE — Telephone Encounter (Signed)
Refill has been sent in with instructions to call for appt for further refills.

## 2023-02-11 NOTE — Telephone Encounter (Signed)
*  STAT* If patient is at the pharmacy, call can be transferred to refill team.   1. Which medications need to be refilled? (please list name of each medication and dose if known)   spironolactone (ALDACTONE) 25 MG tablet    2. Which pharmacy/location (including street and city if local pharmacy) is medication to be sent to?  [id number] - [place], [place] - [address]    3. Do they need a 30 day or 90 day supply? 90 day  

## 2023-03-01 ENCOUNTER — Other Ambulatory Visit: Payer: Self-pay | Admitting: Cardiovascular Disease

## 2023-03-06 ENCOUNTER — Ambulatory Visit: Payer: Medicare PPO | Admitting: Cardiovascular Disease

## 2023-03-13 ENCOUNTER — Other Ambulatory Visit: Payer: Self-pay | Admitting: Cardiovascular Disease

## 2023-03-18 ENCOUNTER — Other Ambulatory Visit: Payer: Self-pay

## 2023-03-18 DIAGNOSIS — E0500 Thyrotoxicosis with diffuse goiter without thyrotoxic crisis or storm: Secondary | ICD-10-CM

## 2023-03-19 ENCOUNTER — Other Ambulatory Visit (INDEPENDENT_AMBULATORY_CARE_PROVIDER_SITE_OTHER): Payer: Medicare PPO

## 2023-03-19 DIAGNOSIS — E0500 Thyrotoxicosis with diffuse goiter without thyrotoxic crisis or storm: Secondary | ICD-10-CM

## 2023-03-19 LAB — T3, FREE: T3, Free: 2.7 pg/mL (ref 2.3–4.2)

## 2023-03-19 LAB — TSH: TSH: 2.78 u[IU]/mL (ref 0.35–5.50)

## 2023-03-19 LAB — T4, FREE: Free T4: 0.69 ng/dL (ref 0.60–1.60)

## 2023-03-24 ENCOUNTER — Other Ambulatory Visit: Payer: Self-pay | Admitting: Cardiovascular Disease

## 2023-04-15 ENCOUNTER — Telehealth: Payer: Self-pay | Admitting: Internal Medicine

## 2023-04-15 NOTE — Telephone Encounter (Signed)
Patient is asking for clinical staff to call her she has some questions about her medication

## 2023-04-16 NOTE — Telephone Encounter (Signed)
Patient has been notified and voices understanding.

## 2023-04-16 NOTE — Telephone Encounter (Signed)
I reviewed her most recent thyroid test from the end of July and they were excellent.  It does not appear that the hair loss is related to the thyroid.

## 2023-04-16 NOTE — Telephone Encounter (Signed)
Patient called back and states that she went to see her PCP back in July and they asked her has she been having any hair loss and she answered yes, So her PCP advised her to call here because she states that it could be coming from her Thyroid medication. Patient states that its not a lot of hair but she does notice that some comes out when she combs her hair. Please advise

## 2023-04-16 NOTE — Telephone Encounter (Signed)
LMTRC  JMiller,RMA 

## 2023-04-18 ENCOUNTER — Other Ambulatory Visit: Payer: Self-pay | Admitting: Cardiovascular Disease

## 2023-04-21 ENCOUNTER — Ambulatory Visit: Payer: Medicare PPO | Attending: Cardiovascular Disease | Admitting: Cardiovascular Disease

## 2023-04-21 ENCOUNTER — Encounter: Payer: Self-pay | Admitting: Cardiovascular Disease

## 2023-04-21 VITALS — BP 130/68 | HR 61 | Ht 63.0 in | Wt 164.1 lb

## 2023-04-21 DIAGNOSIS — I480 Paroxysmal atrial fibrillation: Secondary | ICD-10-CM | POA: Diagnosis not present

## 2023-04-21 DIAGNOSIS — E785 Hyperlipidemia, unspecified: Secondary | ICD-10-CM

## 2023-04-21 DIAGNOSIS — I10 Essential (primary) hypertension: Secondary | ICD-10-CM

## 2023-04-21 MED ORDER — AMLODIPINE BESYLATE 5 MG PO TABS: 5.0000 mg | ORAL_TABLET | Freq: Every day | ORAL | 3 refills | Status: DC

## 2023-04-21 MED ORDER — METOPROLOL TARTRATE 25 MG PO TABS: ORAL_TABLET | ORAL | 3 refills | Status: DC

## 2023-04-21 MED ORDER — LOSARTAN POTASSIUM 100 MG PO TABS: 100.0000 mg | ORAL_TABLET | Freq: Every day | ORAL | 3 refills | Status: DC

## 2023-04-21 MED ORDER — SPIRONOLACTONE 25 MG PO TABS: 12.5000 mg | ORAL_TABLET | Freq: Every day | ORAL | 3 refills | Status: DC

## 2023-04-21 NOTE — Progress Notes (Signed)
Cardiology Office Note   Date:  04/21/2023   ID:  Tasneem, Grief [birth year], MRN [id number]  PCP:  Tacey Ruiz, FNP  Cardiologist:   Lorine Bears, MD   Chief Complaint  Patient presents with   1 year follow up     "Doing well." Medications reviewed by the patient verbally.       History of Present Illness: Doris [name] is a 77 y.o. female who presents for  a followup visit regarding paroxysmal atrial fibrillation, mild pulmonary hypertension and essential hypertension.  She has known history of essential hypertension, hyperlipidemia and hyperthyroidism. She was diagnosed with atrial fibrillation in August 2020 after she presented to [facility] ED with palpitations.  Her heart rate was 169 bpm.  She was discharged on small dose metoprolol and Eliquis and has done well since then.   Echocardiogram in September 2020 showed an EF of 60 to 65% with mild pulmonary hypertension.  She had hyponatremia with hydrochlorothiazide and was subsequently switched to spironolactone. She had a repeat echocardiogram done in February of 2022 which showed normal LV systolic function, normal diastolic function and improvement in pulmonary pressure with estimated systolic pulmonary pressure of 36 mmHg.  She has not been seen in our office in more than 1 year.  She denies chest pain, shortness of breath or palpitations.  She had an echocardiogram in April of this year which showed normal LV systolic function, mild pulmonary hypertension with estimated systolic pressure of 44.7 mmHg and moderate tricuspid regurgitation.  Past Medical History:  Diagnosis Date   Graves disease    Hyperlipidemia    Hypertension    Hyperthyroidism    PAF (paroxysmal atrial fibrillation) (HCC)    a. Dx 03/2019 -->AF RVR @ [facility]; b. 04/2019 Zio: <1% Afib burden; c. CHA2DS2VASc = 3-->Eliquis.   PAH (pulmonary artery hypertension) (HCC)    a. 05/2017 Echo: EF 60-65%, PASP ; b. 04/2019 Echo: EF 60-65% PASP ; c.  10/2021 Echo: EF 60-65%, no rwma, RVSP 42.54mmHg. Mild MR. Mod TR.   Palpitations    a. 05/2017 Echo: EF 60-65%; b. 05/2017 48h Holter: Rare PACs and PVCs. 11 beat run of SVT w/ aberrancy; c. 04/2019 Zio: Avg HR 56. 37 episodes of SVT, longest 19. Intermittent AF (<1% burden, longest 1:39). Rare PVCs.   Positive TB test    Valvular heart disease    a. 10/2021 Echo: Mild MR, Mod TR.    Past Surgical History:  Procedure Laterality Date   CESAREAN SECTION     x 2   EXCISION METACARPAL MASS Right 12/17/2017   Procedure: EXCISION METACARPAL MASS;  Surgeon: Kennedy Bucker, MD;  Location: [facility] ORS;  Service: Orthopedics;  Laterality: Right;     Current Outpatient Medications  Medication Sig Dispense Refill   acetaminophen (TYLENOL) 325 MG tablet Take 325 mg by mouth every 6 (six) hours as needed for headache.      amLODipine (NORVASC) 5 MG tablet Take 1 tablet (5 mg total) by mouth daily. 30 tablet 0   apixaban (ELIQUIS) 5 MG TABS tablet TAKE 1 TABLET BY MOUTH TWICE A DAY 180 tablet 1   Carboxymethylcellul-Glycerin (LUBRICATING EYE DROPS OP) Place 2 drops into both eyes daily as needed (dry eyes).     Cholecalciferol (VITAMIN D3) 25 MCG (1000 UT) CAPS Take by mouth. Take 1 capsule (25 mcg total) by mouth daily.     losartan (COZAAR) 100 MG tablet TAKE 1 TABLET BY MOUTH EVERY DAY 90 tablet 0  methimazole (TAPAZOLE) 5 MG tablet Take 0.5 tablets (2.5 mg total) by mouth daily. 45 tablet 3   metoprolol tartrate (LOPRESSOR) 25 MG tablet TAKE 1/2 TABLET(12.5 MG) BY MOUTH TWICE DAILY 90 tablet 0   Multiple Vitamin (MULTIVITAMIN WITH MINERALS) TABS tablet Take 1 tablet by mouth daily.     rosuvastatin (CRESTOR) 5 MG tablet Take 5 mg by mouth at bedtime.      spironolactone (ALDACTONE) 25 MG tablet Take 0.5 tablets (12.5 mg total) by mouth daily. Take a half tablet once daily. Pt needs appointment for further refills 45 tablet 0   No current facility-administered medications for this visit.    Allergies:    Patient has no known allergies.    Social History:  The patient  reports that she has never smoked. She has never used smokeless tobacco. She reports that she does not drink alcohol and does not use drugs.   Family History:  The patient's family history includes Breast cancer (age of onset: 38) in her mother; Cancer in her father and mother; Thyroid cancer in her daughter.    ROS:  Please see the history of present illness.   Otherwise, review of systems are positive for none.   All other systems are reviewed and negative.    PHYSICAL EXAM: VS:  BP 130/68 (BP Location: Left Arm, Patient Position: Sitting, Cuff Size: Normal)   Pulse 61   Ht 5\' 3"  (1.6 m)   Wt 164 lb 2 oz (74.4 kg)   SpO2 99%   BMI 29.07 kg/m  , BMI Body mass index is 29.07 kg/m. GEN: Well nourished, well developed, in no acute distress  HEENT: normal  Neck: no JVD, carotid bruits, or masses Cardiac: RRR; no  rubs, or gallops,no edema . 1/ 6 systolic ejection murmur in the pulmonic area which is early peaking. Respiratory:  clear to auscultation bilaterally, normal work of breathing GI: soft, nontender, nondistended, + BS MS: no deformity or atrophy  Skin: warm and dry, no rash Neuro:  Strength and sensation are intact Psych: euthymic mood, full affect   EKG:  EKG is  ordered today. EKG showed normal sinus rhythm with no significant ST or T wave changes.   Recent Labs: 03/19/2023: TSH 2.78    Lipid Panel    Component Value Date/Time   LDLDIRECT 74.0 06/07/2015 1057      Wt Readings from Last 3 Encounters:  04/21/23 164 lb 2 oz (74.4 kg)  01/15/23 161 lb 12.8 oz (73.4 kg)  09/18/22 166 lb 9.6 oz (75.6 kg)      ASSESSMENT AND PLAN:  1.  Paroxysmal atrial fibrillation: Doing well overall on metoprolol 12.5 mg twice daily.  She is tolerating anticoagulation with no side effects.  Given overall low burden, no indication for ablation or antiarrhythmic medication.    2.  Essential hypertension: Blood  pressure is controlled on current medications.  Most recent basic metabolic profile in March showed a potassium of 3.9 and creatinine of 0.9.  3.  Hyperlipidemia: On rosuvastatin.  Most recent lipid profile in 2023 showed an LDL of 52.  4.  Hyperthyroidism: She is on small dose of methimazole.  Most recent T3 and T4 were normal in July.    5.  Pulmonary hypertension: This has been mild and stable on most recent echocardiogram.   Disposition:   FU with me in 12 months  Signed,  Lorine Bears, MD  04/21/2023 2:49 PM    [facility]

## 2023-04-21 NOTE — Patient Instructions (Signed)
Medication Instructions:  No changes *If you need a refill on your cardiac medications before your next appointment, please call your pharmacy*   Lab Work: None ordered If you have labs (blood work) drawn today and your tests are completely normal, you will receive your results only by: MyChart Message (if you have MyChart) OR A paper copy in the mail If you have any lab test that is abnormal or we need to change your treatment, we will call you to review the results.   Testing/Procedures: None ordered   Follow-Up: At [facility], you and your health needs are our priority.  As part of our continuing mission to provide you with exceptional heart care, we have created designated Provider Care Teams.  These Care Teams include your primary Cardiologist (physician) and Advanced Practice Providers (APPs -  Physician Assistants and Nurse Practitioners) who all work together to provide you with the care you need, when you need it.  We recommend signing up for the patient portal called "MyChart".  Sign up information is provided on this After Visit Summary.  MyChart is used to connect with patients for Virtual Visits (Telemedicine).  Patients are able to view lab/test results, encounter notes, upcoming appointments, etc.  Non-urgent messages can be sent to your provider as well.   To learn more about what you can do with MyChart, go to [email or web address].    Your next appointment:   12 month(s)  Provider:   You may see Dr. Arida or one of the following Advanced Practice Providers on your designated Care Team:   Christopher Berge, NP Ryan Dunn, PA-C Cadence Furth, PA-C Sheri Hammock, NP    

## 2023-06-01 ENCOUNTER — Telehealth: Payer: Self-pay | Admitting: Cardiovascular Disease

## 2023-06-01 NOTE — Telephone Encounter (Signed)
   Pre-operative Risk Assessment    Patient Name: Doris [name]  DOB: November 06, 1945 MRN: [id number]     Request for Surgical Clearance    Procedure:  Dental Implant  2. When is this surgery scheduled? Press F2 to enter date below and place date in Reason for Visit (see directions below). :1} Date of Surgery:  Clearance TBD                                 Surgeon:  Not indicated Surgeon's Group or Practice Name:  [facility] Phone number:  [phone] Fax number:  [phone]   Type of Clearance Requested:   - Medical    Type of Anesthesia:  Local    Additional requests/questions:    Signed, Narda Amber   06/01/2023, 3:23 PM

## 2023-06-01 NOTE — Telephone Encounter (Signed)
Dr. Kirke Corin , patient's chart was reviewed for preoperative cardiac evaluation.  She was seen by you on 04/21/2023 for follow up, hx of Afib on Eliquis, She is to have dental implant on TBD under local anesthesia. They did not request recommendations on holding Eliquis only Medical clearance. She was doing well on that appointment. According to protocol, we request that you comment on cardiac risk for upcoming procedure since office visit was less than 2 months ago.    Please route your response to p cv div preop.  Thank you, Joni Reining DNP, [facility]

## 2023-06-01 NOTE — Telephone Encounter (Signed)
She is at low risk from a cardiac standpoint.  

## 2023-06-01 NOTE — Telephone Encounter (Signed)
   Patient Name: Doris [name]  DOB: Oct 31, 1945 MRN: [id number]  Primary Cardiologist: Lorine Bears, MD  Chart reviewed as part of pre-operative protocol coverage. Given past medical history and time since last visit, based on ACC/AHA guidelines, Chizuko Trine Kuroda is at acceptable risk for the planned procedure without further cardiovascular testing. Per Dr. Kirke Corin she is low risk for procedure.   The patient was advised that if she develops new symptoms prior to surgery to contact our office to arrange for a follow-up visit, and she verbalized understanding.  I will route this recommendation to the requesting party via Epic fax function and remove from pre-op pool.  Please call with questions.  Joni Reining, NP 06/01/2023, 4:43 PM

## 2023-06-02 NOTE — Telephone Encounter (Signed)
Calling for clarity on if patient needs to stop eliquis. Please advise

## 2023-06-02 NOTE — Telephone Encounter (Signed)
My mistake. She will not need to hold medications. I will route to dentist

## 2023-06-02 NOTE — Telephone Encounter (Signed)
   Patient Name: Doris [name]  DOB: November 25, 1945 MRN: [id number]  Primary Cardiologist: Lorine Bears, MD  Chart reviewed as part of pre-operative protocol coverage.   IF SIMPLE EXTRACTION/CLEANINGS: Simple dental extractions (i.e. 1-2 teeth) are considered low risk procedures per guidelines and generally do not require any specific cardiac clearance. It is also generally accepted that for simple extractions and dental cleanings, there is no need to interrupt blood thinner therapy. Dental implants will not need any change or interruption  in anticoagulation therapy.     SBE prophylaxis is not required for the patient from a cardiac standpoint.  I will route this recommendation to the requesting party via Epic fax function and remove from pre-op pool.  Please call with questions.  Joni Reining, NP 06/02/2023, 1:16 PM

## 2023-06-02 NOTE — Telephone Encounter (Signed)
She will need pharmacy input.  I will route to them as she is on Eliquis.

## 2023-07-18 ENCOUNTER — Other Ambulatory Visit: Payer: Self-pay | Admitting: Cardiovascular Disease

## 2023-07-18 DIAGNOSIS — I480 Paroxysmal atrial fibrillation: Secondary | ICD-10-CM

## 2023-07-20 NOTE — Telephone Encounter (Signed)
Prescription refill request for Eliquis received. Indication:afib Last office visit:8/24 Scr:0.90  3/24 Age: 77 Weight:74.4  kg  Prescription refilled

## 2023-08-06 ENCOUNTER — Ambulatory Visit: Payer: Medicare PPO | Admitting: Pulmonary Disease

## 2023-09-03 ENCOUNTER — Ambulatory Visit: Payer: Medicare PPO | Admitting: Physician Assistant

## 2023-09-03 VITALS — BP 139/84 | HR 69 | Temp 97.7°F | Ht 63.0 in | Wt 165.2 lb

## 2023-09-03 DIAGNOSIS — I10 Essential (primary) hypertension: Secondary | ICD-10-CM | POA: Diagnosis not present

## 2023-09-03 DIAGNOSIS — R195 Other fecal abnormalities: Secondary | ICD-10-CM

## 2023-09-03 DIAGNOSIS — I4891 Unspecified atrial fibrillation: Secondary | ICD-10-CM

## 2023-09-03 DIAGNOSIS — Z8719 Personal history of other diseases of the digestive system: Secondary | ICD-10-CM | POA: Diagnosis not present

## 2023-09-03 DIAGNOSIS — E0500 Thyrotoxicosis with diffuse goiter without thyrotoxic crisis or storm: Secondary | ICD-10-CM

## 2023-09-03 NOTE — Progress Notes (Signed)
 New patient visit   Patient: Doris [name]   DOB: June 15, 1946   78 y.o. Female  MRN: [id number] Visit Date: 09/03/2023  Today's healthcare provider: Manuelita Flatness, PA-C   Cc. New patient ,establish care  Subjective    Doris [name] is a 78 y.o. female who presents today as a new patient to establish care.   Discussed the use of AI scribe software for clinical note transcription with the patient, who gave verbal consent to proceed.  History of Present Illness   The patient, with a history of colitis, Grave's disease, afib, HTN  presents with increased frequency of bowel movements over the past three to four weeks. The patient describes the bowel movements as normal-- solid, not associated with diarrhea or pain, but notes a feeling of 'weakness' in the stomach and legs prior to bowel movements. The weakness resolves after bowel movements. The patient reports no changes in diet, but has recently started a glucose-free diet and switched to plant and rice milk. The patient also mentions a high level of stress due to caring for an elderly friend in assisted living. The patient has a history of gallstones but is not currently experiencing any symptoms related to this condition.   She is looking to transition her care in this area instead of w/ [facility]. She follows with cardiology and is looking for a new GI.      Past Medical History:  Diagnosis Date   Graves disease    Hyperlipidemia    Hypertension    Hyperthyroidism    PAF (paroxysmal atrial fibrillation) (HCC)    a. Dx 03/2019 -->AF RVR @ [facility]; b. 04/2019 Zio: <1% Afib burden; c. CHA2DS2VASc = 3-->Eliquis .   PAH (pulmonary artery hypertension) (HCC)    a. 05/2017 Echo: EF 60-65%, PASP ; b. 04/2019 Echo: EF 60-65% PASP ; c. 10/2021 Echo: EF 60-65%, no rwma, RVSP 42.57mmHg. Mild MR. Mod TR.   Palpitations    a. 05/2017 Echo: EF 60-65%; b. 05/2017 48h Holter: Rare PACs and PVCs. 11 beat run of SVT w/ aberrancy; c. 04/2019  [facility]io: Avg HR 56. 37 episodes of SVT, longest 19. Intermittent AF (<1% burden, longest 1:39). Rare PVCs.   Positive TB test    Valvular heart disease    a. 10/2021 Echo: Mild MR, Mod TR.   Past Surgical History:  Procedure Laterality Date   CESAREAN SECTION     x 2   EXCISION METACARPAL MASS Right 12/17/2017   Procedure: EXCISION METACARPAL MASS;  Surgeon: Kathlynn Sharper, MD;  Location: [facility] ORS;  Service: Orthopedics;  Laterality: Right;   Family Status  Relation Name Status   Mother  Deceased at age 82 y.o       cancer   Father  Deceased       cancer   Sister  Alive   Sister  Alive   Sister  Alive   Sister  Alive   Sister  Alive   Sister  Deceased   Sister  Deceased       brain aneurysm   Daughter  Alive   MGM  Deceased   MGF  Alive   PGM  Deceased   PGF  Deceased  No partnership data on file   Family History  Problem Relation Age of Onset   Breast cancer Mother 65   Cancer Mother        Breast Cancer   Cancer Father        throat Cancer  Thyroid  cancer Daughter    Social History   Socioeconomic History   Marital status: Married    Spouse name: Not on file   Number of children: Not on file   Years of education: Not on file   Highest education level: Not on file  Occupational History   Not on file  Tobacco Use   Smoking status: Never   Smokeless tobacco: Never  Vaping Use   Vaping status: Never Used  Substance and Sexual Activity   Alcohol use: No    Alcohol/week: 0.0 standard drinks of alcohol   Drug use: No   Sexual activity: Yes  Other Topics Concern   Not on file  Social History Narrative   Grew up in [place]. Lives with husband in a 2 story home.  Has 5 children. (1 set of twins)   Retired architectural technologist.     Education: some college.    Social Drivers of Corporate Investment Banker Strain: Low Risk  (06/24/2022)   Received from [facility], [facility]   Overall Financial Resource Strain (CARDIA)    Difficulty of Paying  Living Expenses: Not hard at all  Food Insecurity: No Food Insecurity (01/30/2023)   Received from [facility], [facility]   Hunger Vital Sign    Worried About Running Out of Food in the Last Year: Never true    Ran Out of Food in the Last Year: Never true  Transportation Needs: No Transportation Needs (12/03/2022)   Received from [facility], [facility]   [facility] - Transportation    Lack of Transportation (Medical): No    Lack of Transportation (Non-Medical): No  Physical Activity: Sufficiently Active (06/24/2022)   Received from [facility], [facility]   Exercise Vital Sign    Days of Exercise per Week: 3 days    Minutes of Exercise per Session: 60 min  Stress: No Stress Concern Present (06/24/2022)   Received from [facility], [facility] - Occupational Stress Questionnaire    Feeling of Stress : Not at all  Social Connections: Socially Integrated (06/24/2022)   Received from [facility], [facility]   Social Connection and Isolation Panel [NHANES]    Frequency of Communication with Friends and Family: More than three times a week    Frequency of Social Gatherings with Friends and Family: More than three times a week    Attends Religious Services: More than 4 times per year    Active Member of Clubs or Organizations: Yes    Attends Engineer, Structural: More than 4 times per year    Marital Status: Married   Outpatient Medications Prior to Visit  Medication Sig   acetaminophen  (TYLENOL ) 325 MG tablet Take 325 mg by mouth every 6 (six) hours as needed for headache.    amLODipine  (NORVASC ) 5 MG tablet Take 1 tablet (5 mg total) by mouth daily.   Carboxymethylcellul-Glycerin (LUBRICATING EYE DROPS OP) Place 2 drops into both eyes daily as needed (dry eyes).   Cholecalciferol (VITAMIN D3) 25 MCG (1000 UT) CAPS Take by mouth. Take 1 capsule (25 mcg total) by mouth daily.   ELIQUIS  5 MG TABS  tablet TAKE 1 TABLET BY MOUTH TWICE A DAY   losartan  (COZAAR ) 100 MG tablet Take 1 tablet (100 mg total) by mouth daily.   methimazole  (TAPAZOLE ) 5 MG tablet Take 0.5 tablets (2.5 mg total)  by mouth daily.   metoprolol  tartrate (LOPRESSOR ) 25 MG tablet TAKE 1/2 TABLET(12.5 MG) BY MOUTH TWICE DAILY   Multiple Vitamin (MULTIVITAMIN WITH MINERALS) TABS tablet Take 1 tablet by mouth daily.   rosuvastatin  (CRESTOR ) 5 MG tablet Take 5 mg by mouth at bedtime.    spironolactone  (ALDACTONE ) 25 MG tablet Take 0.5 tablets (12.5 mg total) by mouth daily.   No facility-administered medications prior to visit.   No Known Allergies  Immunization History  Administered Date(s) Administered   Fluad Quad(high Dose 65+) 05/16/2020, 05/19/2022   Fluad Trivalent(High Dose 65+) 05/22/2023   Influenza Inj Mdck Quad Pf 05/16/2020   Influenza, Seasonal, Injecte, Preservative Fre 08/06/2011, 06/24/2012   Influenza,inj,Quad PF,6+ Mos 06/28/2014, 06/07/2015, 06/10/2016, 05/21/2017, 06/07/2018, 05/05/2019, 06/14/2021   Moderna Sars-Cov-2 Peds vaccine 47yrs thru 70yrs 01/07/2021   Moderna Sars-Covid-2 Vaccination 09/24/2019, 10/22/2019, 06/18/2020, 01/07/2021   Pneumococcal Conjugate-13 05/15/2015   Pneumococcal Polysaccharide-23 01/26/2014   Pneumococcal-Unspecified 05/15/2015   Rsv, Bivalent, Protein Subunit Rsvpref,pf [place]) 07/18/2023   Td 10/02/2010   Tdap 10/02/2010, 02/18/2023    Health Maintenance  Topic Date Due   Hepatitis C Screening  Never done   Zoster Vaccines- Shingrix (1 of 2) Never done   Medicare Annual Wellness (AWV)  08/01/2016   COVID-19 Vaccine (6 - 2024-25 season) 04/26/2023   MAMMOGRAM  02/11/2024   DTaP/Tdap/Td (4 - Td or Tdap) 02/17/2033   Pneumonia Vaccine 40+ Years old  Completed   INFLUENZA VACCINE  Completed   DEXA SCAN  Completed   HPV VACCINES  Aged Out    Patient Care Team: Cyndi Shaver, PA-C as PCP - General (Physician Assistant) Darron Deatrice LABOR, MD as PCP -  Cardiology (Cardiology) Darron Deatrice LABOR, MD as Consulting Physician (Cardiology) Tamea Dedra CROME, MD as Consulting Physician (Pulmonary Disease)  Review of Systems      Objective    BP 139/84   Pulse 69   Temp 97.7 F (36.5 C) (Oral)   Ht 5' 3 (1.6 m)   Wt 165 lb 4 oz (75 kg)   SpO2 100%   BMI 29.27 kg/m     Physical Exam Constitutional:      General: She is awake.     Appearance: She is well-developed.  HENT:     Head: Normocephalic.  Eyes:     Conjunctiva/sclera: Conjunctivae normal.  Cardiovascular:     Rate and Rhythm: Normal rate and regular rhythm.     Heart sounds: Normal heart sounds.  Pulmonary:     Effort: Pulmonary effort is normal.     Breath sounds: Normal breath sounds.  Abdominal:     Palpations: Abdomen is soft.     Tenderness: There is no abdominal tenderness.  Skin:    General: Skin is warm.  Neurological:     Mental Status: She is alert and oriented to person, place, and time.  Psychiatric:        Attention and Perception: Attention normal.        Mood and Affect: Mood normal.        Speech: Speech normal.        Behavior: Behavior is cooperative.     Depression Screen    09/03/2016   11:18 AM 08/02/2015    1:31 PM  PHQ 2/9 Scores  PHQ - 2 Score 0 0   No results found for any visits on 09/03/23.  Assessment & Plan     Abnormal stools Assessment & Plan: Pt has had history of one colitis episode  Current symptoms without abnormal pain Symptoms improve after bowel movements.   Recommended tracking diet and symptoms to identify potential triggers. Explained that IBS is diagnosed when all tests are normal but symptoms persist. Medications for diarrhea were discussed but not recommended at this time due to normal bowel movements. - Refer to gastroenterologist of pt's choice  Orders: -     Ambulatory referral to Gastroenterology  History of colitis -     Ambulatory referral to Gastroenterology  Atrial fibrillation with normal  ventricular rate (HCC) Assessment & Plan: Anticoag with eliquis  5 mg bid Rate controlled with metop 25 mg daily Follows with cardiology   Primary hypertension Assessment & Plan: Chronic, well controlled. Reviewed last cmp  Manages with amlodipine  5 mg, losartan  100 mg, metop 25 mg, spironolactone  12.5 mg   Graves disease Assessment & Plan: No h/o of RAI or surgery Manages with methimazole  2.5 mg daily Follows with endocrinology    Return in about 6 months (around 03/02/2024), or if symptoms worsen or fail to improve, for chronic conditions.      Manuelita Flatness, PA-C  [facility] at [facility] [phone] (phone) [phone] (fax)  [facility]

## 2023-09-04 ENCOUNTER — Encounter: Payer: Self-pay | Admitting: Physician Assistant

## 2023-09-04 DIAGNOSIS — Z8719 Personal history of other diseases of the digestive system: Secondary | ICD-10-CM | POA: Insufficient documentation

## 2023-09-04 DIAGNOSIS — R195 Other fecal abnormalities: Secondary | ICD-10-CM | POA: Insufficient documentation

## 2023-09-04 NOTE — Assessment & Plan Note (Signed)
 Chronic, well controlled. Reviewed last cmp  Manages with amlodipine 5 mg, losartan 100 mg, metop 25 mg, spironolactone 12.5 mg

## 2023-09-04 NOTE — Assessment & Plan Note (Signed)
 No h/o of RAI or surgery Manages with methimazole 2.5 mg daily Follows with endocrinology

## 2023-09-04 NOTE — Assessment & Plan Note (Signed)
 Anticoag with eliquis 5 mg bid Rate controlled with metop 25 mg daily Follows with cardiology

## 2023-09-04 NOTE — Assessment & Plan Note (Signed)
 Pt has had history of one colitis episode Current symptoms without abnormal pain Symptoms improve after bowel movements.   Recommended tracking diet and symptoms to identify potential triggers. Explained that IBS is diagnosed when all tests are normal but symptoms persist. Medications for diarrhea were discussed but not recommended at this time due to normal bowel movements. - Refer to gastroenterologist of pt's choice

## 2023-09-10 NOTE — Progress Notes (Signed)
Chief Complaint: abnormal stools, history of colitis Primary GI Doctor:  Dr. Lavon Paganini  HPI: Patient is a 78 year old Doris [name] patient with past medical history of colitis, Grave's disease, afib (on eliquis), HTN, and diverticulitis, who was referred to me by Alfredia Ferguson, PA-C on 09/03/23 for a complaint of abnormal stools, history of colitis.  On 09/03/23 patient was seen by PCP and complained of increased frequency of bowel movements over the past 3-4 weeks. Bowel movements are formed, but has "weakness" feeling in stomach. Admits to high level of stress caring for elderly friend. Has gone gluten free in last 2 weeks.   On 11/23/22 patient seen in [facility] ED for c/o of abdominal pain and rectal bleeding. ED labs notable for white blood cell count 16.7. CT abd/pelvis showed Sigmoid diverticulitis/colitis with adjacent fat stranding and small volume pelvic free fluid. No evidence of free air or well-defined fluid collections. Patient discharged home with a prescription for Cipro and Flagyl.   Interval History     Patient presents today with main complaint of altered bowels intermittently over the course of the past two years. Patient reports her normal bowel routine consists of one formed stool per day. No constipation. Intermittently she will have 2-3 formed stools if she eats certain foods such as spicy or fried food out at restaurant. She admits she has been going out more often last two years due to social events with friends.  Patient denies abdominal pain or rectal bleeding. She does get what she describes as "butterflies in stomach" at times. She reports it tends to happen during periods of stress. She is caring for a 79 year old friend whom she cares for which causes her stress.  Patient denies GERD or dysphagia. Patient denies nausea, vomiting, or weight loss. No alcohol use. Nonsmoker. No NSAID use.  Patient on Eliquis twice daily for afib. No new medications.Patient never had EGD or colonoscopy. Two  negative cologuards.  Patient has never seen gastroenterologist. Patient's family history includes mother with breast CA and father with esophageal CA, and two sisters with IBS.  Wt Readings from Last 3 Encounters:  09/11/23 166 lb (75.3 kg)  09/03/23 165 lb 4 oz (75 kg)  04/21/23 164 lb 2 oz (74.4 kg)    Past Medical History:  Diagnosis Date   Colitis    Graves disease    Hyperlipidemia    Hypertension    Hyperthyroidism    PAF (paroxysmal atrial fibrillation) (HCC)    a. Dx 03/2019 -->AF RVR @ [facility]; b. 04/2019 [facility]: <1% Afib burden; c. CHA2DS2VASc = 3-->Eliquis.   PAH (pulmonary artery hypertension) (HCC)    a. 05/2017 Echo: EF 60-65%, PASP ; b. 04/2019 Echo: EF 60-65% PASP ; c. 10/2021 Echo: EF 60-65%, no rwma, RVSP 42.10mmHg. Mild MR. Mod TR.   Palpitations    a. 05/2017 Echo: EF 60-65%; b. 05/2017 48h Holter: Rare PACs and PVCs. 11 beat run of SVT w/ aberrancy; c. 04/2019 [facility]io: Avg HR 56. 37 episodes of SVT, longest 19. Intermittent AF (<1% burden, longest 1:39). Rare PVCs.   Positive TB test    Valvular heart disease    a. 10/2021 Echo: Mild MR, Mod TR.    Past Surgical History:  Procedure Laterality Date   CESAREAN SECTION     x 2   EXCISION METACARPAL MASS Right 12/17/2017   Procedure: EXCISION METACARPAL MASS;  Surgeon: Kennedy Bucker, MD;  Location: [facility] ORS;  Service: Orthopedics;  Laterality: Right;   Current Outpatient  Medications  Medication Sig Dispense Refill   acetaminophen (TYLENOL) 325 MG tablet Take 325 mg by mouth every 6 (six) hours as needed for headache.      amLODipine (NORVASC) 5 MG tablet Take 1 tablet (5 mg total) by mouth daily. 90 tablet 3   Carboxymethylcellul-Glycerin (LUBRICATING EYE DROPS OP) Place 2 drops into both eyes daily as needed (dry eyes).     Cholecalciferol (VITAMIN D3) 25 MCG (1000 UT) CAPS Take by mouth. Take 1 capsule (25 mcg total) by mouth daily.     ELIQUIS 5 MG TABS tablet TAKE 1 TABLET BY MOUTH TWICE A DAY 180 tablet 1    losartan (COZAAR) 100 MG tablet Take 1 tablet (100 mg total) by mouth daily. 90 tablet 3   methimazole (TAPAZOLE) 5 MG tablet Take 0.5 tablets (2.5 mg total) by mouth daily. 45 tablet 3   metoprolol tartrate (LOPRESSOR) 25 MG tablet TAKE 1/2 TABLET(12.5 MG) BY MOUTH TWICE DAILY 90 tablet 3   Multiple Vitamin (MULTIVITAMIN WITH MINERALS) TABS tablet Take 1 tablet by mouth daily.     rosuvastatin (CRESTOR) 5 MG tablet Take 5 mg by mouth at bedtime.      spironolactone (ALDACTONE) 25 MG tablet Take 0.5 tablets (12.5 mg total) by mouth daily. 45 tablet 3   No current facility-administered medications for this visit.   Allergies as of 09/11/2023   (No Known Allergies)   Family History  Problem Relation Age of Onset   Breast cancer Mother 29   Throat cancer Father    Thyroid cancer Daughter    Colon cancer Neg Hx    Review of Systems:    Constitutional: No weight loss, fever, chills, weakness or fatigue HEENT: Eyes: No change in vision               Ears, Nose, Throat:  No change in hearing or congestion Skin: No rash or itching Cardiovascular: No chest pain, chest pressure or palpitations   Respiratory: No SOB or cough Gastrointestinal: See HPI and otherwise negative Genitourinary: No dysuria or change in urinary frequency Neurological: No headache, dizziness or syncope Musculoskeletal: No new muscle or joint pain Hematologic: No bleeding or bruising Psychiatric: No history of depression or anxiety   Physical Exam:  Vital signs: BP 124/70   Pulse 60   Ht 5\' 3"  (1.6 m)   Wt 166 lb (75.3 kg)   BMI 29.41 kg/m   Constitutional:   Pleasant Caucasian Doris [name] appears to be in NAD, Well developed, Well nourished, alert and cooperative Neck:  Supple Throat: Oral cavity and pharynx without inflammation, swelling or lesion.  Respiratory: Respirations even and unlabored. Lungs clear to auscultation bilaterally.   No wheezes, crackles, or rhonchi.  Cardiovascular: Normal S1, S2. Regular  rate and rhythm. No peripheral edema, cyanosis or pallor.  Gastrointestinal:  Soft, nondistended, nontender. No rebound or guarding. Normal bowel sounds. No appreciable masses or hepatomegaly. Rectal:  Not performed.  Neurologic:  Alert and  oriented x4;  grossly normal neurologically.  Skin:   Dry and intact without significant lesions or rashes. Psychiatric: Oriented to person, place and time. Demonstrates good judgement and reason without abnormal affect or behaviors.  RELEVANT LABS AND IMAGING: CBC    Latest Ref Rng & Units 01/24/2020   10:52 PM 05/13/2019    1:58 PM 12/08/2017   10:Doris AM  CBC  WBC 4.0 - 10.5 K/uL 9.9  6.7  7.4   Hemoglobin 12.0 - 15.0 g/dL 54.0  98.1  14.0  Hematocrit 36.0 - 46.0 % 37.5  Doris.7  39.7   Platelets 150 - 400 K/uL 251  232  230     CMP     Latest Ref Rng & Units 02/20/2020   11:12 AM 02/02/2020   12:03 PM 01/24/2020   10:52 PM  CMP  Glucose 70 - 99 mg/dL 89  098  119   BUN 8 - 23 mg/dL 22  18  27    Creatinine 0.44 - 1.00 mg/dL 1.47  8.29  5.62   Sodium 135 - 145 mmol/L 136  128  132   Potassium 3.5 - 5.1 mmol/L 4.6  4.4  3.3   Chloride 98 - 111 mmol/L 101  95  96   CO2 22 - 32 mmol/L 27  28  25    Calcium 8.9 - 10.3 mg/dL 9.5  9.1  9.3     Lab Results  Component Value Date   TSH 2.78 03/19/2023    08/10/23 labs show: tsh 2.23, sodium 134, BUN 19/ Creat 1, normal LFT's 06/2015 negative cologuard 3/22 negative cologuard   11/23/2022 CT abd/pelvis (care everywhere) FINDINGS:  LOWER CHEST: Lung bases are clear. No pleural effusion.  LIVER: Normal liver contour. There are multiple subcentimeter hypoattenuating lesions throughout the hepatic parenchyma, too small to characterize.  BILIARY: The gallbladder is normal in appearance. No biliary ductal dilatation.   SPLEEN: Normal in size and contour.  PANCREAS: Normal pancreatic contour.  No focal lesions.  No ductal dilation.  ADRENAL GLANDS: Normal appearance of the adrenal glands.  KIDNEYS/URETERS:  Symmetric renal enhancement. Mild right hydronephrosis. Bilateral renal cortical scarring. Bilateral subcentimeter hypodensities that are too small to characterize on CT., although likely representing cysts No solid renal mass.  BLADDER: Trace wall thickening of the superior bladder, likely reactive.  REPRODUCTIVE ORGANS: Anteverted uterus. No adnexal masses.  GI TRACT: Small sliding-type hiatal hernia. Unremarkable appearance of the stomach and the duodenal sweep. No thickened or dilated loop of small bowel. Appendix is unremarkable. There is wall thickening of the sigmoid colon with surrounding fat stranding and layering free fluid and a few scattered diverticula. No fluid collection or free air.  PERITONEUM, RETROPERITONEUM AND MESENTERY: No free air. Small volume free fluid layering in the pelvis. No fluid collection.  LYMPH NODES: No pathologically enlarged lymph nodes.  VESSELS: Hepatic and portal veins are patent.  Normal caliber aorta. Mild atherosclerosis of the aorta and its branches.  BONES and SOFT TISSUES: No aggressive osseous lesions. Rectus diastases multilevel degenerative changes of the spine.  IMPRESSION:  Sigmoid diverticulitis/colitis with adjacent fat stranding and small volume pelvic free fluid. No evidence of free air or well-defined fluid collections.   12/19/22 echo- Left ventricular ejection fraction, by estimation, is 55 to 60%.   Assessment: Encounter Diagnoses  Name Primary?   Altered bowel habits Yes   Diverticulosis of colon without hemorrhage    History of diverticulitis     78 year old Doris [name] patient that presents for main complaint of altered bowel habits related to both food triggers and stress. We spent several minutes discussing dietary modifications (cutting out dairy, gluten, fructose)  and way to manage stress. Her symptoms are currently not affecting her overall QOL. We also discussed colonoscopy which she has never had, but she declines at this time.    Plan: - Hand out pamphlet on Low FOD map diet  -Recommend colonoscopy, patient declined. -Follow-up as needed  Thank you for the courtesy of this consult. Please call me with any  questions or concerns.   Malli Falotico, FNP-C [facility] 09/11/2023, 10:04 AM  Cc: Alfredia Ferguson, PA-C

## 2023-09-11 ENCOUNTER — Ambulatory Visit: Payer: Medicare PPO | Admitting: Pulmonary Disease

## 2023-09-11 ENCOUNTER — Ambulatory Visit: Payer: Medicare PPO | Admitting: Gastroenterology

## 2023-09-11 ENCOUNTER — Encounter: Payer: Self-pay | Admitting: Gastroenterology

## 2023-09-11 VITALS — BP 124/70 | HR 60 | Ht 63.0 in | Wt 166.0 lb

## 2023-09-11 DIAGNOSIS — R194 Change in bowel habit: Secondary | ICD-10-CM

## 2023-09-11 DIAGNOSIS — Z8719 Personal history of other diseases of the digestive system: Secondary | ICD-10-CM

## 2023-09-11 DIAGNOSIS — K5730 Diverticulosis of large intestine without perforation or abscess without bleeding: Secondary | ICD-10-CM

## 2023-09-11 NOTE — Patient Instructions (Signed)
You have been given a Low FODMAP diet to read and follow.  Due to recent changes in healthcare laws, you may see the results of your imaging and laboratory studies on MyChart before your provider has had a chance to review them.  We understand that in some cases there may be results that are confusing or concerning to you. Not all laboratory results come back in the same time frame and the provider may be waiting for multiple results in order to interpret others.  Please give [place] 48 hours in order for your provider to thoroughly review all the results before contacting the office for clarification of your results.    _______________________________________________________  If your blood pressure at your visit was 140/90 or greater, please contact your primary care physician to follow up on this.  _______________________________________________________  If you are age 21 or older, your body mass index should be between 23-30. Your Body mass index is 29.41 kg/m. If this is out of the aforementioned range listed, please consider follow up with your Primary Care Provider.  If you are age 36 or younger, your body mass index should be between 19-25. Your Body mass index is 29.41 kg/m. If this is out of the aformentioned range listed, please consider follow up with your Primary Care Provider.   ________________________________________________________  The [facility] providers would like to encourage you to use [facility] to communicate with providers for non-urgent requests or questions.  Due to long hold times on the telephone, sending your provider a message by [facility] may be a faster and more efficient way to get a response.  Please allow 48 business hours for a response.  Please remember that this is for non-urgent requests.  _______________________________________________________  I appreciate the opportunity to care for you.

## 2023-09-17 ENCOUNTER — Encounter: Payer: Self-pay | Admitting: Pulmonary Disease

## 2023-09-17 ENCOUNTER — Ambulatory Visit: Payer: Medicare PPO | Admitting: Pulmonary Disease

## 2023-09-17 VITALS — BP 120/78 | HR 69 | Temp 98.6°F | Ht 63.0 in | Wt 165.2 lb

## 2023-09-17 DIAGNOSIS — E0500 Thyrotoxicosis with diffuse goiter without thyrotoxic crisis or storm: Secondary | ICD-10-CM

## 2023-09-17 DIAGNOSIS — G4736 Sleep related hypoventilation in conditions classified elsewhere: Secondary | ICD-10-CM | POA: Diagnosis not present

## 2023-09-17 DIAGNOSIS — I5189 Other ill-defined heart diseases: Secondary | ICD-10-CM | POA: Diagnosis not present

## 2023-09-17 DIAGNOSIS — I4891 Unspecified atrial fibrillation: Secondary | ICD-10-CM

## 2023-09-17 DIAGNOSIS — I272 Pulmonary hypertension, unspecified: Secondary | ICD-10-CM

## 2023-09-17 NOTE — Progress Notes (Signed)
Subjective:    Patient ID: Doris [name], female    DOB: Aug 16, 1946, 78 y.o.   MRN: [id number]  Patient Care Team: Alfredia Ferguson, PA-C as PCP - General (Physician Assistant) Iran Ouch, MD as PCP - Cardiology (Cardiology) [place] Ouch, MD as Consulting Physician (Cardiology) Salena Saner, MD as Consulting Physician (Pulmonary Disease)  Chief Complaint  Patient presents with   Follow-up    No SOB, wheezing or cough. 2L at bedtime.    BACKGROUND/INTERVAL:Patient is a 78 year old lifelong never smoker who follows here for the issue of pulmonary hypertension initially classified as moderate by echocardiogram August 2020, most recent echo of April 2024 showed pulmonary hypertension to be mild.  Etiology of her pulmonary hypertension likely related to Graves' disease and diastolic dysfunction.  For diastolic dysfunction and atrial fibrillation she follows with cardiology.  She has nocturnal hypoxemia associated with her pulmonary hypertension with no overt symptoms of sleep apnea and a very low Epworth scale.  Oxygen supplementation is only 2 L/min at nighttime.  She was last seen in May 2024 by Rubye Oaks, NP.  This is a scheduled follow-up visit.  HPI Discussed the use of AI scribe software for clinical note transcription with the patient, who gave verbal consent to proceed.  History of Present Illness   The patient, with a history of pulmonary hypertension and nocturnal hypoxemia, has been managing well with no new or worsening symptoms. She has been using nighttime oxygen therapy without any reported issues such as snoring or nocturnal awakenings. She maintains a regular sleep schedule, aiming for seven hours of sleep each night. The patient's last echocardiogram was performed in April 2024, with results indicating stability.  Patient follows with Dr. Kirke Corin for diastolic dysfunction and atrial fibrillation.  Of note she does have Graves' disease (first diagnosed in  2016) and is on Tapazole she follows with Dr. Lafe Garin, [facility].  She does not endorse any fatigue, shortness of breath, chest pain, no paroxysmal nocturnal dyspnea nor lower extremity edema.  No fevers, chills or sweats.  She is compliant with nocturnal oxygen and notes benefit.  She does not exhibit any symptoms consistent with sleep apnea.  No recent exacerbations of thyrotoxicosis on Tapazole.  Overall she feels well and looks well.   Review of Systems A 10 point review of systems was performed and it is as noted above otherwise negative.   Patient Active Problem List   Diagnosis Date Noted   Abnormal stools 09/04/2023   History of colitis 09/04/2023   Chronic respiratory failure with hypoxia (HCC) 01/15/2023   Pulmonary hypertension (HCC) 01/15/2023   Atrial fibrillation with normal ventricular rate (HCC) 08/12/2019   Graves disease 01/11/2016   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   HTN (hypertension) 01/26/2014    Social History   Tobacco Use   Smoking status: Never   Smokeless tobacco: Never  Substance Use Topics   Alcohol use: No    Alcohol/week: 0.0 standard drinks of alcohol    No Known Allergies  Current Meds  Medication Sig   acetaminophen (TYLENOL) 325 MG tablet Take 325 mg by mouth every 6 (six) hours as needed for headache.    amLODipine (NORVASC) 5 MG tablet Take 1 tablet (5 mg total) by mouth daily.   Carboxymethylcellul-Glycerin (LUBRICATING EYE DROPS OP) Place 2 drops into both eyes daily as needed (dry eyes).   Cholecalciferol (VITAMIN D3) 25 MCG (1000 UT) CAPS Take by mouth. Take 1 capsule (25 mcg total) by mouth  daily.   dexamethasone (DECADRON) 0.5 MG/5ML solution as needed.   ELIQUIS 5 MG TABS tablet TAKE 1 TABLET BY MOUTH TWICE A DAY   losartan (COZAAR) 100 MG tablet Take 1 tablet (100 mg total) by mouth daily.   methimazole (TAPAZOLE) 5 MG tablet Take 0.5 tablets (2.5 mg total) by mouth daily.   metoprolol tartrate (LOPRESSOR) 25 MG  tablet TAKE 1/2 TABLET(12.5 MG) BY MOUTH TWICE DAILY   Multiple Vitamin (MULTIVITAMIN WITH MINERALS) TABS tablet Take 1 tablet by mouth daily.   rosuvastatin (CRESTOR) 5 MG tablet Take 5 mg by mouth at bedtime.    spironolactone (ALDACTONE) 25 MG tablet Take 0.5 tablets (12.5 mg total) by mouth daily.    Immunization History  Administered Date(s) Administered   Fluad Quad(high Dose 65+) 05/16/2020, 05/19/2022   Fluad Trivalent(High Dose 65+) 05/22/2023   Influenza Inj Mdck Quad Pf 05/16/2020   Influenza, Seasonal, Injecte, Preservative Fre 08/06/2011, 06/24/2012   Influenza,inj,Quad PF,6+ Mos 06/28/2014, 06/07/2015, 06/10/2016, 05/21/2017, 06/07/2018, 05/05/2019, 06/14/2021   Moderna Sars-Cov-2 Peds vaccine 46yrs thru 68yrs 01/07/2021   Moderna Sars-Covid-2 Vaccination 09/24/2019, 10/22/2019, 06/18/2020, 01/07/2021   Pneumococcal Conjugate-13 05/15/2015   Pneumococcal Polysaccharide-23 01/26/2014   Pneumococcal-Unspecified 05/15/2015   Rsv, Bivalent, Protein Subunit Rsvpref,pf Verdis Frederickson) 07/18/2023   Td 10/02/2010   Tdap 10/02/2010, 02/18/2023        Objective:     BP 120/78 (BP Location: Right Arm, Cuff Size: Normal)   Pulse 69   Temp 98.6 F (37 C)   Ht 5\' 3"  (1.6 m)   Wt 165 lb 3.2 oz (74.9 kg)   SpO2 100%   BMI 29.26 kg/m   SpO2: 100 % O2 Device: None (Room air)  GENERAL: Well-developed, slightly overweight woman in no acute distress.  Fully ambulatory.  No conversational dyspnea. HEAD: Normocephalic, atraumatic.  EYES: Pupils equal, round, reactive to light.  No scleral icterus.  MOUTH: Nose/mouth/throat not examined due to masking requirements for COVID 19 (patient still wearing mask). NECK: Supple. No thyromegaly. Trachea midline. No JVD.  No adenopathy. PULMONARY: Good air entry bilaterally.  No adventitious sounds. CARDIOVASCULAR: S1 and S2. Regular rate and rhythm.  No rubs, murmurs or gallops heard. ABDOMEN: Benign. MUSCULOSKELETAL: No joint deformity, no  clubbing, no edema.  NEUROLOGIC: No focal deficits, no gait disturbance, speech is fluent.  No tremor. SKIN: Intact,warm,dry.  On limited exam no rashes PSYCH: Mood and behavior normal.   Assessment & Plan:     ICD-10-CM   1. Pulmonary hypertension (HCC)  I27.20     2. 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 Graves disease  E05.00     4. Diastolic dysfunction  I51.89     5. Atrial fibrillation with normal ventricular rate (HCC)  I48.91      Discussion:    Nocturnal Hypoxemia Nocturnal hypoxemia related to pulmonary hypertension, uses supplemental oxygen at night. Reports no issues with snoring or nocturnal awakenings, aiming for seven hours of sleep nightly. - Continue supplemental oxygen at night  Pulmonary Hypertension Pulmonary hypertension mild per last echo of April 2024. Patient feels well with stable blood pressure. Discussed extending echocardiogram interval to six months due to stable condition and patient preference.  Etiology could be due to Graves' disease, diastolic dysfunction, tricuspid regurgitation. - Order echocardiogram in six months follow-up after echocardiogram is done - Send instructions via MyChart - Specify female sonographer for echocardiogram.      Advised if symptoms do not  improve or worsen, to please contact office for sooner follow up or seek emergency care.    I spent 30 minutes of dedicated to the care of this patient on the date of this encounter to include pre-visit review of records, face-to-face time with the patient discussing conditions above, post visit ordering of testing, clinical documentation with the electronic health record, making appropriate referrals as documented, and communicating necessary findings to members of the patients care team.     C. Danice Goltz, MD Advanced Bronchoscopy PCCM [facility]    *This note was generated using voice recognition  software/Dragon and/or AI transcription program.  Despite best efforts to proofread, errors can occur which can change the meaning. Any transcriptional errors that result from this process are unintentional and may not be fully corrected at the time of dictation.

## 2023-09-17 NOTE — Patient Instructions (Signed)
VISIT SUMMARY:  You have been managing your pulmonary hypertension and nocturnal hypoxemia well, with no new or worsening symptoms. Your nighttime oxygen therapy is effective, and you maintain a regular sleep schedule. Your last echocardiogram in April showed stable results, and despite feeling well, your arterial pressure was slightly elevated.  YOUR PLAN:  -NOCTURNAL HYPOXEMIA: Nocturnal hypoxemia means low oxygen levels during sleep. You should continue using your supplemental oxygen at night as it is helping to manage this condition.  -PULMONARY HYPERTENSION: Pulmonary hypertension is high blood pressure in the arteries of your lungs. Your condition is stable, and we will extend the interval for your next echocardiogram to six months. Instructions for this will be sent via MyChart, and a female sonographer will perform the echocardiogram.  INSTRUCTIONS:  Please continue using your supplemental oxygen at night. We will schedule your next echocardiogram in six months and send you the instructions via MyChart. A female sonographer will perform the echocardiogram.

## 2023-09-21 ENCOUNTER — Ambulatory Visit: Payer: Medicare PPO | Admitting: Internal Medicine

## 2023-09-21 ENCOUNTER — Encounter: Payer: Self-pay | Admitting: Internal Medicine

## 2023-09-21 VITALS — BP 120/70 | HR 71 | Ht 63.0 in | Wt 167.2 lb

## 2023-09-21 DIAGNOSIS — E0500 Thyrotoxicosis with diffuse goiter without thyrotoxic crisis or storm: Secondary | ICD-10-CM

## 2023-09-21 DIAGNOSIS — L659 Nonscarring hair loss, unspecified: Secondary | ICD-10-CM

## 2023-09-21 NOTE — Patient Instructions (Signed)
Please continue  methimazole 2.5 mg daily.  Please stop at the lab.  Please come back for a follow-up appointment in 1 year but for labs in 6 months.

## 2023-09-21 NOTE — Progress Notes (Signed)
Patient ID: Doris [name], female   DOB: 15-Jan-1946, 78 y.o.   MRN: [id number]  HPI  Doris [name] is a 78 y.o.-year-old very pleasant female, initially referred by her PCP, Tacey Ruiz, NP (Dr [facility]), presenting for follow-up for Graves ds. Last visit 1 year ago. New PCP - [facility]).  Interim history: No tremors,unintentional weight loss, heat intolerance. Occasional palpitations. She does have hair loss, which is chronic. She saw g-I since last OV. She had colitis. Now off gluten. Also off avocado, watermelon, milk - now drinks rice milk. She has occasional B leg weakness. She has low back pain.  Reviewed and addended history: She had a gastroenteritis on 10/25/2014 >> felt weak after this. She presented to [facility] for the weakness - TSH found low mid-March. She then had the TSH rechecked at a visit with PCP: TSH still low.   She had weakness in the past with her thyrotoxic episodes, including winter 2016 >> TFTs abnormal >> started MMI 5 mg daily in 10/2015 >> we were able to decrease the dose to 2.5 mg daily (5 mg qod) in 12/2015.   12/2017: TFTs were normal on 2.5 mg of methimazole daily.  01/2019: We stopped methimazole after a TSH returned slightly elevated  TFTs were normal afterwards until 08/2020. She has chronic sinusitis with frequent exacerbations.   A TSH was low x2 in 08/2020.  We started back on methimazole 2.5 mg daily (she takes 5 mg every other day).  However, in 03/2022, she came off methimazole again.   In 08/2022, we restarted MMI 2.5 mg daily (5 mg every other day).  Reviewed patient's TFTs: 08/10/2023: TSH 2.23 Lab Results  Component Value Date   TSH 2.78 03/19/2023   TSH 1.05 10/23/2022   TSH 0.12 (L) 09/18/2022   TSH 0.72 04/23/2022   TSH 5.08 03/18/2022   FREET4 0.69 03/19/2023   FREET4 0.60 10/23/2022   FREET4 0.85 09/18/2022   FREET4 0.82 04/23/2022   FREET4 0.69 03/18/2022  09/07/2020: TSH  0.173 (0.6-3.3) 04/06/2019: TSH 2.14, free T4 1.18, free T3 2.95, all normal 02/10/2019: TSH 6.6 11/21/2014: TSH 0.229 (0.450-4.5), TT4 11.9 (4.5-12)  11/04/2014: TSH 0.326 02/2014: TSH 2.530  Her TSI level was normal at last check: Lab Results  Component Value Date   TSI 108 01/11/2018    05/29/2015: Thyroid uptake and scan: The 24 hour radioactive iodine uptake is equal to 40.9%. There is uniform tracer uptake throughout both lobes. No dominant hot or cold nodule identified. IMPRESSION: 1. Elevated 24 hour radioactive uptake equal 40.9%. 2. Patient's hyperthyroidism may be due to Graves disease.  She saw Dr. Kirke Corin in the past for palpitations.  She was diagnosed with atrial fibrillation.  On Eliquis and Lopressor.  She also has pulmonary hypertension.  Pt denies: - feeling nodules in neck - hoarseness - dysphagia - choking  + Family history of thyroid cancer in daughter. No h/o radiation tx to head or neck. No recent contrast studies. No herbal supplements. No Biotin use. No recent steroids use.   ROS: + see HPI  I reviewed pt's medications, allergies, PMH, social hx, family hx, and changes were documented in the history of present illness. Otherwise, unchanged from my initial visit note.  Past Medical History:  Diagnosis Date   Colitis    Graves disease    Hyperlipidemia    Hypertension    Hyperthyroidism    PAF (paroxysmal atrial fibrillation) (HCC)  a. Dx 03/2019 -->AF RVR @ [facility]; b. 04/2019 Zio: <1% Afib burden; c. CHA2DS2VASc = 3-->Eliquis.   PAH (pulmonary artery hypertension) (HCC)    a. 05/2017 Echo: EF 60-65%, PASP ; b. 04/2019 Echo: EF 60-65% PASP ; c. 10/2021 Echo: EF 60-65%, no rwma, RVSP 42.38mmHg. Mild MR. Mod TR.   Palpitations    a. 05/2017 Echo: EF 60-65%; b. 05/2017 48h Holter: Rare PACs and PVCs. 11 beat run of SVT w/ aberrancy; c. 04/2019 Zio: Avg HR 56. 37 episodes of SVT, longest 19. Intermittent AF (<1% burden, longest 1:39). Rare PVCs.    Positive TB test    Valvular heart disease    a. 10/2021 Echo: Mild MR, Mod TR.   Past Surgical History:  Procedure Laterality Date   CESAREAN SECTION     x 2   EXCISION METACARPAL MASS Right 12/17/2017   Procedure: EXCISION METACARPAL MASS;  Surgeon: Kennedy Bucker, MD;  Location: [facility] ORS;  Service: Orthopedics;  Laterality: Right;    History   Social History   Marital Status: Married    Spouse Name: N/A   Number of Children: 5   Occupational History   Retired Runner, broadcasting/film/video   Social History Main Topics   Smoking status: Never Smoker    Smokeless tobacco: Not on file   Alcohol Use: No   Drug Use: No   Current Outpatient Medications on File Prior to Visit  Medication Sig Dispense Refill   acetaminophen (TYLENOL) 325 MG tablet Take 325 mg by mouth every 6 (six) hours as needed for headache.      amLODipine (NORVASC) 5 MG tablet Take 1 tablet (5 mg total) by mouth daily. 90 tablet 3   Carboxymethylcellul-Glycerin (LUBRICATING EYE DROPS OP) Place 2 drops into both eyes daily as needed (dry eyes).     Cholecalciferol (VITAMIN D3) 25 MCG (1000 UT) CAPS Take by mouth. Take 1 capsule (25 mcg total) by mouth daily.     dexamethasone (DECADRON) 0.5 MG/5ML solution as needed.     ELIQUIS 5 MG TABS tablet TAKE 1 TABLET BY MOUTH TWICE A DAY 180 tablet 1   losartan (COZAAR) 100 MG tablet Take 1 tablet (100 mg total) by mouth daily. 90 tablet 3   methimazole (TAPAZOLE) 5 MG tablet Take 0.5 tablets (2.5 mg total) by mouth daily. 45 tablet 3   metoprolol tartrate (LOPRESSOR) 25 MG tablet TAKE 1/2 TABLET(12.5 MG) BY MOUTH TWICE DAILY 90 tablet 3   Multiple Vitamin (MULTIVITAMIN WITH MINERALS) TABS tablet Take 1 tablet by mouth daily.     rosuvastatin (CRESTOR) 5 MG tablet Take 5 mg by mouth at bedtime.      spironolactone (ALDACTONE) 25 MG tablet Take 0.5 tablets (12.5 mg total) by mouth daily. 45 tablet 3   No current facility-administered medications on file prior to visit.   No Known Allergies    FH: - HTN in M - HL in M, sister - also see HPI  PE: BP 120/70   Pulse 71   Ht 5\' 3"  (1.6 m)   Wt 167 lb 3.2 oz (75.8 kg)   SpO2 98%   BMI 29.62 kg/m   Wt Readings from Last 10 Encounters:  09/21/23 167 lb 3.2 oz (75.8 kg)  09/17/23 165 lb 3.2 oz (74.9 kg)  09/11/23 166 lb (75.3 kg)  09/03/23 165 lb 4 oz (75 kg)  04/21/23 164 lb 2 oz (74.4 kg)  01/15/23 161 lb 12.8 oz (73.4 kg)  09/18/22 166 lb 9.6 oz (75.6 kg)  06/30/22 164 lb (74.4 kg)  02/24/22 162 lb 3.2 oz (73.6 kg)  11/22/21 161 lb (73 kg)   Constitutional: normal weight, in NAD Eyes:  EOMI, no exophthalmos ENT: no neck masses, no cervical lymphadenopathy Cardiovascular: RRR, No MRG Respiratory: CTA B Musculoskeletal: no deformities Skin:no rashes Neurological: no tremor with outstretched hands  ASSESSMENT: 1. Graves ds.   2. Hair loss  PLAN:  1.  -Patient with history of Graves' disease, with improved TFTs on methimazole, which we were then able to decrease and then stop completely 01/2019.  TFTs remains controlled for 1.5 years but then we had to restart it as TSH dropped lower than the lower limit of the target range (with normal free thyroid hormones).  We could continue a low dose, 2.5 mg daily, of methimazole afterwards, however, in 03/2022 she stopped methimazole completely again.  Her TFTs were normal until last visit, when TSH was low, at 0.12, while free thyroid hormones were normal.  At that time, I advised her to start back on the low-dose methimazole, 2.5 mg daily.  TFTs were normal afterwards. -At today's visit, she denies hot flashes, unintentional weight loss, anxiety, tremors -She continues on the beta-blocker: Lopressor 12.5 mg twice a day per cardiology.  She does have occasional palpitations. -We will recheck her TFTs and change the methimazole dose accordingly -At today's visit, we will recheck her TFTs and see if we need to restart methimazole -She prefers to be called with results, right  entraining MyChart -I will see her in 1 year, but in 6 months for labs  2. Hair loss -She wears a bandanna today -She contacted me since last visit with increased hair loss with a question whether this could be related to her thyroid condition. At that time and again today, we discussed that with normal thyroid tests, it is unlikely that the hair loss is due to Graves' disease.  We will check her TSI antibodies at today's visit to check Graves' disease activity. -We did discuss that she may need to have further investigation for the hair loss to include B12 and vitamin D levels, CBC and ferritin for iron deficiency anemia, etc. -She will contact PCP for further investigation.  Needs refills of MMI.  Orders Placed This Encounter  Procedures   TSH   T4, free   T3, free   Thyroid stimulating immunoglobulin   Carlus Pavlov, MD PhD [facility]

## 2023-09-23 ENCOUNTER — Encounter: Payer: Self-pay | Admitting: Internal Medicine

## 2023-09-23 LAB — T4, FREE: Free T4: 1.1 ng/dL (ref 0.8–1.8)

## 2023-09-23 LAB — T3, FREE: T3, Free: 2.8 pg/mL (ref 2.3–4.2)

## 2023-09-23 LAB — THYROID STIMULATING IMMUNOGLOBULIN: TSI: 203 % baseline — ABNORMAL HIGH (ref <140)

## 2023-09-23 LAB — TSH: TSH: 2.59 m[IU]/L (ref 0.40–4.50)

## 2023-09-23 MED ORDER — METHIMAZOLE 5 MG PO TABS: 5.0000 mg | ORAL_TABLET | ORAL | 3 refills | Status: DC

## 2023-09-23 NOTE — Addendum Note (Signed)
Addended by: Carlus Pavlov on: 09/23/2023 12:12 PM   Modules accepted: Orders

## 2023-09-30 ENCOUNTER — Telehealth: Payer: Self-pay | Admitting: Cardiovascular Disease

## 2023-09-30 NOTE — Telephone Encounter (Signed)
 The patient called stating she was seen in the ED on 09/28/23 for Afib RVR. She mentioned being instructed to follow up with her cardiologist and requested an appointment. The nurse offered to schedule her with Dr. Darron on 2/25 and Ryan on 2/11; however, the patient requested to be seen sooner. An appointment has been scheduled for tomorrow, 10/01/23, with [facility].

## 2023-09-30 NOTE — Telephone Encounter (Signed)
 Patient is asking that the nurse give her a call back. Please advise

## 2023-10-01 ENCOUNTER — Ambulatory Visit: Payer: Medicare PPO | Attending: Cardiology | Admitting: Cardiology

## 2023-10-01 ENCOUNTER — Encounter: Payer: Self-pay | Admitting: Cardiology

## 2023-10-01 VITALS — BP 122/62 | HR 75 | Ht 63.0 in | Wt 164.0 lb

## 2023-10-01 DIAGNOSIS — I10 Essential (primary) hypertension: Secondary | ICD-10-CM

## 2023-10-01 DIAGNOSIS — I272 Pulmonary hypertension, unspecified: Secondary | ICD-10-CM

## 2023-10-01 DIAGNOSIS — E782 Mixed hyperlipidemia: Secondary | ICD-10-CM

## 2023-10-01 DIAGNOSIS — E0590 Thyrotoxicosis, unspecified without thyrotoxic crisis or storm: Secondary | ICD-10-CM

## 2023-10-01 DIAGNOSIS — I480 Paroxysmal atrial fibrillation: Secondary | ICD-10-CM

## 2023-10-01 NOTE — Patient Instructions (Addendum)
 Medication Instructions:  No changes at this time.   *If you need a refill on your cardiac medications before your next appointment, please call your pharmacy*   Lab Work: None  If you have labs (blood work) drawn today and your tests are completely normal, you will receive your results only by: MyChart Message (if you have MyChart) OR A paper copy in the mail If you have any lab test that is abnormal or we need to change your treatment, we will call you to review the results.   Testing/Procedures: Referral placed to see Dr. Soyla Norton in [place] or [place]   Follow-Up: At [facility], you and your health needs are our priority.  As part of our continuing mission to provide you with exceptional heart care, we have created designated Provider Care Teams.  These Care Teams include your primary Cardiologist (physician) and Advanced Practice Providers (APPs -  Physician Assistants and Nurse Practitioners) who all work together to provide you with the care you need, when you need it.    Your next appointment:   6 month(s)  Provider:   Deatrice Cage, MD

## 2023-10-01 NOTE — Progress Notes (Signed)
 Established patient visit   Patient: Doris [name]   DOB: 06-08-1946   78 y.o. Female  MRN: [id number] Visit Date: 10/02/2023  Today's healthcare provider: Manuelita Flatness, PA-C   Cc. Ed f/u  Subjective     Pt was in the ED 09/28/23, presented in afib with rvr. Pt has since f/u with cardiology, on diltiazem  180 mg w/ metoprolol  12.5 mg prn. Pt does report brief palpitations for the past few days.  She has upcoming appt with EP.   Medications: Outpatient Medications Prior to Visit  Medication Sig   acetaminophen  (TYLENOL ) 325 MG tablet Take 325 mg by mouth every 6 (six) hours as needed for headache.    Carboxymethylcellul-Glycerin (LUBRICATING EYE DROPS OP) Place 2 drops into both eyes daily as needed (dry eyes).   Cholecalciferol (VITAMIN D3) 25 MCG (1000 UT) CAPS Take by mouth. Take 1 capsule (25 mcg total) by mouth daily.   dexamethasone (DECADRON) 0.5 MG/5ML solution as needed (sore gums).   diltiazem  (CARDIZEM  CD) 180 MG 24 hr capsule Take by mouth daily.   ELIQUIS  5 MG TABS tablet TAKE 1 TABLET BY MOUTH TWICE A DAY   losartan  (COZAAR ) 100 MG tablet Take 1 tablet (100 mg total) by mouth daily.   methimazole  (TAPAZOLE ) 5 MG tablet Take 1 tablet (5 mg total) by mouth every other day.   Multiple Vitamin (MULTIVITAMIN WITH MINERALS) TABS tablet Take 1 tablet by mouth daily.   rosuvastatin  (CRESTOR ) 5 MG tablet Take 5 mg by mouth at bedtime.    spironolactone  (ALDACTONE ) 25 MG tablet Take 0.5 tablets (12.5 mg total) by mouth daily.   No facility-administered medications prior to visit.    Review of Systems  Constitutional:  Negative for fatigue and fever.  Respiratory:  Negative for cough and shortness of breath.   Cardiovascular:  Positive for palpitations. Negative for chest pain and leg swelling.  Gastrointestinal:  Negative for abdominal pain.  Neurological:  Negative for dizziness and headaches.       Objective    BP 136/82   Pulse 63   Temp 97.7 F (36.5 C)  (Oral)   Ht 5' 3 (1.6 m)   Wt 164 lb 2 oz (74.4 kg)   SpO2 99%   BMI 29.07 kg/m    Physical Exam Constitutional:      General: She is awake.     Appearance: She is well-developed.  HENT:     Head: Normocephalic.  Eyes:     Conjunctiva/sclera: Conjunctivae normal.  Cardiovascular:     Rate and Rhythm: Normal rate and regular rhythm.     Heart sounds: Normal heart sounds.  Pulmonary:     Effort: Pulmonary effort is normal.  Skin:    General: Skin is warm.  Neurological:     Mental Status: She is alert and oriented to person, place, and time.  Psychiatric:        Attention and Perception: Attention normal.        Mood and Affect: Mood normal.        Speech: Speech normal.        Behavior: Behavior is cooperative.      No results found for any visits on 10/02/23.  Assessment & Plan    Atrial fibrillation with normal ventricular rate (HCC) Assessment & Plan: Now controlled with diltiazem  180 mg, w/ prn metoprolol  12.5. given addition of dilt, amlodipine  was d/c.  Pt has already been on eliquis  5 mg bid  Return if symptoms worsen or fail to improve.       Manuelita Flatness, PA-C  [facility] [phone] (phone) [phone] (fax)  [facility]

## 2023-10-01 NOTE — Progress Notes (Signed)
 Cardiology Office Note:  .   Date:  10/01/2023  ID:  Doris [name], DOB February 03, 1946, MRN [id number] PCP: Doris Shaver, PA-C  [facility] Providers Cardiologist:  Doris Cage, MD    History of Present Illness: .   Doris [name] is a 78 y.o. female with a past medical history of paroxysmal atrial fibrillation, mild pulmonary hypertension, essential hypertension, hyperlipidemia, who is here today for follow-up after recent emergency department visit related to her atrial fibrillation.  She previously been diagnosed with atrial fibrillation in August 2020 after she presented to [facility] emergency department with palpitations.  Heart rate was noted to be 169 bpm.  She been discharged on a small dose of metoprolol  and apixaban  and done well since that time.  Echocardiogram in September 2020 showed an EF of 60-65% with mild pulmonary hypertension.  She had also previously had issues with hyponatremia on her HCTZ was subsequently switched to spironolactone .  She did undergo an echocardiogram in February 2020 that showed normal LV systolic function, normal diastolic function, and improvement in pulmonary pressures with estimated systolic pulmonary pressure of 36 mmHg.  Most recent echocardiogram completed in April 2024 showed a normal LV systolic function, mild pulmonary hypertension with estimated systolic pressure 44.7 mmHg and moderate tricuspid regurgitation.  She was last seen in clinic 04/21/2023 by Dr.Arida.  At that time she had not been seen in the office for more than a year.  She continued to deny chest pain, shortness of breath, or palpitations.  Overall she has been doing well was continued on metoprolol  12.5 mg twice daily and tolerating anticoagulation with no issues with bleeding.  There was no further testing that was needed and she was to follow-up in a year.  Patient was evaluated in the emergency department by [facility] and was found to be in atrial fibrillation with RVR without clear  trigger.  She reported no change in medications no infectious symptoms or new toxic exposures.  ED workup revealed normal CMP, mag, and CBC, negative troponin and pro BNP, TSH mildly elevated 4.6 on admission, flu/COVID/RSV negative/chest x-ray was clear, physical examination was unremarkable.  She did not respond with IV metoprolol  x 1 so she was started on IV diltiazem  with improved rates.  She was started on 280 mg daily and transitioned off the drip.  Heart rates remained controlled after several hours of observation off of the drip.  Discussed option of further observation in the hospital but patient opted for discharge and close follow-up with a cardiologist.  She was advised that time to continue with diltiazem  180 mg daily, stop amlodipine  and losartan , continue spironolactone  and losartan .  She returns to clinic today stating that she has been doing well since her discharge from the emergency department.  She denies any chest pain shortness of breath or peripheral edema.  She continues to have some occasional palpitations primarily in the evening.  She stated that she had 1 episode that lasted an extended period of time but it finally resolved.  She has tolerated the diltiazem  thus far but this is only day 3 that she has been on the new medication.  She is requesting a referral to EP today.  She also had a follow-up recently with Dr. Tamea who scheduled her for an upcoming echocardiogram in July.  She states that she has been compliant with her medications.  She has not missed any doses of her apixaban  and denies any issues with bleeding with no blood noted in her urine  or stool.  ROS: 10 point review of system has been reviewed and considered negative except ones been listed in the HPI  Studies Reviewed: .       2D echo 12/19/22 1. Left ventricular ejection fraction, by estimation, is 55 to 60%. The  left ventricle has normal function. The left ventricle has no regional  wall motion  abnormalities. There is mild left ventricular hypertrophy.  Left ventricular diastolic parameters  are consistent with Grade I diastolic dysfunction (impaired relaxation).   2. Right ventricular systolic function is normal. The right ventricular  size is mildly enlarged. There is mildly elevated pulmonary artery  systolic pressure. The estimated right ventricular systolic pressure is  44.7 mmHg.   3. The mitral valve is normal in structure. Mild mitral valve  regurgitation. No evidence of mitral stenosis.   4. Tricuspid valve regurgitation is moderate.   5. The aortic valve is tricuspid. Aortic valve regurgitation is not  visualized. Aortic valve sclerosis is present, with no evidence of aortic  valve stenosis.   6. The inferior vena cava is normal in size with greater than 50%  respiratory variability, suggesting right atrial pressure of 3 mmHg.   2D echo 11/18/2021  1. Left ventricular ejection fraction, by estimation, is 60 to 65%. The  left ventricle has normal function. The left ventricle has no regional  wall motion abnormalities. Left ventricular diastolic parameters were  normal.   2. Right ventricular systolic function is normal. The right ventricular  size is normal. Mildly increased right ventricular wall thickness. There  is mildly elevated pulmonary artery systolic pressure. The estimated right  ventricular systolic pressure is  42.9 mmHg.   3. The mitral valve is normal in structure. Mild mitral valve  regurgitation. No evidence of mitral stenosis.   4. Tricuspid valve regurgitation is moderate.   5. The aortic valve is normal in structure. Aortic valve regurgitation is  not visualized. No aortic stenosis is present.   6. The inferior vena cava is normal in size with greater than 50%  respiratory variability, suggesting right atrial pressure of 3 mmHg.   Risk Assessment/Calculations:    CHA2DS2-VASc Score = 4   This indicates a 4.8% annual risk of stroke. The  patient's score is based upon: CHF History: 0 HTN History: 1 Diabetes History: 0 Stroke History: 0 Vascular Disease History: 0 Age Score: 2 Gender Score: 1            Physical Exam:   VS:  BP 122/62 (BP Location: Left Arm)   Pulse 75   Ht 5' 3 (1.6 m)   Wt 164 lb (74.4 kg)   SpO2 98%   BMI 29.05 kg/m    Wt Readings from Last 3 Encounters:  10/01/23 164 lb (74.4 kg)  09/21/23 167 lb 3.2 oz (75.8 kg)  09/17/23 165 lb 3.2 oz (74.9 kg)    GEN: Well nourished, well developed in no acute distress NECK: No JVD; No carotid bruits CARDIAC: RRR, no murmurs, rubs, gallops RESPIRATORY:  Clear to auscultation without rales, wheezing or rhonchi  ABDOMEN: Soft, non-tender, non-distended EXTREMITIES:  No edema; No deformity   ASSESSMENT AND PLAN: .   Paroxysmal atrial fibrillation with recent visit to the emergency department at [facility] and was found to be in atrial fibrillation RVR treated with IV diltiazem  and then transition to oral diltiazem  and was able to be discharged.  She is continued on apixaban  5 mg twice daily for CHA2DS2-VASc score of at least 4 for stroke  prophylaxis without side effects or issues for bleeding.  She is also concerned if her burst of palpitations last for an extended period of time and she was advised that she can take her metoprolol  to tartrate 25 mg half a tablet as needed for extended palpitations.  She has been referred back to EP with Dr. Inocencio for A-fib that she is able to see him in [facility].  Labs were from her ER visit and were unremarkable.  Hypertension blood pressure today is 122/62.  Overall she has had a stable blood pressures.  She is continued on losartan  100 mg daily, spironolactone  12.5 mg daily and diltiazem  180 mg daily.  She has been encouraged to continue to monitor her blood pressure 1 to 2 hours postmedication administration at home as well.  Hyperlipidemia last lipid profile was done in July 2023 with an LDL of 52.  She is due for an  updated lipid panel.  She is continued on rosuvastatin  5 mg.  Pulmonary hypertension That has been mild and stable on recent echocardiogram.  She has echocardiogram that is scheduled for July.  After further discussion we have decided that if EP needs a more updated study than that study can be moved up.  Hyperthyroidism where she has been continued on a small dose of methimazole  recent T3 and T4 were normal TSH was slightly elevated.       Dispo: Patient return to clinic to see MD/APP in 6 months or sooner if needed  Signed, Takiya Belmares, NP

## 2023-10-02 ENCOUNTER — Encounter: Payer: Self-pay | Admitting: Physician Assistant

## 2023-10-02 ENCOUNTER — Ambulatory Visit: Payer: Medicare PPO | Admitting: Physician Assistant

## 2023-10-02 VITALS — BP 136/82 | HR 63 | Temp 97.7°F | Ht 63.0 in | Wt 164.1 lb

## 2023-10-02 DIAGNOSIS — I4891 Unspecified atrial fibrillation: Secondary | ICD-10-CM

## 2023-10-02 NOTE — Assessment & Plan Note (Signed)
 Now controlled with diltiazem 180 mg, w/ prn metoprolol  12.5. given addition of dilt, amlodipine  was d/c.  Pt has already been on eliquis  5 mg bid

## 2023-10-09 ENCOUNTER — Telehealth: Payer: Self-pay | Admitting: Pulmonary Disease

## 2023-10-09 NOTE — Telephone Encounter (Signed)
Patient called requesting to speak with Doris [name], states it is in regards to an ECHO. Patient did not give any other info.

## 2023-10-09 NOTE — Telephone Encounter (Signed)
I spoke with the patient. She is ready to scheduled her Echo that was ordered in January.  Please call and schedule. Thank you!

## 2023-10-15 NOTE — Telephone Encounter (Signed)
I have spoke with Doris [name] and we are scheduling her echo on 12/21/23 @ 11:00 here at [facility]

## 2023-10-20 ENCOUNTER — Encounter: Payer: Self-pay | Admitting: Physician Assistant

## 2023-10-20 ENCOUNTER — Ambulatory Visit: Payer: Medicare PPO | Admitting: Physician Assistant

## 2023-10-20 VITALS — BP 149/79 | HR 67 | Temp 98.1°F | Resp 18 | Ht 63.0 in | Wt 162.2 lb

## 2023-10-20 DIAGNOSIS — R051 Acute cough: Secondary | ICD-10-CM | POA: Diagnosis not present

## 2023-10-20 DIAGNOSIS — R251 Tremor, unspecified: Secondary | ICD-10-CM

## 2023-10-20 MED ORDER — BENZONATATE 100 MG PO CAPS: 100.0000 mg | ORAL_CAPSULE | Freq: Two times a day (BID) | ORAL | 0 refills | Status: DC | PRN

## 2023-10-20 NOTE — Progress Notes (Signed)
 Established patient visit   Patient: STEPHENIE [name]   DOB: December 17, 1945   78 y.o. Female  MRN: [id number] Visit Date: 10/20/2023  Today's healthcare provider: Alfredia Ferguson, PA-C   Cc. Acute cough  Subjective     Pt reports with 6 days of a mildly productive cough w/ clear mucous. Denies headache, nasal congestion, sore throat. Denies chest pain, wheezing, SOB.  Using delsym otc.  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 Medications: Outpatient Medications Prior to Visit  Medication Sig   acetaminophen (TYLENOL) 325 MG tablet Take 325 mg by mouth every 6 (six) hours as needed for headache.    Carboxymethylcellul-Glycerin (LUBRICATING EYE DROPS OP) Place 2 drops into both eyes daily as needed (dry eyes).   Cholecalciferol (VITAMIN D3) 25 MCG (1000 UT) CAPS Take by mouth. Take 1 capsule (25 mcg total) by mouth daily.   dexamethasone (DECADRON) 0.5 MG/5ML solution as needed (sore gums).   diltiazem (CARDIZEM CD) 180 MG 24 hr capsule Take by mouth daily.   ELIQUIS 5 MG TABS tablet TAKE 1 TABLET BY MOUTH TWICE A DAY   losartan (COZAAR) 100 MG tablet Take 1 tablet (100 mg total) by mouth daily.   methimazole (TAPAZOLE) 5 MG tablet Take 1 tablet (5 mg total) by mouth every other day.   Multiple Vitamin (MULTIVITAMIN WITH MINERALS) TABS tablet Take 1 tablet by mouth daily.   rosuvastatin (CRESTOR) 5 MG tablet Take 5 mg by mouth at bedtime.    spironolactone (ALDACTONE) 25 MG tablet Take 0.5 tablets (12.5 mg total) by mouth daily.   No facility-administered medications prior to visit.    Review of Systems  Constitutional:  Negative for fatigue and fever.  Respiratory:  Positive for cough. Negative for shortness of breath.   Cardiovascular:  Negative for chest pain and leg swelling.  Gastrointestinal:  Negative for abdominal pain.  Neurological:  Negative for dizziness and headaches.       Objective    BP (!) 149/79 (BP Location:  Left Arm, Patient Position: Sitting, Cuff Size: Normal)   Pulse 67   Temp 98.1 F (36.7 C) (Oral)   Resp 18   Ht 5\' 3"  (1.6 m)   Wt 162 lb 3.2 oz (73.6 kg)   SpO2 100%   BMI 28.73 kg/m    Physical Exam Constitutional:      General: She is awake.     Appearance: She is well-developed.  HENT:     Head: Normocephalic.  Eyes:     Conjunctiva/sclera: Conjunctivae normal.  Cardiovascular:     Rate and Rhythm: Normal rate and regular rhythm.     Heart sounds: Normal heart sounds.  Pulmonary:     Effort: Pulmonary effort is normal.     Breath sounds: Normal breath sounds.  Skin:    General: Skin is warm.  Neurological:     Mental Status: She is alert and oriented to person, place, and time.  Psychiatric:        Attention and Perception: Attention normal.        Mood and Affect: Mood normal.        Speech: Speech normal.        Behavior: Behavior is cooperative.      No results found for any visits on 10/20/23.  Assessment & Plan    Acute cough -     Benzonatate; Take 1 capsule (100 mg total) by mouth 2 (two) times daily  as needed.  Dispense: 20 capsule; Refill: 0  Tremor  Lungs clear. Rx tesssalon. If cough persists/worsens to contact office. Cont antihistamines, hydration.   No tremor appreciated today. Advised if it persists after current illness improves, to message endo. Recent labs appear appropriately in range.  Return if symptoms worsen or fail to improve.       Alfredia Ferguson, PA-C  [facility] at [facility] [phone] (phone) [phone] (fax)  [facility]

## 2023-10-22 ENCOUNTER — Other Ambulatory Visit: Payer: Self-pay | Admitting: Physician Assistant

## 2023-10-22 ENCOUNTER — Telehealth: Payer: Self-pay | Admitting: Emergency Medicine

## 2023-10-22 MED ORDER — ALBUTEROL SULFATE HFA 108 (90 BASE) MCG/ACT IN AERS: 1.0000 | INHALATION_SPRAY | Freq: Four times a day (QID) | RESPIRATORY_TRACT | 2 refills | Status: DC | PRN

## 2023-10-22 NOTE — Telephone Encounter (Signed)
 Copied from CRM [id number]. Topic: Clinical - Medication Question >> Oct 22, 2023  3:31 PM Almira Coaster wrote: Reason for CRM: Patient was seen with Alfredia Ferguson on 10/20/2023, she forgot to ask for an albuterol inhaler 90 mcg. She states her previous provider requested for her to have. I advised patient that an appointment would be required. She states she was seen recently and Lillia Abed is aware she has a cough and wheezing.

## 2023-10-22 NOTE — Telephone Encounter (Signed)
 Spoke with patient.  Verbalized understanding and will be careful when she does use it

## 2023-10-26 ENCOUNTER — Telehealth: Payer: Self-pay

## 2023-10-26 NOTE — Telephone Encounter (Signed)
 Copied from CRM [id number]. Topic: General - Other >> Oct 26, 2023  8:23 AM Truddie Crumble wrote: Reason for CRM: patient stated she saw her doctor for a cough and she is still coughing and It has been going on for twelve days and she do not know what to do

## 2023-10-27 ENCOUNTER — Ambulatory Visit: Payer: Medicare PPO | Admitting: Cardiovascular Disease

## 2023-10-27 ENCOUNTER — Other Ambulatory Visit: Payer: Self-pay | Admitting: Physician Assistant

## 2023-10-27 DIAGNOSIS — R051 Acute cough: Secondary | ICD-10-CM

## 2023-10-28 ENCOUNTER — Encounter: Payer: Self-pay | Admitting: Physician Assistant

## 2023-10-28 ENCOUNTER — Ambulatory Visit: Admitting: Physician Assistant

## 2023-10-28 VITALS — BP 135/77 | HR 64 | Temp 98.1°F | Ht 63.0 in | Wt 160.0 lb

## 2023-10-28 DIAGNOSIS — R051 Acute cough: Secondary | ICD-10-CM | POA: Diagnosis not present

## 2023-10-28 DIAGNOSIS — J0100 Acute maxillary sinusitis, unspecified: Secondary | ICD-10-CM

## 2023-10-28 MED ORDER — AZITHROMYCIN 250 MG PO TABS: ORAL_TABLET | ORAL | 0 refills | Status: AC

## 2023-10-28 NOTE — Telephone Encounter (Signed)
 Patient was seen today.

## 2023-10-28 NOTE — Addendum Note (Signed)
 Addended byAlfredia Ferguson on: 10/28/2023 01:13 PM   Modules accepted: Orders

## 2023-10-28 NOTE — Progress Notes (Signed)
 Established patient visit   Patient: Doris [name]   DOB: [birth year]   78 y.o. Female  MRN: [id number] Visit Date: 10/28/2023  Today's healthcare provider: Alfredia Ferguson, PA-C   Chief Complaint  Patient presents with   Follow-up    Pt states she is "still coughing"   Subjective     Pt was last seen for cough 2/25, reports it is persistent and now significant nasal congestion, sinus pressure. Chills.  Medications: Outpatient Medications Prior to Visit  Medication Sig   acetaminophen (TYLENOL) 325 MG tablet Take 325 mg by mouth every 6 (six) hours as needed for headache.    albuterol (VENTOLIN HFA) 108 (90 Base) MCG/ACT inhaler Inhale 1 puff into the lungs every 6 (six) hours as needed for wheezing or shortness of breath.   benzonatate (TESSALON) 100 MG capsule Take 1 capsule (100 mg total) by mouth 2 (two) times daily as needed.   Carboxymethylcellul-Glycerin (LUBRICATING EYE DROPS OP) Place 2 drops into both eyes daily as needed (dry eyes).   Cholecalciferol (VITAMIN D3) 25 MCG (1000 UT) CAPS Take by mouth. Take 1 capsule (25 mcg total) by mouth daily.   dexamethasone (DECADRON) 0.5 MG/5ML solution as needed (sore gums).   diltiazem (CARDIZEM CD) 180 MG 24 hr capsule Take by mouth daily.   ELIQUIS 5 MG TABS tablet TAKE 1 TABLET BY MOUTH TWICE A DAY   losartan (COZAAR) 100 MG tablet Take 1 tablet (100 mg total) by mouth daily.   methimazole (TAPAZOLE) 5 MG tablet Take 1 tablet (5 mg total) by mouth every other day.   Multiple Vitamin (MULTIVITAMIN WITH MINERALS) TABS tablet Take 1 tablet by mouth daily.   rosuvastatin (CRESTOR) 5 MG tablet Take 5 mg by mouth at bedtime.    spironolactone (ALDACTONE) 25 MG tablet Take 0.5 tablets (12.5 mg total) by mouth daily.   No facility-administered medications prior to visit.    Review of Systems  Constitutional:  Positive for chills. Negative for fatigue and fever.  HENT:  Positive for congestion.   Respiratory:  Positive for  cough. Negative for shortness of breath.   Cardiovascular:  Negative for chest pain and leg swelling.  Gastrointestinal:  Negative for abdominal pain.  Neurological:  Positive for headaches. Negative for dizziness.       Objective    BP 135/77 (BP Location: Right Arm, Patient Position: Sitting, Cuff Size: Normal)   Pulse 64   Ht 5\' 3"  (1.6 m)   Wt 160 lb (72.6 kg)   SpO2 99%   BMI 28.34 kg/m    Physical Exam Constitutional:      General: She is awake.     Appearance: She is well-developed.  HENT:     Head: Normocephalic.  Eyes:     Conjunctiva/sclera: Conjunctivae normal.  Cardiovascular:     Rate and Rhythm: Normal rate.  Pulmonary:     Effort: Pulmonary effort is normal.     Breath sounds: Normal breath sounds.  Skin:    General: Skin is warm.  Neurological:     Mental Status: She is alert and oriented to person, place, and time.  Psychiatric:        Attention and Perception: Attention normal.        Mood and Affect: Mood normal.        Speech: Speech normal.        Behavior: Behavior is cooperative.     No results found for any visits on 10/28/23.  Assessment &  Plan    Acute non-recurrent maxillary sinusitis -     Azithromycin; Take 2 tablets on day 1, then 1 tablet daily on days 2 through 5  Dispense: 6 tablet; Refill: 0  Acute cough  Rx zpack, cover sinus and chest pathology, but lungs cta.  Cont fluids, tessalon  Return if symptoms worsen or fail to improve.       Alfredia Ferguson, PA-C  [facility] at [facility] [phone] (phone) [phone] (fax)  [facility]

## 2023-11-05 ENCOUNTER — Telehealth: Payer: Self-pay | Admitting: Internal Medicine

## 2023-11-05 DIAGNOSIS — E0500 Thyrotoxicosis with diffuse goiter without thyrotoxic crisis or storm: Secondary | ICD-10-CM

## 2023-11-05 NOTE — Telephone Encounter (Signed)
 Patient is calling to say that she is still having tremors and she wants to know if she should have labs done.  Patient states also that she went in to AFIB on 2/4/20205.

## 2023-11-06 ENCOUNTER — Telehealth: Payer: Self-pay | Admitting: Cardiovascular Disease

## 2023-11-06 NOTE — Telephone Encounter (Signed)
 LMTRC to see what day she wants to come in for labs.

## 2023-11-06 NOTE — Telephone Encounter (Signed)
 The patient called to report that she has occasionally been experiencing elevated blood pressure, which she notices more in the evening. The patient is unable to provide a log but noted that yesterday her systolic was 159 at the dentist's office. The patient also mentioned that she has been dealing with a cold and recently had a death in the family. A chart review showed the blood pressure at the PCP's office on 10/28/22 was 135/77. The nurse informed the patient that stress and a cold can sometimes contribute to elevated blood pressure.  The patient also stated that she has required PRN metoprolol (not listed on the medication list) on three occasions due to palpitations. The patient is scheduled for a new EP consult on 11/16/23 with Dr. Lalla Brothers.  Nurse recommended that the patient keep a daily log of her blood pressure readings and attend the scheduled appointment. The patient was also informed to bring the blood pressure log to the office visit for review. Patient verbalized understanding.  This information will be forwarded to the NP for any further recommendations.

## 2023-11-06 NOTE — Telephone Encounter (Signed)
Pt has been scheduled for Tuesday

## 2023-11-06 NOTE — Addendum Note (Signed)
 Addended by: Pollie Meyer on: 11/06/2023 02:23 PM   Modules accepted: Orders

## 2023-11-06 NOTE — Telephone Encounter (Signed)
 Pt c/o BP issue: STAT if pt c/o blurred vision, one-sided weakness or slurred speech.  STAT if BP is GREATER than 180/120 TODAY.  STAT if BP is LESS than 90/60 and SYMPTOMATIC TODAY  1. What is your BP concern?   BP trending high  2. Have you taken any BP medication today?  Yes  3. What are your last 5 BP readings?  Not available  4. Are you having any other symptoms (ex. Dizziness, headache, blurred vision, passed out)?   No   Patient is concerned her BP has been trending high.  Patient stated she will track her BP for the next few days.

## 2023-11-09 ENCOUNTER — Ambulatory Visit: Attending: Student | Admitting: Student

## 2023-11-09 ENCOUNTER — Telehealth: Payer: Self-pay | Admitting: Student

## 2023-11-09 ENCOUNTER — Encounter: Payer: Self-pay | Admitting: Student

## 2023-11-09 VITALS — BP 132/64 | HR 69 | Ht 63.0 in | Wt 161.8 lb

## 2023-11-09 DIAGNOSIS — I10 Essential (primary) hypertension: Secondary | ICD-10-CM

## 2023-11-09 DIAGNOSIS — I471 Supraventricular tachycardia, unspecified: Secondary | ICD-10-CM

## 2023-11-09 DIAGNOSIS — I480 Paroxysmal atrial fibrillation: Secondary | ICD-10-CM

## 2023-11-09 DIAGNOSIS — R002 Palpitations: Secondary | ICD-10-CM | POA: Diagnosis not present

## 2023-11-09 NOTE — Telephone Encounter (Signed)
 Concerns addressed at office visit today 11/09/23 with Carlos Levering, NP.   Furth, Cadence H, PA-C to Me     11/09/23  2:54 PM Continue to take BP at home and bring log to EP appointment. If still elevated then mention to EP doctor.

## 2023-11-09 NOTE — Telephone Encounter (Signed)
 Patient called to follow up on the previous message from last week. She reported experiencing palpitations for the past two weeks. She mentioned that she has been keeping a log of her blood pressure and heart rate as previously recommended but noted that she has had to take metoprolol tartrate 12.5 mg PRN when experiencing symptoms (medication not currently listed). The patient stated that she requested an appointment today to have someone review her readings and make any necessary medication adjustments. She also mentioned that she is currently asymptomatic. The nurse advised the patient to keep her appointment scheduled for today, 10/24/23, at 2:20 PM.

## 2023-11-09 NOTE — Progress Notes (Addendum)
 Cardiology Clinic Note   Date: 11/09/2023 ID: Zaiah, Credeur Nov 03, 1945, MRN [id number]  Primary Cardiologist:  Lorine Bears, MD  Chief Complaint   Doris [name] is a 78 y.o. female who presents to the clinic today for evaluation of elevated blood pressure.   Patient Profile   Doris [name] is followed by Dr. Kirke Corin for the history outlined below.      Past medical history significant for: PAF. Onset August 2020. 14-day ZIO 04/12/2019: HR 48 to 222 bpm, average 56 bpm.  Predominantly sinus rhythm.  37 episodes of SVT longest 19 beats.  Intermittent A-fib <1% longest 1 minute 39 seconds.  Rare PVCs. Pulmonary hypertension. Echo 12/19/2022: EF 55 to 60%.  No RWMA.  Mild LVH.  Grade I DD.  Normal RV function, mild RVH.  Mildly elevated PA pressure, RVSP 44.7 mmHg.  Mild MR.  Moderate TR.  Aortic valve sclerosis without stenosis. Hypertension. Hyperlipidemia. Lipid panel 03/14/2022: LDL 52, HDL 56, TG 95, total 127. Graves' disease.  In summary, patient established care with Dr. [place] S[place] in February 2014 for evaluation and management of hypertension.  In late September/early October 2018 she reported episodes of palpitations.  She wore a 48-hour Holter which showed an 11 beat run of SVT with aberrancy.  Echo at that time showed EF 60 to 65%, no RWMA, Grade I DD, PA pressure at the upper limit of normal, RVSP 38 mmHg.  She declined beta-blocker at that time.  Patient was evaluated at [facility] ED on 04/08/2019 with worsening palpitations and was found to be in A-fib with RVR, 169 bpm.  Chest x-ray was normal and labs were unrevealing.  She was treated with Cardizem in the ED without improvement.  She was then given metoprolol and heart rate decreased to 100-1 130s.  She was started on metoprolol 12.5 twice daily along with Eliquis.  She followed up with our practice 4 days later and reported continued intermittent palpitations.  14-day ZIO demonstrated 37 episodes of SVT and intermittent A-fib as  detailed above.  Patient was evaluated by Dr. Graciela Husbands in September 2021. Given she was asymptomatic, no further intervention was pursued. Patient is followed by pulmonology for pulmonary hypertension. Echo April 2024 demonstrated mildly elevated PA pressure as detailed above.   Patient was last seen in the office by Charlsie Quest, NP on 10/01/2023 for hospital follow-up.  Patient was evaluated in the ED at [facility] several days prior to her visit.  She was found to be in A-fib with RVR without a clear trigger.  ED workup revealed normal CMP, Mg, CBC, proBNP.  Troponin was negative.  TSH was mildly elevated at 4.6.  Respiratory panel was negative for flu, COVID, RSV and chest x-ray without acute findings.  She did not respond to IV metoprolol x 1 so she was started on IV diltiazem with improved rates.  She was transition to p.o. diltiazem 280 mg and heart rate remained controlled over several hours of observation without the diltiazem drip.  She was discharged on diltiazem 180 mg daily and instructed to stop amlodipine and continue spironolactone and losartan.  At the time of her office visit she was doing well with occasional palpitations primarily occurring in the evening and 1 episode that lasted an extended period before resolving.  Patient was instructed to take extra dose of metoprolol for extended palpitations.  She was referred back to EP with Dr. Elberta Fortis.  Patient contacted the office on 11/09/2023 with reports of increased palpitations x 2  weeks.  Per triage RN "She reported experiencing palpitations for the past two weeks. She mentioned that she has been keeping a log of her blood pressure and heart rate as previously recommended but noted that she has had to take metoprolol tartrate 12.5 mg PRN when experiencing symptoms (medication not currently listed). The patient stated that she requested an appointment today to have someone review her readings and make any necessary medication adjustments. She also  mentioned that she is currently asymptomatic. The nurse advised the patient to keep her appointment scheduled for today, 10/24/23, at 2:20 PM."     History of Present Illness    Today, patient reports episodes of elevated BP that occur at night and are followed by palpitations. She reports a total of 7 episodes starting on 2/25. It does not happen every night. She states she begins to feel "off" and knows her BP is elevated she will then feel her heart race with an irregular rhythm. She is not sure how long the episodes last. She does not check her BP or heart rate during the episodes "because that is what sent me to the hospital last time. I just know if I check it I will freak out." She has been taking metoprolol tartrate 12.5 mg for the episodes with resolution of symptoms "immediately." She wants to make sure she is safe to continue to take metoprolol as needed. She denies chest pain, pressure or tightness. She has been under an increased amount of stress. She recently lost 2 members of her family. She was also sick with a cough and URI symptoms since the beginning of February. She was not taking OTC decongestants to treat her symptoms just plain delsym cough medication and then tessalon pearls and antibiotic prescribed by PCP. She also reports some recent dental work with a second course of antibiotic. Prior to the last month or so she was active walking and using a row machine for exercise.     ROS: All other systems reviewed and are otherwise negative except as noted in History of Present Illness.  EKGs/Labs Reviewed    EKG Interpretation Date/Time:  Monday November 09 2023 14:38:13 EDT Ventricular Rate:  69 PR Interval:  122 QRS Duration:  72 QT Interval:  386 QTC Calculation: 413 R Axis:   -10  Text Interpretation: Sinus rhythm with Premature atrial complexes When compared with ECG of 21-Apr-2023 14:37, Premature atrial complexes are now Present Confirmed by Carlos Levering [phone]) on  11/09/2023 2:48:29 PM    09/21/2023: TSH 2.59    Risk Assessment/Calculations     CHA2DS2-VASc Score = 4   This indicates a 4.8% annual risk of stroke. The patient's score is based upon: CHF History: 0 HTN History: 1 Diabetes History: 0 Stroke History: 0 Vascular Disease History: 0 Age Score: 2 Gender Score: 1             Physical Exam    VS:  BP 132/64 (BP Location: Left Arm, Patient Position: Sitting, Cuff Size: Normal)   Pulse 69   Ht 5\' 3"  (1.6 m)   Wt 161 lb 12.8 oz (73.4 kg)   SpO2 100%   BMI 28.66 kg/m  , BMI Body mass index is 28.66 kg/m.  GEN: Well nourished, well developed, in no acute distress. Neck: No JVD or carotid bruits. Cardiac:  RRR. 1/6 systolic murmur. No rubs or gallops.   Respiratory:  Respirations regular and unlabored. Clear to auscultation without rales, wheezing or rhonchi. GI: Soft, nontender, nondistended. Extremities:  Radials/DP/PT 2+ and equal bilaterally. No clubbing or cyanosis. No edema.  Skin: Warm and dry, no rash. Neuro: Strength intact.  Assessment & Plan   Palpitations/SVT/PAF 48-hour Holter October 2018 showed 11 beat run of SVT with aberrancy.  Onset of A-fib August 2020.  14-day ZIO demonstrated 37 episodes of SVT and intermittent A-fib.  Recent ED visit at [facility] for A-fib with RVR February 2025.  Heart rate was controlled by IV diltiazem.  She was discharged on p.o. diltiazem.  Denies spontaneous bleeding concerns.  Patient reports 7 episodes of elevated BP and palpitations since 2/25. She does not check her BP or HR during these episodes. She has been taking a dose of metoprolol 12.5 mg for them with resolution "immediately." She wants to be sure it is safe to keep taking the medication as needed. EKG today shows sinus rhythm with PACs.  -Continue diltiazem, Eliquis prn metoprolol. -Keep scheduled follow-up with EP 3/24.  Hypertension BP today 150/60 on intake and 132/64 on my recheck. She denies headaches, dizziness or vision  changes. She states feeling "off" when she thinks her BP is elevated but has not checked BP to verify.  -Recommended checking BP during episodes of feeling "off."  -Continue spironolactone, losartan, diltiazem, as needed metoprolol.  Disposition: Keep scheduled visit with EP on 3/24. Return sooner as needed.          Signed, Etta Grandchild. Braydin Aloi, DNP, NP-C

## 2023-11-09 NOTE — Telephone Encounter (Signed)
 Patient c/o Palpitations:  STAT if patient reporting lightheadedness, shortness of breath, or chest pain  How long have you had palpitations/irregular HR/ Afib? Are you having the symptoms now? Afib On/off x 2wks, no  Are you currently experiencing lightheadedness, SOB or CP? no  Do you have a history of afib (atrial fibrillation) or irregular heart rhythm? yes  Have you checked your BP or HR? (document readings if available): No  Are you experiencing any other symptoms? No Pt requested appt, today @ 2:20 W/ D. [place]

## 2023-11-09 NOTE — Patient Instructions (Signed)
 Medication Instructions:  Your Physician recommend you continue on your current medication as directed.    *If you need a refill on your cardiac medications before your next appointment, please call your pharmacy*   Lab Work: None ordered at this time    Follow-Up: At [facility], you and your health needs are our priority.  As part of our continuing mission to provide you with exceptional heart care, we have created designated Provider Care Teams.  These Care Teams include your primary Cardiologist (physician) and Advanced Practice Providers (APPs -  Physician Assistants and Nurse Practitioners) who all work together to provide you with the care you need, when you need it.   Your next appointment:   With Dr Elberta Fortis

## 2023-11-10 ENCOUNTER — Other Ambulatory Visit

## 2023-11-11 ENCOUNTER — Encounter: Payer: Self-pay | Admitting: Internal Medicine

## 2023-11-11 LAB — TSH: TSH: 2.37 m[IU]/L (ref 0.40–4.50)

## 2023-11-11 LAB — T4, FREE: Free T4: 1.2 ng/dL (ref 0.8–1.8)

## 2023-11-11 LAB — T3, FREE: T3, Free: 3.1 pg/mL (ref 2.3–4.2)

## 2023-11-16 ENCOUNTER — Encounter: Payer: Self-pay | Admitting: Cardiology

## 2023-11-16 ENCOUNTER — Ambulatory Visit: Payer: Medicare PPO | Attending: Cardiology | Admitting: Cardiology

## 2023-11-16 VITALS — BP 128/62 | HR 62 | Ht 63.0 in | Wt 161.8 lb

## 2023-11-16 DIAGNOSIS — D6869 Other thrombophilia: Secondary | ICD-10-CM

## 2023-11-16 DIAGNOSIS — I471 Supraventricular tachycardia, unspecified: Secondary | ICD-10-CM

## 2023-11-16 DIAGNOSIS — I10 Essential (primary) hypertension: Secondary | ICD-10-CM

## 2023-11-16 DIAGNOSIS — I480 Paroxysmal atrial fibrillation: Secondary | ICD-10-CM

## 2023-11-16 MED ORDER — FLECAINIDE ACETATE 50 MG PO TABS: 50.0000 mg | ORAL_TABLET | Freq: Two times a day (BID) | ORAL | 3 refills | Status: DC

## 2023-11-16 NOTE — Progress Notes (Signed)
  Electrophysiology Office Note:   Date:  11/16/2023  ID:  [name], [name] [birth year], MRN [id number]  Primary Cardiologist: Lorine Bears, MD Primary Heart Failure: None Electrophysiologist: None      History of Present Illness:   Doris [name] is a 78 y.o. female with h/o atrial fibrillation, pulmonary hypertension, hypertension, hyperlipidemia, Graves' disease seen today for  for Electrophysiology evaluation of atrial fibrillation at the request of Whitney Muse born.    Atrial fibrillation was diagnosed in 2020.  She was in the [facility] emergency room with heart rates of 169 bpm and symptoms of worsening palpitations.  She had a hospitalization 10/01/2023 at [facility].  She was in atrial fibrillation at the time.  Respiratory panel was negative for viral illness.  She was started on metoprolol and diltiazem with improved heart rates.    Today, denies symptoms of palpitations, chest pain, shortness of breath, orthopnea, PND, lower extremity edema, claudication, dizziness, presyncope, syncope, bleeding, or neurologic sequela. The patient is tolerating medications without difficulties.  She continues to have episodic palpitations.  There are no exacerbating or alleviating factors.  She thinks that this is atrial fibrillation when she is having these palpitations.  She would prefer a rhythm control strategy.  Review of systems complete and found to be negative unless listed in HPI.   EP Information / Studies Reviewed:    EKG is not ordered today. EKG from 11/09/2023 reviewed which showed sinus rhythm        Risk Assessment/Calculations:    CHA2DS2-VASc Score = 4   This indicates a 4.8% annual risk of stroke. The patient's score is based upon: CHF History: 0 HTN History: 1 Diabetes History: 0 Stroke History: 0 Vascular Disease History: 0 Age Score: 2 Gender Score: 1             Physical Exam:   VS:  BP 128/62   Pulse 62   Ht 5\' 3"  (1.6 m)   Wt 161 lb 12.8 oz (73.4 kg)   SpO2  99%   BMI 28.66 kg/m    Wt Readings from Last 3 Encounters:  11/16/23 161 lb 12.8 oz (73.4 kg)  11/09/23 161 lb 12.8 oz (73.4 kg)  10/28/23 160 lb (72.6 kg)     GEN: Well nourished, well developed in no acute distress NECK: No JVD; No carotid bruits CARDIAC: Regular rate and rhythm, no murmurs, rubs, gallops RESPIRATORY:  Clear to auscultation without rales, wheezing or rhonchi  ABDOMEN: Soft, non-tender, non-distended EXTREMITIES:  No edema; No deformity   ASSESSMENT AND PLAN:    1.  Paroxysmal atrial fibrillation: Has had multiple episodes of atrial fibrillation.  She would prefer a rhythm control strategy.  We discussed ablation versus medication management.  At this point, she would prefer to avoid procedures if this can be controlled with medications.  Due to that, we Lucile Hillmann start flecainide 50 mg twice daily.  She Tay Whitwell have an EKG in 2 weeks.  If flecainide fails, she Treena Cosman be amenable to ablation.  2.  SVT: Monitor in 2020 with no episode longer than 19 beats.  Continue with current management.  Minimally symptomatic  3.  Hypertension: Currently well-controlled  4.  Secondary to coagula state: Currently on Eliquis 5 mg twice daily for atrial fibrillation  Follow up with Dr. Elberta Fortis in 6 months  Signed, Eura Mccauslin Jorja Loa, MD

## 2023-11-16 NOTE — Patient Instructions (Signed)
 Medication Instructions:  Your physician has recommended you make the following change in your medication:  START Flecainide 50 mg twice daily  *If you need a refill on your cardiac medications before your next appointment, please call your pharmacy*   Lab Work: None ordered  If you have any lab test that is abnormal or we need to change your treatment, we will call you to review the results.   Testing/Procedures: None ordered   Follow-Up: At [facility], you and your health needs are our priority.  As part of our continuing mission to provide you with exceptional heart care, we have created designated Provider Care Teams.  These Care Teams include your primary Cardiologist (physician) and Advanced Practice Providers (APPs -  Physician Assistants and Nurse Practitioners) who all work together to provide you with the care you need, when you need it.  Your physician recommends that you schedule a follow-up appointment in: 2 weeks for nurse visit EKG   Your next appointment:   6 month(s)  The format for your next appointment:   In Person  Provider:   Loman Brooklyn, MD    Thank you for choosing [name]!!   Dory Horn, RN [phone]  Other Instructions  Flecainide Tablets What is this medication? FLECAINIDE (FLEK a nide) prevents and treats a fast or irregular heartbeat (arrhythmia). It is often used to treat a type of arrhythmia known as AFib (atrial fibrillation). It works by slowing down overactive electric signals in the heart, which stabilizes your heart rhythm. It belongs to a group of medications called antiarrhythmics. This medicine may be used for other purposes; ask your health care provider or pharmacist if you have questions. COMMON BRAND NAME(S): Tambocor What should I tell my care team before I take this medication? They need to know if you have any of these conditions: High or low levels of potassium in the blood Heart disease including heart  rhythm and heart rate problems Kidney disease Liver disease Recent heart attack An unusual or allergic reaction to flecainide, other medications, foods, dyes, or preservatives Pregnant or trying to get pregnant Breastfeeding How should I use this medication? Take this medication by mouth with a glass of water. Take it as directed on the prescription label at the same time every day. You can take it with or without food. If it upsets your stomach, take it with food. Do not take your medication more often than directed. Do not stop taking this medication suddenly. This may cause serious, heart-related side effects. If your care team wants you to stop the medication, the dose may be slowly lowered over time to avoid any side effects. Talk to your care team about the use of this medication in children. While it may be prescribed for children as young as 1 year for selected conditions, precautions do apply. Overdosage: If you think you have taken too much of this medicine contact a poison control center or emergency room at once. NOTE: This medicine is only for you. Do not share this medicine with others. What if I miss a dose? If you miss a dose, take it as soon as you can. If it is almost time for your next dose, take only that dose. Do not take double or extra doses. What may interact with this medication? Do not take this medication with any of the following: Amoxapine Arsenic trioxide Certain antibiotics, such as clarithromycin, erythromycin, gatifloxacin, gemifloxacin, levofloxacin, moxifloxacin, sparfloxacin, or troleandomycin Certain antidepressants, called tricyclic antidepressants such as  amitriptyline, imipramine, or nortriptyline Certain medications for irregular heartbeat, such as disopyramide, encainide, moricizine, procainamide, propafenone, and quinidine Cisapride Delavirdine Droperidol Haloperidol [place]orn Imatinib Levomethadyl Maprotiline Medications for malaria, such as  chloroquine and halofantrine Pentamidine Phenothiazines, such as chlorpromazine, mesoridazine, prochlorperazine, thioridazine Pimozide Quinine Ranolazine Ritonavir Sertindole This medication may also interact with the following: Cimetidine Dofetilide Medications for angina or blood pressure Medications for irregular heartbeat, such as amiodarone and digoxin Ziprasidone This list may not describe all possible interactions. Give your health care provider a list of all the medicines, herbs, non-prescription drugs, or dietary supplements you use. Also tell them if you smoke, drink alcohol, or use illegal drugs. Some items may interact with your medicine. What should I watch for while using this medication? Visit your care team for regular checks on your progress. Because your condition and the use of this medication carries some risk, it is a good idea to carry an identification card, necklace, or bracelet with details of your condition, medications, and care team. Check your blood pressure and pulse rate as directed. Know what your blood pressure and pulse rate should be and when tod contact your care team. Your care team may schedule regular blood tests and electrocardiograms to check your progress. This medication may affect your coordination, reaction time, or judgment. Do not drive or operate machinery until you know how this medication affects you. Sit up or stand slowly to reduce the risk of dizzy or fainting spells. Drinking alcohol with this medication can increase the risk of these side effects. What side effects may I notice from receiving this medication? Side effects that you should report to your care team as soon as possible: Allergic reactions--skin rash, itching, hives, swelling of the face, lips, tongue, or throat Heart failure--shortness of breath, swelling of the ankles, feet, or hands, sudden weight gain, unusual weakness or fatigue Heart rhythm changes--fast or irregular  heartbeat, dizziness, feeling faint or lightheaded, chest pain, trouble breathing Liver injury--right upper belly pain, loss of appetite, nausea, light-colored stool, dark yellow or brown urine, yellowing skin or eyes, unusual weakness or fatigue Side effects that usually do not require medical attention (report to your care team if they continue or are bothersome): Blurry vision Constipation Dizziness Fatigue Headache Nausea Tremors or shaking This list may not describe all possible side effects. Call your doctor for medical advice about side effects. You may report side effects to FDA at 1-800-FDA-1088. Where should I keep my medication? Keep out of the reach of children and pets. Store at room temperature between 15 and 30 degrees C (59 and 86 degrees F). Protect from light. Keep container tightly closed. Throw away any unused medication after the expiration date. NOTE: This sheet is a summary. It may not cover all possible information. If you have questions about this medicine, talk to your doctor, pharmacist, or health care provider.  2024 Elsevier/Gold Standard (2022-03-19 00:00:00)

## 2023-11-19 ENCOUNTER — Telehealth: Payer: Self-pay | Admitting: Cardiovascular Disease

## 2023-11-19 NOTE — Telephone Encounter (Signed)
 Returned the call to the patient. She stated that she has been having elevated blood pressures in the evenings and was advised to call back.   Medications: Diltiazem 180 mg once daily Flecainide 50 mg once daily Losartan 100 mg once daily Spironolactone 12.5 mg once daily  Her blood pressure and heart rate today at 4 pm was 153/72, HR 64. On the 25 th,  it was 161/88 and HR was 77.

## 2023-11-19 NOTE — Telephone Encounter (Signed)
 Calling to speak to the nurse about her medication. Please advise

## 2023-11-20 ENCOUNTER — Other Ambulatory Visit (HOSPITAL_COMMUNITY): Payer: Self-pay

## 2023-11-20 MED ORDER — SPIRONOLACTONE 25 MG PO TABS
25.0000 mg | ORAL_TABLET | Freq: Every day | ORAL | 3 refills | Status: DC
Start: 2023-11-20 — End: 2023-12-22
  Filled 2023-11-20: qty 30, 30d supply, fill #0

## 2023-11-20 NOTE — Telephone Encounter (Signed)
 Spoke with patient and she reports blood pressures are increased and one reading was 168/87. Reviewed recommendations by Carlos Levering, NP. Discussed medication changes in detail. Confirmed nurse visit and reviewed instructed her to please bring in her blood pressure cuff to that visit. Also instructed her to keep a log of her readings and bring to that appointment. She verbalized understanding of our conversation, medication changes, appointment information, and had no further questions at this time.   Patients BP check & EKG are being done in [place]. Will reach out to their office to review this information with them as well.   Carlos Levering, NP  Ursula Alert, RN2 hours ago (1:29 PM)    Will you please call this patient and ask her to increase spironolactone to 25 mg and change her diltiazem to taking it in the evening. She should continue logging her BP. I would like her to have her BP checked at the nurse visit she is scheduled for on 4/7. Ask her to bring her home BP cuff with her to that visit so we can correlate her readings.  Thank you!  DW

## 2023-11-20 NOTE — Telephone Encounter (Signed)
 Patient is following up requesting feedback this afternoon if at all possible.

## 2023-11-23 ENCOUNTER — Other Ambulatory Visit (HOSPITAL_COMMUNITY): Payer: Self-pay

## 2023-11-24 NOTE — Telephone Encounter (Signed)
 noted

## 2023-11-25 ENCOUNTER — Telehealth: Payer: Self-pay | Admitting: Cardiology

## 2023-11-25 NOTE — Telephone Encounter (Signed)
 Pt c/o medication issue:  1. Name of Medication: Flecainide- she started  on this medicine , she thinks on 11-18-23   2. How are you currently taking this medication (dosage and times per day)?    3. Are you having a reaction (difficulty breathing--STAT)?   4. What is your medication issue? Patient says her blood pressure is running high in the evening

## 2023-11-25 NOTE — Telephone Encounter (Signed)
 Pt advised to discuss her BP control with Dr. Jari Sportsman office. Offered to send to them for her but she said she would send a mychart message to Carlos Levering, NP to discuss further.  She appreciates my return call.

## 2023-11-28 ENCOUNTER — Ambulatory Visit
Admission: RE | Admit: 2023-11-28 | Discharge: 2023-11-28 | Disposition: A | Discharge status: Home / Self Care | Source: Ambulatory Visit | Attending: Physician Assistant | Admitting: Physician Assistant

## 2023-11-28 VITALS — BP 164/80 | HR 71 | Temp 98.0°F

## 2023-11-28 DIAGNOSIS — I10 Essential (primary) hypertension: Secondary | ICD-10-CM

## 2023-11-28 NOTE — ED Triage Notes (Signed)
 Pt states she has changed BP meds recently and feels like over the last week it has been high. She has checked at home and kept a log, reports 150s/80s usually. States she has reached out to cardiology and has not heard back but has follow up appt scheduled for 12/02/23.

## 2023-11-28 NOTE — Discharge Instructions (Addendum)
 Advised patient to take blood pressure in the morning first thing after using bathroom and prior to eating breakfast.  Advised patient to log measurements for the next 10 days so that cardiology may be able understanding daily blood pressure trends more closely.  Encouraged increase daily water intake to 64 ounces per day while taking these medications.  Advised if symptoms worsen and/or unresolved please follow-up with your PCP or here for further evaluation.

## 2023-11-28 NOTE — ED Provider Notes (Signed)
 [facility]    CSN: [id number] Arrival date & time: 11/28/23  1317      History   Chief Complaint Chief Complaint  Patient presents with   Hypertension    Entered by patient    HPI Doris [name] is a 78 y.o. female.   HPI pleasant 78 year old female presents with concerns of hypertension.  PMH significant for paroxysmal atrial fibrillation (currently on apixaban twice daily and denies any unusual bleeding), pulmonary artery hypertension, and HTN.  Per chart review patient has had changes in her recent blood pressure medication from cardiology on 11/20/2023, increasing spironolactone to 25 mg and changing diltiazem morning dose to taking medication in the evening.  Additionally, patient reports starting flecainide on 11/16/2023 per cardiology.  Patient reports blood pressure was 152/81 this morning at 1030 at her home.  Past Medical History:  Diagnosis Date   Colitis    Graves disease    Hyperlipidemia    Hypertension    Hyperthyroidism    PAF (paroxysmal atrial fibrillation) (HCC)    a. Dx 03/2019 -->AF RVR @ [facility]; b. 04/2019 Zio: <1% Afib burden; c. CHA2DS2VASc = 3-->Eliquis.   PAH (pulmonary artery hypertension) (HCC)    a. 05/2017 Echo: EF 60-65%, PASP ; b. 04/2019 Echo: EF 60-65% PASP ; c. 10/2021 Echo: EF 60-65%, no rwma, RVSP 42.49mmHg. Mild MR. Mod TR.   Palpitations    a. 05/2017 Echo: EF 60-65%; b. 05/2017 48h Holter: Rare PACs and PVCs. 11 beat run of SVT w/ aberrancy; c. 04/2019 Zio: Avg HR 56. 37 episodes of SVT, longest 19. Intermittent AF (<1% burden, longest 1:39). Rare PVCs.   Positive TB test    Valvular heart disease    a. 10/2021 Echo: Mild MR, Mod TR.    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      Home Medications    Prior to Admission medications   Medication Sig Start Date End Date Taking? Authorizing Provider  acetaminophen (TYLENOL) 325 MG tablet Take 325 mg by mouth every 6 (six) hours as needed for headache.     [provider]  albuterol (VENTOLIN HFA) 108 (90 Base) MCG/ACT inhaler Inhale 1 puff into the lungs every 6 (six) hours as needed for wheezing or shortness of breath. 10/22/23   Alfredia Ferguson, PA-C  benzonatate (TESSALON) 100 MG capsule Take 1 capsule (100 mg total) by mouth 2 (two) times daily as needed. 10/20/23   Alfredia Ferguson, PA-C  Carboxymethylcellul-Glycerin (LUBRICATING EYE DROPS OP) Place 2 drops into both eyes daily as needed (dry eyes).    [provider]  Cholecalciferol (VITAMIN D3) 25 MCG (1000 UT) CAPS Take by mouth. Take 1 capsule (25 mcg total) by mouth daily.    [provider]  dexamethasone (DECADRON) 0.5 MG/5ML solution as needed (sore gums). 08/04/23  [provider]  diltiazem (CARDIZEM CD) 180 MG 24 hr capsule Take 180 mg by mouth every evening. 09/29/23 09/28/24  [provider]  ELIQUIS 5 MG TABS tablet TAKE 1 TABLET BY MOUTH TWICE A DAY 07/20/23   Iran Ouch, MD  flecainide (TAMBOCOR) 50 MG tablet Take 1 tablet (50 mg total) by mouth 2 (two) times daily. 11/16/23   Camnitz, Andree Coss, MD  losartan (COZAAR) 100 MG tablet Take 1 tablet (100 mg total) by mouth daily. 04/21/23   Iran Ouch, MD  methimazole (TAPAZOLE) 5 MG tablet Take 1 tablet (5 mg total) by mouth every other day. 09/23/23    Carlus Pavlov, MD  Multiple Vitamin (MULTIVITAMIN WITH MINERALS) TABS tablet Take 1 tablet by mouth daily.    [provider]  rosuvastatin (CRESTOR) 5 MG tablet Take 5 mg by mouth at bedtime.  04/23/17 11/23/30  [provider]  spironolactone (ALDACTONE) 25 MG tablet Take 1 tablet (25 mg total) by mouth daily. 11/20/23   Carlos Levering, NP    Family History Family History  Problem Relation Age of Onset   Breast cancer Mother 78   Throat cancer Father    Thyroid cancer Daughter    Colon cancer Neg Hx     Social History Social History   Tobacco Use   Smoking status: Never   Smokeless tobacco: Never  Vaping Use   Vaping status: Never Used  Substance Use Topics   Alcohol use: No    Alcohol/week: 0.0 standard drinks of alcohol   Drug use: No     Allergies   Patient has no known allergies.   Review of Systems Review of Systems  Cardiovascular:        Blood pressure concerns for 7 days     Physical Exam Triage Vital Signs ED Triage Vitals  Encounter Vitals Group     BP      Systolic BP Percentile      Diastolic BP Percentile      Pulse      Resp      Temp      Temp src      SpO2      Weight      Height      Head Circumference      Peak Flow      Pain Score      Pain Loc      Pain Education      Exclude from Growth Chart    No data found.  Updated Vital Signs BP (!) 164/80 (BP Location: Right Arm)   Pulse 71   Temp 98 F (36.7 C) (Oral)   SpO2 98%      Physical Exam Vitals and nursing note reviewed.  Constitutional:      General: She is not in acute distress.    Appearance: Normal appearance. She is normal weight. She is not ill-appearing, toxic-appearing or diaphoretic.  HENT:     Head: Normocephalic and atraumatic.     Mouth/Throat:     Mouth: Mucous membranes are moist.     Pharynx: Oropharynx is clear.  Eyes:     Extraocular Movements: Extraocular movements intact.     Conjunctiva/sclera: Conjunctivae normal.      Pupils: Pupils are equal, round, and reactive to light.  Cardiovascular:     Rate and Rhythm: Normal rate and regular rhythm.     Pulses: Normal pulses.     Heart sounds: Normal heart sounds. No murmur  heard.    No friction rub. No gallop.  Pulmonary:     Effort: Pulmonary effort is normal.     Breath sounds: Normal breath sounds. No wheezing, rhonchi or rales.  Musculoskeletal:        General: Normal range of motion.     Cervical back: Normal range of motion and neck supple.  Skin:    General: Skin is warm and dry.  Neurological:     General: No focal deficit present.     Mental Status: She is alert and oriented to person, place, and time. Mental status is at baseline.  Psychiatric:        Mood and Affect: Mood normal.        Behavior: Behavior normal.        Thought Content: Thought content normal.      UC Treatments / Results  Labs (all labs ordered are listed, but only abnormal results are displayed) Labs Reviewed - No data to display  EKG   Radiology No results found.  Procedures Procedures (including critical care time)  Medications Ordered in UC Medications - No data to display  Initial Impression / Assessment and Plan / UC Course  I have reviewed the triage vital signs and the nursing notes.  Pertinent labs & imaging results that were available during my care of the patient were reviewed by me and considered in my medical decision making (see chart for details).     MDM: 1.  Hypertension, unspecified-164/80 in clinic today with no symptoms. Advised patient to take blood pressure in the morning first thing after using bathroom and prior to eating breakfast.  Advised patient to log measurements for the next 10 days so that cardiology may be able understanding daily blood pressure trends more closely.  Encouraged increase daily water intake to 64 ounces per day while taking these medications.  Advised if symptoms worsen and/or unresolved please follow-up  with your PCP or here for further evaluation.  Patient discharged home, hemodynamically stable. Final Clinical Impressions(s) / UC Diagnoses   Final diagnoses:  Hypertension, unspecified type     Discharge Instructions      Advised patient to take blood pressure in the morning first thing after using bathroom and prior to eating breakfast.  Advised patient to log measurements for the next 10 days so that cardiology may be able understanding daily blood pressure trends more closely.  Encouraged increase daily water intake to 64 ounces per day while taking these medications.  Advised if symptoms worsen and/or unresolved please follow-up with your PCP or here for further evaluation.     ED Prescriptions   None    PDMP not reviewed this encounter.   Trevor Iha, FNP 11/28/23 1407

## 2023-11-30 ENCOUNTER — Ambulatory Visit

## 2023-12-02 ENCOUNTER — Ambulatory Visit: Attending: Cardiology

## 2023-12-02 VITALS — BP 120/72 | HR 64 | Resp 20

## 2023-12-02 DIAGNOSIS — I480 Paroxysmal atrial fibrillation: Secondary | ICD-10-CM

## 2023-12-02 NOTE — Progress Notes (Signed)
   Nurse Visit   Date of Encounter: 12/02/2023 ID: Doris [name], DOB 02/18/1946, MRN [id number]  PCP:  Doris Ferguson, PA-C   [facility] Providers Cardiologist:  Doris Bears, MD      Visit Details   VS:  BP 120/72 (BP Location: Right Arm, Patient Position: Sitting, Cuff Size: Normal)   Pulse 64   Resp 20  , BMI There is no height or weight on file to calculate BMI.  Wt Readings from Last 3 Encounters:  11/16/23 161 lb 12.8 oz (73.4 kg)  11/09/23 161 lb 12.8 oz (73.4 kg)  10/28/23 160 lb (72.6 kg)     Reason for visit: Perform EKG and compare her blood pressure machine readings to our manual blood pressure readings. Performed today: Vitals, EKG, Provider consulted and Education Changes (medications, testing, etc.) : No new orders at this time Length of Visit: 25 minutes    Medications Adjustments/Labs and Tests Ordered: Orders Placed This Encounter  Procedures   EKG 12-Lead   No orders of the defined types were placed in this encounter.    Signed, Doris Frederic, RN  12/02/2023 4:35 PM

## 2023-12-08 ENCOUNTER — Telehealth: Payer: Self-pay | Admitting: Cardiovascular Disease

## 2023-12-08 NOTE — Telephone Encounter (Incomplete)
   Pre-operative Risk Assessment    Patient Name: Doris [name]  DOB: [birth year] MRN: [id number] {HEARTCARE STAFF-IMPORTANT INSTRUCTIONS 1 Red and Blue Text will auto delete once note is signed or closed. Do not manually delete brackets, colon or the number one. 2 Press F2 to navigate through template.   3 On drop down lists, L click to select >> R click to activate next field 4 Reason for Visit format is IMPORTANT!!  See Directions on No. 2 below. 5 Please review chart to determine if there is already a clearance note open for this procedure!!  DO NOT duplicate if a note already exists!!  6 Document below the last office encounter and the next scheduled encounter. This can be done through the appointment desk or with a chart review.   :1}  Date of last office visit: 11/16/23 Date of next office visit: n/a { Dental Requests If request is for dental extraction, please clarify the number of teeth to be extracted and place under No 1 below.   If the patient is currently at the dentist's office, send SecureChat message to Pre-Op Callback Staff (CMA/nurse) to input urgent request.   If the patient is not currently in the dentist's office, please route to the Pre-Op pool.              :1} Request for Surgical Clearance {1. What type of surgery is being performed? Enter name of procedure below and number of teeth if dental extraction.  :1}  n/a Procedure:  Any in the future Dental Cleaning, fillings, extractions {2. When is this surgery scheduled? Press F2 to enter date below and place date in Reason for Visit (see directions below). :1} Date of Surgery:  Clearance TBD                             {For convenience, highlight and copy (CTL+C) the Clearance MM/DD/YY phrase above. Click here to go to Reason for Visit.  Paste (CTL+V) the date.  Engineer, drilling.  Then click button underneath called Add Clearance MM/DD/YY as free text.     :1} {3. What is the name of the Surgeon, the Surgeon's Group or  Practice, phone and fax number?  Press F2 and list below :1}  Surgeon:  not indicated Surgeon's Group or Practice Name:  [facility] Phone number:  [phone] Fax number:  [phone] {4. What type of clearance is requested?  Medical or Cardiac Clearance only?  Pharmacy Clearance Only (Request is to hold medication only)?  Or Both?  Press F2 and select the clearance requested.  If both are needed, select both from the drop down list.     :1}  Type of Clearance Requested: Eliquis   {5. What type of anesthesia will be used?  Press F2 and select the anesthesia to be used for the procedure.  :1}  Type of Anesthesia:  local {6. Are there any other requests or questions from the surgeon?    :1} no  Additional requests/questions:  {Select additional requests/questions or use asterisks to free text (Optional):[id number]}  Signed, Fletcher Humble   12/08/2023, 3:44 PM

## 2023-12-08 NOTE — Telephone Encounter (Signed)
 Spoke with Kaylee at Dentist office and per Hygienist who spoke with someone already and patient is not needed to stop any medications Per Surgeon office this was just a protocol to be ahead.

## 2023-12-21 ENCOUNTER — Ambulatory Visit
Admission: RE | Admit: 2023-12-21 | Discharge: 2023-12-21 | Disposition: A | Discharge status: Home / Self Care | Payer: Medicare PPO | Source: Ambulatory Visit | Attending: Pulmonary Disease | Admitting: Pulmonary Disease

## 2023-12-21 ENCOUNTER — Telehealth: Payer: Self-pay | Admitting: Cardiovascular Disease

## 2023-12-21 DIAGNOSIS — I272 Pulmonary hypertension, unspecified: Secondary | ICD-10-CM

## 2023-12-21 NOTE — Telephone Encounter (Signed)
*  STAT* If patient is at the pharmacy, call can be transferred to refill team.   1. Which medications need to be refilled? (please list name of each medication and dose if known)   spironolactone  (ALDACTONE ) 25 MG tablet Take 1 tablet (25 mg total) by mouth daily.     2. Which pharmacy/location (including street and city if local pharmacy) is medication to be sent to? [id number] [place] - [address] Phone: [phone]  Fax: [phone]   3. Do they need a 30 day or 90 day supply? 90 Was sent to [facility]

## 2023-12-22 MED ORDER — SPIRONOLACTONE 25 MG PO TABS
25.0000 mg | ORAL_TABLET | Freq: Every day | ORAL | 0 refills | Status: DC
Start: 2023-12-22 — End: 2024-03-18

## 2023-12-24 ENCOUNTER — Other Ambulatory Visit: Payer: Self-pay | Admitting: Physician Assistant

## 2023-12-24 ENCOUNTER — Telehealth: Payer: Self-pay | Admitting: Emergency Medicine

## 2023-12-24 DIAGNOSIS — R739 Hyperglycemia, unspecified: Secondary | ICD-10-CM

## 2023-12-24 DIAGNOSIS — I10 Essential (primary) hypertension: Secondary | ICD-10-CM

## 2023-12-24 NOTE — Telephone Encounter (Signed)
 Copied from CRM [id number]. Topic: Clinical - Request for Lab/Test Order >> Dec 24, 2023 11:08 AM Luane Rumps D wrote: Reason for CRM: Patient says she is due for labs but there isn't an order currently. Wondering if Trenton Frock, PA can look through and order the labs she is due. Only lab she can think of is for kidneys, but would like Heidi Llamas to tell her what other labs she needs. Would like an update to schedule labs when order is in.

## 2023-12-25 ENCOUNTER — Telehealth: Payer: Self-pay

## 2023-12-25 NOTE — Telephone Encounter (Signed)
 Copied from CRM [id number]. Topic: Clinical - Request for Lab/Test Order >> Dec 24, 2023 11:08 AM Doris [name] wrote: Reason for CRM: Patient says she is due for labs but there isn't an order currently. Wondering if Doris Frock, Doris [name] can look through and order the labs she is due. Only lab she can think of is for kidneys, but would like Doris [name] to tell her what other labs she needs. Would like an update to schedule labs when order is in.

## 2023-12-25 NOTE — Telephone Encounter (Signed)
 ERROR---- telephone note already in chart

## 2023-12-26 ENCOUNTER — Other Ambulatory Visit: Payer: Self-pay | Admitting: Physician Assistant

## 2023-12-31 ENCOUNTER — Telehealth: Payer: Self-pay | Admitting: Cardiovascular Disease

## 2023-12-31 NOTE — Telephone Encounter (Signed)
Called patient, LVM to call back to discuss.  Left call back number.   

## 2023-12-31 NOTE — Telephone Encounter (Signed)
 Patient called stating she went to [facility] on 12/28/23. Patient requested for us  to receive her records. Patient was having issues with her bp and was scheduled for a hospital f/u on 01/07/24. Please advise.

## 2024-01-01 NOTE — Telephone Encounter (Signed)
 Called patient, made aware that she is coming in on 05/15- she will take her blood pressure and heart rate and will bring log with her to her follow up. She states her follow up is just to go over medications and make sure everything is okay for her to take.   Advised to continue to log BP/HR and bring to visit.  Patient verbalized understanding, made aware of upcoming visit.

## 2024-01-05 ENCOUNTER — Encounter: Payer: Self-pay | Admitting: Physician Assistant

## 2024-01-05 ENCOUNTER — Ambulatory Visit: Admitting: Physician Assistant

## 2024-01-05 VITALS — BP 131/76 | HR 68 | Ht 63.0 in | Wt 161.0 lb

## 2024-01-05 DIAGNOSIS — I4891 Unspecified atrial fibrillation: Secondary | ICD-10-CM | POA: Diagnosis not present

## 2024-01-05 DIAGNOSIS — I10 Essential (primary) hypertension: Secondary | ICD-10-CM

## 2024-01-05 NOTE — Assessment & Plan Note (Signed)
 Chronic, labile, controlled today. Now manages with lostartan 100 mg ,sprinolactone 12.5 mg, diltiazem 180 mg.  Reassured pt some increases in blood pressure that are temporary are okay, low risk of converting her to afib.

## 2024-01-05 NOTE — Assessment & Plan Note (Signed)
 On dilt and now flecanide. Flecainide  may be contributing to LE weakness, advise she discuss at her upcoming cardiology appt.

## 2024-01-05 NOTE — Progress Notes (Signed)
 Established patient visit   Patient: Doris [name]   DOB: 08-Mar-1946   78 y.o. Female  MRN: [id number] Visit Date: 01/05/2024  Today's healthcare provider: Trenton Frock, PA-C   Cc. Htn, urgent care f/u  Subjective     Discussed the use of AI scribe software for clinical note transcription with the patient, who gave verbal consent to proceed.  History of Present Illness   Doris [name] is a 78 year old female with atrial fibrillation and hypertension who presents with concerns about weakness and blood pressure fluctuations.  She experiences weakness and blood pressure fluctuations, which she associates with starting flecainide  twice daily. Her blood pressure occasionally reaches 160 mmHg, higher than her usual levels.   She was recently in the ED, [facility], unable to see records, and urgent care before this, for elevated blood pressure. She was concerned the elevated pressure would put her back in afib.   She feels weak in her lower extremities over the last month. She has not been as physically active in recent years but has started walking for 15 minutes and riding her bike. She has noticed a change in her energy levels over the past month.      Medications: Outpatient Medications Prior to Visit  Medication Sig   acetaminophen  (TYLENOL ) 325 MG tablet Take 325 mg by mouth every 6 (six) hours as needed for headache.    albuterol  (VENTOLIN  HFA) 108 (90 Base) MCG/ACT inhaler Inhale 1 puff into the lungs every 6 (six) hours as needed for wheezing or shortness of breath.   Carboxymethylcellul-Glycerin (LUBRICATING EYE DROPS OP) Place 2 drops into both eyes daily as needed (dry eyes).   Cholecalciferol (VITAMIN D3) 25 MCG (1000 UT) CAPS Take by mouth. Take 1 capsule (25 mcg total) by mouth daily.   dexamethasone (DECADRON) 0.5 MG/5ML solution as needed (sore gums).   diltiazem (CARDIZEM CD) 180 MG 24 hr capsule Take 180 mg by mouth every evening.   ELIQUIS  5 MG TABS  tablet TAKE 1 TABLET BY MOUTH TWICE A DAY   flecainide  (TAMBOCOR ) 50 MG tablet Take 1 tablet (50 mg total) by mouth 2 (two) times daily.   losartan  (COZAAR ) 100 MG tablet Take 1 tablet (100 mg total) by mouth daily.   methimazole  (TAPAZOLE ) 5 MG tablet Take 1 tablet (5 mg total) by mouth every other day.   Multiple Vitamin (MULTIVITAMIN WITH MINERALS) TABS tablet Take 1 tablet by mouth daily.   rosuvastatin (CRESTOR) 5 MG tablet TAKE 1 TABLET BY MOUTH EVERY DAY   spironolactone  (ALDACTONE ) 25 MG tablet Take 1 tablet (25 mg total) by mouth daily.   benzonatate  (TESSALON ) 100 MG capsule Take 1 capsule (100 mg total) by mouth 2 (two) times daily as needed. (Patient not taking: Reported on 01/05/2024)   No facility-administered medications prior to visit.    Review of Systems  Constitutional:  Negative for fatigue and fever.  Respiratory:  Negative for cough and shortness of breath.   Cardiovascular:  Negative for chest pain and leg swelling.  Gastrointestinal:  Negative for abdominal pain.  Neurological:  Negative for dizziness and headaches.       Objective    BP 131/76   Pulse 68   Ht 5\' 3"  (1.6 m)   Wt 161 lb (73 kg)   SpO2 100%   BMI 28.52 kg/m    Physical Exam Constitutional:      General: She is awake.     Appearance: She is  well-developed.  HENT:     Head: Normocephalic.  Eyes:     Conjunctiva/sclera: Conjunctivae normal.  Cardiovascular:     Rate and Rhythm: Normal rate and regular rhythm.     Heart sounds: Normal heart sounds.  Pulmonary:     Effort: Pulmonary effort is normal.  Skin:    General: Skin is warm.  Neurological:     Mental Status: She is alert and oriented to person, place, and time.  Psychiatric:        Attention and Perception: Attention normal.        Mood and Affect: Mood normal.        Speech: Speech normal.        Behavior: Behavior is cooperative.      No results found for any visits on 01/05/24.  Assessment & Plan    Primary  hypertension Assessment & Plan: Chronic, labile, controlled today. Now manages with lostartan 100 mg ,sprinolactone 12.5 mg, diltiazem 180 mg.  Reassured pt some increases in blood pressure that are temporary are okay, low risk of converting her to afib.     Atrial fibrillation with normal ventricular rate (HCC) Assessment & Plan: On dilt and now flecanide. Flecainide  may be contributing to LE weakness, advise she discuss at her upcoming cardiology appt.    Return if symptoms worsen or fail to improve.       Trenton Frock, PA-C  [facility] at [facility] [phone] (phone) [phone] (fax)  [facility]

## 2024-01-07 ENCOUNTER — Ambulatory Visit

## 2024-01-07 ENCOUNTER — Ambulatory Visit: Attending: Nurse Practitioner | Admitting: Nurse Practitioner

## 2024-01-07 ENCOUNTER — Encounter: Payer: Self-pay | Admitting: Nurse Practitioner

## 2024-01-07 VITALS — BP 130/68 | HR 65 | Ht 63.0 in | Wt 162.6 lb

## 2024-01-07 DIAGNOSIS — I471 Supraventricular tachycardia, unspecified: Secondary | ICD-10-CM

## 2024-01-07 DIAGNOSIS — I480 Paroxysmal atrial fibrillation: Secondary | ICD-10-CM | POA: Diagnosis not present

## 2024-01-07 DIAGNOSIS — R531 Weakness: Secondary | ICD-10-CM | POA: Diagnosis not present

## 2024-01-07 DIAGNOSIS — I272 Pulmonary hypertension, unspecified: Secondary | ICD-10-CM

## 2024-01-07 DIAGNOSIS — I10 Essential (primary) hypertension: Secondary | ICD-10-CM

## 2024-01-07 DIAGNOSIS — R5383 Other fatigue: Secondary | ICD-10-CM | POA: Diagnosis not present

## 2024-01-07 DIAGNOSIS — R55 Syncope and collapse: Secondary | ICD-10-CM

## 2024-01-07 NOTE — Progress Notes (Signed)
 Office Visit    Patient Name: Doris [name] Date of Encounter: 01/07/2024  Primary Care Provider:  Trenton Frock, PA-C Primary Cardiologist:  Antionette Kirks, MD  Chief Complaint    78 y.o. female with a history of paroxysmal atrial fibrillation, PSVT, pulmonary hypertension, essential hypertension, hyperlipidemia, and hyperthyroidism, who presents for follow-up paroxysmal A-fib and weak spells.  Past Medical History   Subjective   Past Medical History:  Diagnosis Date   Colitis    Graves disease    Hyperlipidemia    Hypertension    Hyperthyroidism    PAF (paroxysmal atrial fibrillation) (HCC)    a. Dx 03/2019 -->AF RVR @ [facility]; b. 04/2019 Zio: <1% Afib burden; c. CHA2DS2VASc = 4-->Eliquis .   PAH (pulmonary artery hypertension) (HCC)    a. 05/2017 Echo: EF 60-65%, PASP ; b. 04/2019 Echo: EF 60-65% PASP ; c. 10/2021 Echo: EF 60-65%, no rwma, RVSP 42.87mmHg. Mild MR. Mod TR; d. 11/2023 Echo: EF 55-60%, no rwma, mild LVH, GrI DD, nl RV fxn, RVSP 44.7 mmHg. Mild MR. Mod TR.   Palpitations    a. 05/2017 Echo: EF 60-65%; b. 05/2017 48h Holter: Rare PACs and PVCs. 11 beat run of SVT w/ aberrancy; c. 04/2019 Zio: Avg HR 56. 37 episodes of SVT, longest 19. Intermittent AF (<1% burden, longest 1:39). Rare PVCs.   Positive TB test    Valvular heart disease    a. 10/2021 Echo: Mild MR, Mod TR.   Past Surgical History:  Procedure Laterality Date   CESAREAN SECTION     x 2   EXCISION METACARPAL MASS Right 12/17/2017   Procedure: EXCISION METACARPAL MASS;  Surgeon: Molli Angelucci, MD;  Location: [facility] ORS;  Service: Orthopedics;  Laterality: Right;    Allergies  No Known Allergies     History of Present Illness      78 y.o. y/o female with the above past medical history including paroxysmal atrial fibrillation, PSVT, pulmonary hypertension, primary hypertension, hyperlipidemia, and hyperthyroidism.  She was previously evaluated in late 2018 for palpitations with monitoring at  that time showing rare PACs and PVCs and an 11 beat run of SVT with aberrancy. Echocardiogram in October 2018 showed normal LV function with grade 1 diastolic dysfunction, as well as pulmonary hypertension with a PASP of 38 mmHg. She was diagnosed with atrial fibrillation following presentation to [facility] in August 2020. Subsequent monitoring showed an A-fib burden of less than 1% with the longest episode lasting 1 minute and 39 seconds. 37 runs of SVT were also noted. She has been managed with beta-blocker and Eliquis  therapy. In the setting of pulmonary hypertension, she has been followed by pulmonology, and has been wearing oxygen at night.   In February 2025, patient was evaluated at the [facility] emergency department in the setting of A-fib with RVR without clear trigger.  ED workup was relatively unremarkable.  TSH was mildly elevated at 4.6.  Rate improved with IV diltiazem and she was subsequently transitioned to p.o. diltiazem.  She follow-up with electrophysiology in March 2025 and was placed on flecainide  50 mg twice daily.  She preferred to avoid an invasive procedure.  Follow-up ECG on April 9 showed stable intervals.  Echo in late April 2025 showed EF of 55 to 60% with grade 1 diastolic dysfunction, RVSP of 54.0 mmHg, mild MR, and moderate TR.   Patient notes that over the past several months, and likely preceding initiation of flecainide  therapy, that she will periodically have sudden onset of weakness  and fatigue associated with GI discomfort which she describes as having a nervous stomach as opposed to pain, nausea, or vomiting.  The weakness and fatigue will often abate within 10 minutes but GI discomfort could last longer.  She is not aware of any palpitations and denies any chest pain or dyspnea.  She just has sudden stopping of energy based on her description.  This can occur at any time and often occurs while sitting.  She says it occurred while waiting to come into our office today.   She denies PND, orthopnea, dizziness, syncope, edema, or early satiety.  Symptoms do seem to be more likely to occur since being placed on flecainide . Objective   Home Medications    Current Outpatient Medications  Medication Sig Dispense Refill   acetaminophen  (TYLENOL ) 325 MG tablet Take 325 mg by mouth every 6 (six) hours as needed for headache.      albuterol  (VENTOLIN  HFA) 108 (90 Base) MCG/ACT inhaler Inhale 1 puff into the lungs every 6 (six) hours as needed for wheezing or shortness of breath. 8 g 2   benzonatate  (TESSALON ) 100 MG capsule Take 1 capsule (100 mg total) by mouth 2 (two) times daily as needed. 20 capsule 0   Carboxymethylcellul-Glycerin (LUBRICATING EYE DROPS OP) Place 2 drops into both eyes daily as needed (dry eyes).     Cholecalciferol (VITAMIN D3) 25 MCG (1000 UT) CAPS Take by mouth. Take 1 capsule (25 mcg total) by mouth daily.     dexamethasone (DECADRON) 0.5 MG/5ML solution as needed (sore gums).     diltiazem (CARDIZEM CD) 180 MG 24 hr capsule Take 180 mg by mouth every evening.     ELIQUIS  5 MG TABS tablet TAKE 1 TABLET BY MOUTH TWICE A DAY 180 tablet 1   flecainide  (TAMBOCOR ) 50 MG tablet Take 1 tablet (50 mg total) by mouth 2 (two) times daily. 60 tablet 3   losartan  (COZAAR ) 100 MG tablet Take 1 tablet (100 mg total) by mouth daily. 90 tablet 3   methimazole  (TAPAZOLE ) 5 MG tablet Take 1 tablet (5 mg total) by mouth every other day. 45 tablet 3   Multiple Vitamin (MULTIVITAMIN WITH MINERALS) TABS tablet Take 1 tablet by mouth daily.     rosuvastatin (CRESTOR) 5 MG tablet TAKE 1 TABLET BY MOUTH EVERY DAY 90 tablet 0   spironolactone  (ALDACTONE ) 25 MG tablet Take 1 tablet (25 mg total) by mouth daily. 90 tablet 0   No current facility-administered medications for this visit.     Physical Exam    VS:  BP 130/68 (BP Location: Left Arm, Patient Position: Sitting, Cuff Size: Normal)   Pulse 65   Ht 5\' 3"  (1.6 m)   Wt 162 lb 9.6 oz (73.8 kg)   SpO2 99%   BMI  28.80 kg/m  , BMI Body mass index is 28.8 kg/m.       GEN: Well nourished, well developed, in no acute distress. HEENT: normal. Neck: Supple, no JVD, carotid bruits, or masses. Cardiac: RRR, 2/6 systolic murmur at the upper sternal borders.  No rubs or gallops. No clubbing, cyanosis, edema.  Radials 2+/PT 2+ and equal bilaterally.  Respiratory:  Respirations regular and unlabored, clear to auscultation bilaterally. GI: Soft, nontender, nondistended, BS + x 4. MS: no deformity or atrophy. Skin: warm and dry, no rash. Neuro:  Strength and sensation are intact. Psych: Normal affect.  Accessory Clinical Findings    ECG personally reviewed by me today - EKG Interpretation Date/Time:  Thursday Jan 07 2024 14:26:32 EDT Ventricular Rate:  65 PR Interval:  148 QRS Duration:  80 QT Interval:  396 QTC Calculation: 411 R Axis:   -17  Text Interpretation: Normal sinus rhythm Normal ECG Confirmed by Laneta Pintos [phone]) on 01/07/2024 2:36:00 PM   - no acute changes.  Lab Results  Component Value Date   TSH 2.37 11/10/2023       Assessment & Plan    1.  Weakness/fatigue: Patient with episodic and sudden weakness and fatigue episodes that are often associated with GI symptoms that she describes as having a nervous stomach.  She denies nausea, vomiting, or abdominal pain.  The former symptoms will typically last about 10 minutes but the GI symptoms can last an entire day.  She thinks that the symptoms preceded use of flecainide  but have become more frequent since then.  For now, she would like to continue flecainide  as it has helped significantly with palpitations and A-fib.  We agreed to place an event monitor for 1 week to whether or not arrhythmias might be contributing to symptoms.  2.  Paroxysmal atrial fibrillation: She is unaware of any recurrence of atrial fibrillation.  She remains on flecainide  therapy.  ECG with normal intervals.  Follow-up ETT in the setting of flecainide   therapy.  She remains anticoagulated with Eliquis  (CHA2DS2-VASc equals 4).  Continue diltiazem.  3.  Primary hypertension: Blood pressure stable today at 130/68.  She remains on diltiazem, losartan , and spironolactone .  4.  Hyperlipidemia: On statin.  5.  Pulmonary hypertension: Denies dyspnea today.  Euvolemic on examination.  Recent echo with RVSP of 44.7 mmHg.  Continue spironolactone .  6.  Disposition: Follow-up ZIO monitor and ETT.  Follow-up in clinic in 1 month or sooner if necessary.  Laneta Pintos, NP 01/07/2024, 5:59 PM

## 2024-01-07 NOTE — Patient Instructions (Signed)
 Medication Instructions:  Your Physician recommend you continue on your current medication as directed.    *If you need a refill on your cardiac medications before your next appointment, please call your pharmacy*  Testing/Procedures: Your physician has recommended that you wear a Zio monitor.   This monitor is a medical device that records the heart's electrical activity. Doctors most often use these monitors to diagnose arrhythmias. Arrhythmias are problems with the speed or rhythm of the heartbeat. The monitor is a small device applied to your chest. You can wear one while you do your normal daily activities. While wearing this monitor if you have any symptoms to push the button and record what you felt. Once you have worn this monitor for the period of time provider prescribed (Usually 14 days), you will return the monitor device in the postage paid box. Once it is returned they will download the data collected and provide us  with a report which the provider will then review and we will call you with those results. Important tips:  Avoid showering during the first 24 hours of wearing the monitor. Avoid excessive sweating to help maximize wear time. Do not submerge the device, no hot tubs, and no swimming pools. Keep any lotions or oils away from the patch. After 24 hours you may shower with the patch on. Take brief showers with your back facing the shower head.  Do not remove patch once it has been placed because that will interrupt data and decrease adhesive wear time. Push the button when you have any symptoms and write down what you were feeling. Once you have completed wearing your monitor, remove and place into box which has postage paid and place in your outgoing mailbox.  If for some reason you have misplaced your box then call our office and we can provide another box and/or mail it off for you.   Your provider has ordered a exercise tolerance test. This test will evaluate the blood  supply to your heart muscle during periods of exercise and rest. For this test, you will raise your heart rate by walking on a treadmill at different levels.   you may eat a light breakfast/ lunch prior to your procedure no caffeine for 24 hours prior to your test (coffee, tea, soft drinks, or chocolate)  no smoking/ vaping for 4 hours prior to your test you may take your regular medications the day of your test except for:   - hold NONE bring any inhalers with you to your test wear comfortable clothing & tennis/ non-skid shoes to walk on the treadmill  This will take place at [address] [facility])  [place] [address]  Follow-Up: At [facility], you and your health needs are our priority.  As part of our continuing mission to provide you with exceptional heart care, our providers are all part of one team.  This team includes your primary Cardiologist (physician) and Advanced Practice Providers or APPs (Physician Assistants and Nurse Practitioners) who all work together to provide you with the care you need, when you need it.  Your next appointment:   1 month(s)  Provider:   Suzann Riddle, NP    We recommend signing up for the patient portal called "MyChart".  Sign up information is provided on this After Visit Summary.  MyChart is used to connect with patients for Virtual Visits (Telemedicine).  Patients are able to view lab/test results, encounter notes, upcoming appointments, etc.  Non-urgent messages can be  sent to your provider as well.   To learn more about what you can do with MyChart, go to [email or web address].

## 2024-01-12 ENCOUNTER — Ambulatory Visit (HOSPITAL_BASED_OUTPATIENT_CLINIC_OR_DEPARTMENT_OTHER)
Admission: RE | Admit: 2024-01-12 | Discharge: 2024-01-12 | Disposition: A | Discharge status: Home / Self Care | Source: Ambulatory Visit | Attending: Pulmonary Disease | Admitting: Pulmonary Disease

## 2024-01-12 DIAGNOSIS — I272 Pulmonary hypertension, unspecified: Secondary | ICD-10-CM | POA: Insufficient documentation

## 2024-01-13 LAB — ECHOCARDIOGRAM COMPLETE
AR max vel: 1.71 cm2
AV Area VTI: 1.8 cm2
AV Area mean vel: 1.67 cm2
AV Mean grad: 5 mmHg
AV Peak grad: 8.9 mmHg
Ao pk vel: 1.49 m/s
Area-P 1/2: 3.12 cm2
Calc EF: 63.3 %
MV M vel: 3.46 m/s
MV Peak grad: 47.9 mmHg
S' Lateral: 1.8 cm
Single Plane A2C EF: 60.8 %
Single Plane A4C EF: 66.4 %

## 2024-01-14 ENCOUNTER — Encounter: Payer: Self-pay | Admitting: Nurse Practitioner

## 2024-01-14 ENCOUNTER — Ambulatory Visit: Attending: Nurse Practitioner | Admitting: Nurse Practitioner

## 2024-01-14 VITALS — BP 126/78 | HR 62 | Ht 63.0 in | Wt 162.6 lb

## 2024-01-14 DIAGNOSIS — I480 Paroxysmal atrial fibrillation: Secondary | ICD-10-CM

## 2024-01-14 DIAGNOSIS — R531 Weakness: Secondary | ICD-10-CM

## 2024-01-14 DIAGNOSIS — I10 Essential (primary) hypertension: Secondary | ICD-10-CM

## 2024-01-14 DIAGNOSIS — I272 Pulmonary hypertension, unspecified: Secondary | ICD-10-CM

## 2024-01-14 NOTE — Patient Instructions (Signed)
 Medication Instructions:  Your physician recommends that you continue on your current medications as directed. Please refer to the Current Medication list given to you today.   *If you need a refill on your cardiac medications before your next appointment, please call your pharmacy*  Lab Work: Your provider would like for you to have following labs drawn today (CBC, BMP).    Testing/Procedures: Your physician has requested that you have a cardiac catheterization. Cardiac catheterization is used to diagnose and/or treat various heart conditions. Doctors may recommend this procedure for a number of different reasons. The most common reason is to evaluate chest pain. Chest pain can be a symptom of coronary artery disease (CAD), and cardiac catheterization can show whether plaque is narrowing or blocking your heart's arteries. This procedure is also used to evaluate the valves, as well as measure the blood flow and oxygen levels in different parts of your heart. For further information please visit [email or web address] Please follow instruction sheet, as given. Please see instructions below   Follow-Up: At [facility], you and your health needs are our priority.  As part of our continuing mission to provide you with exceptional heart care, our providers are all part of one team.  This team includes your primary Cardiologist (physician) and Advanced Practice Providers or APPs (Physician Assistants and Nurse Practitioners) who all work together to provide you with the care you need, when you need it.  Your next appointment:   February 10, 2024 @ 1:55 pm  Provider:   Laneta [name], Doris [name]     [facility] A DEPT OF [place] AT [facility] [address] Doris [name] [address] [place] [address] Dept: [phone] Loc: [phone]  Doris [name]  01/14/2024  You are scheduled for a Cardiac Catheterization on Monday, June 2 with Dr.  Antionette [name].  1. Please arrive at the [facility] Entrance of [facility], [address], [place] [address] at 8:30 AM (This is 1 hour(s) prior to your procedure time).  Proceed to the Check-In Desk directly inside the entrance.  Procedure Parking: Use the entrance off of the [facility] [address] side of the hospital. Turn right upon entering and follow the driveway to parking that is directly in front of the Heart & Vascular Center. There is no valet parking available at this entrance, however there is an awning directly in front of the Heart & Vascular Center for drop off/ pick up for patients.  Special note: Every effort is made to have your procedure done on time. Please understand that emergencies sometimes delay scheduled procedures.  2. Diet: Do not eat solid foods after midnight.  The patient may have clear liquids until 5am upon the day of the procedure.  3. Labs: You will need to have blood drawn today (CBC, BMP)  4. Medication instructions in preparation for your procedure:   Contrast Allergy: No  Hold Eliquis  2 days prior to procedure Hold Spironolactone  morning of the procedure    5. Plan to go home the same day, you will only stay overnight if medically necessary. 6. Bring a current list of your medications and current insurance cards. 7. You MUST have a responsible person to drive you home. 8. Someone MUST be with you the first 24 hours after you arrive home or your discharge will be delayed. 9. Please wear clothes that are easy to get on and off and wear slip-on shoes.  Thank you for allowing us   to care for you!   -- [facility]

## 2024-01-14 NOTE — Progress Notes (Signed)
 Cardiology Clinic Note   Patient Name: Doris [name] Date of Encounter: 01/14/2024  Primary Care Provider:  Trenton Frock, PA-C Primary Cardiologist:  Doris Kirks, MD  Patient Profile    78 y.o. female with a history of paroxysmal atrial fibrillation, PSVT, pulmonary hypertension, essential hypertension, hyperlipidemia, and hyperthyroidism, who presents for follow-up after recent echo showed worsening of pulmonary hypertension with recommendation from pulmonologist for right heart catheterization.  Past Medical History    Past Medical History:  Diagnosis Date   Colitis    Graves disease    Hyperlipidemia    Hypertension    Hyperthyroidism    PAF (paroxysmal atrial fibrillation) (HCC)    a. Dx 03/2019 -->AF RVR @ [facility]; b. 04/2019 Zio: <1% Afib burden; c. CHA2DS2VASc = 4-->Eliquis .   PAH (pulmonary artery hypertension) (HCC)    a. 05/2017 Echo: EF 60-65%, PASP ; b. 04/2019 Echo: EF 60-65% PASP ; c. 10/2021 Echo: EF 60-65%, RVSP 42.40mmHg; d. 11/2022 Echo: EF 55-60%, RVSP 44.7 mmHg; e. 12/2023 Echo: EF 60-65%, no rwma, nl RV fxn, RVSP 55.35mmHg. Triv MR.   Palpitations    a. 05/2017 Echo: EF 60-65%; b. 05/2017 48h Holter: Rare PACs and PVCs. 11 beat run of SVT w/ aberrancy; c. 04/2019 Zio: Avg HR 56. 37 episodes of SVT, longest 19. Intermittent AF (<1% burden, longest 1:39). Rare PVCs.   Positive TB test    Valvular heart disease    a. 10/2021 Echo: Mild MR, Mod TR.   Past Surgical History:  Procedure Laterality Date   CESAREAN SECTION     x 2   EXCISION METACARPAL MASS Right 12/17/2017   Procedure: EXCISION METACARPAL MASS;  Surgeon: Doris Angelucci, MD;  Location: [facility] ORS;  Service: Orthopedics;  Laterality: Right;    Allergies  No Known Allergies  History of Present Illness      78 y.o. y/o female with the above past medical history including paroxysmal atrial fibrillation, PSVT, pulmonary hypertension, primary hypertension, hyperlipidemia, and hyperthyroidism.   She was previously evaluated in late 2018 for palpitations with monitoring at that time showing rare PACs and PVCs and an 11 beat run of SVT with aberrancy. Echocardiogram in October 2018 showed normal LV function with grade 1 diastolic dysfunction, as well as pulmonary hypertension with a PASP of 38 mmHg. She was diagnosed with atrial fibrillation following presentation to [facility] in August 2020. Subsequent monitoring showed an A-fib burden of less than 1% with the longest episode lasting 1 minute and 39 seconds. 37 runs of SVT were also noted. She has been managed with beta-blocker and Eliquis  therapy. In the setting of pulmonary hypertension, she has been followed by pulmonology, and has been wearing oxygen at night.   In February 2025, patient was evaluated at the [facility] emergency department in the setting of A-fib with RVR without clear trigger.  ED workup was relatively unremarkable.  TSH was mildly elevated at 4.6.  Rate improved with IV diltiazem and she was subsequently transitioned to p.o. diltiazem.  She followed-up with electrophysiology in March 2025 and was placed on flecainide  50 mg twice daily.  She preferred to avoid an invasive procedure.  Follow-up ECG on April 9 showed stable intervals.      Ms. Doris [name] was just seen in cardiology clinic on May 15 at which time she reported episodes of weakness and fatigue often associated with GI symptoms described as a nervous stomach.  Symptoms preceded use of flecainide  but became more frequent following initiation.  We  agreed to place an event monitor.  She just had a repeat echo for follow-up of pulmonary hypertension which showed an EF of 60 to 65% with rise in RVSP to 55.7 mmHg after trending in the 40's dating back to 2020.  Her pulmonologist requested cardiology follow-up for right heart catheterization.  Since her last visit, she has generally felt well.  She had 1 weak spell while wearing the monitor, though monitoring thus far has not  shown any significant arrhythmias.  She denies chest pain, dyspnea, palpitations, PND, orthopnea, dizziness, syncope, edema, or early satiety. Objective   Home Medications    Current Outpatient Medications  Medication Sig Dispense Refill   acetaminophen  (TYLENOL ) 325 MG tablet Take 325 mg by mouth every 6 (six) hours as needed for headache.      albuterol  (VENTOLIN  HFA) 108 (90 Base) MCG/ACT inhaler Inhale 1 puff into the lungs every 6 (six) hours as needed for wheezing or shortness of breath. 8 g 2   benzonatate  (TESSALON ) 100 MG capsule Take 1 capsule (100 mg total) by mouth 2 (two) times daily as needed. 20 capsule 0   Carboxymethylcellul-Glycerin (LUBRICATING EYE DROPS OP) Place 2 drops into both eyes daily as needed (dry eyes).     Cholecalciferol (VITAMIN D3) 25 MCG (1000 UT) CAPS Take by mouth. Take 1 capsule (25 mcg total) by mouth daily.     dexamethasone (DECADRON) 0.5 MG/5ML solution as needed (sore gums).     diltiazem (CARDIZEM CD) 180 MG 24 hr capsule Take 180 mg by mouth every evening.     ELIQUIS  5 MG TABS tablet TAKE 1 TABLET BY MOUTH TWICE A DAY 180 tablet 1   flecainide  (TAMBOCOR ) 50 MG tablet Take 1 tablet (50 mg total) by mouth 2 (two) times daily. 60 tablet 3   losartan  (COZAAR ) 100 MG tablet Take 1 tablet (100 mg total) by mouth daily. 90 tablet 3   methimazole  (TAPAZOLE ) 5 MG tablet Take 1 tablet (5 mg total) by mouth every other day. 45 tablet 3   Multiple Vitamin (MULTIVITAMIN WITH MINERALS) TABS tablet Take 1 tablet by mouth daily.     rosuvastatin (CRESTOR) 5 MG tablet TAKE 1 TABLET BY MOUTH EVERY DAY 90 tablet 0   spironolactone  (ALDACTONE ) 25 MG tablet Take 1 tablet (25 mg total) by mouth daily. 90 tablet 0   No current facility-administered medications for this visit.     Family History    Family History  Problem Relation Age of Onset   Breast cancer Mother 55   Throat cancer Father    Thyroid  cancer Daughter    Colon cancer Neg Hx    She indicated  that her mother is deceased. She indicated that her father is deceased. She indicated that five of her seven sisters are alive. She indicated that her maternal grandmother is deceased. She indicated that her maternal grandfather is alive. She indicated that her paternal grandmother is deceased. She indicated that her paternal grandfather is deceased. She indicated that her daughter is alive. She indicated that the status of her neg hx is unknown.   Social History    Social History   Socioeconomic History   Marital status: Married    Spouse name: Not on file   Number of children: 5   Years of education: Not on file   Highest education level: 12th grade  Occupational History   Occupation: retired  Tobacco Use   Smoking status: Never   Smokeless tobacco: Never  Advertising account planner  Vaping status: Never Used  Substance and Sexual Activity   Alcohol use: No    Alcohol/week: 0.0 standard drinks of alcohol   Drug use: No   Sexual activity: Yes  Other Topics Concern   Not on file  Social History Narrative   Grew up in [place]. Lives with husband in a 2 story home.  Has 5 children. (1 set of twins)   Retired Architectural technologist.     Education: some college.    Social Drivers of Corporate investment banker Strain: Low Risk  (10/01/2023)   Overall Financial Resource Strain (CARDIA)    Difficulty of Paying Living Expenses: Not hard at all  Food Insecurity: No Food Insecurity (10/01/2023)   Hunger Vital Sign    Worried About Running Out of Food in the Last Year: Never true    Ran Out of Food in the Last Year: Never true  Transportation Needs: No Transportation Needs (10/01/2023)   PRAPARE - Administrator, Civil Service (Medical): No    Lack of Transportation (Non-Medical): No  Physical Activity: Insufficiently Active (10/01/2023)   Exercise Vital Sign    Days of Exercise per Week: 1 day    Minutes of Exercise per Session: 10 min  Stress: No Stress Concern Present (10/01/2023)    [employer] of Occupational Health - Occupational Stress Questionnaire    Feeling of Stress : Not at all  Social Connections: Moderately Integrated (10/01/2023)   Social Connection and Isolation Panel [NHANES]    Frequency of Communication with Friends and Family: More than three times a week    Frequency of Social Gatherings with Friends and Family: Once a week    Attends Religious Services: More than 4 times per year    Active Member of [employer] or Organizations: No    Attends Engineer, structural: Not on file    Marital Status: Married  Catering manager Violence: Not At Risk (06/24/2022)   [facility] from [facility], [facility]   Humiliation, Afraid, Rape, and Kick questionnaire    Fear of Current or Ex-Partner: No    Emotionally Abused: No    Physically Abused: No    Sexually Abused: No     Review of Systems    General:  No chills, fever, night sweats or weight changes.  Cardiovascular: 1 week spells since her last visit.  No chest pain, +++ h/o dyspnea on exertion no currently stable.  No edema, orthopnea, palpitations, paroxysmal nocturnal dyspnea. Dermatological: No rash, lesions/masses Respiratory: No cough, dyspnea Urologic: No hematuria, dysuria Abdominal:   No nausea, vomiting, diarrhea, bright red blood per rectum, melena, or hematemesis Neurologic:  No visual changes, wkns, changes in mental status. All other systems reviewed and are otherwise negative except as noted above.     Physical Exam    VS:  BP 126/78   Pulse 62   Ht 5\' 3"  (1.6 m)   Wt 162 lb 9.6 oz (73.8 kg)   SpO2 99%   BMI 28.80 kg/m  , BMI Body mass index is 28.8 kg/m.     GEN: Well nourished, well developed, in no acute distress. HEENT: normal. Neck: Supple, no JVD, carotid bruits, or masses. Cardiac: RRR, 2/6 systolic murmur at the upper sternal borders.  No rubs or gallops. No clubbing, cyanosis, edema.  Radials 2+/PT 2+ and equal bilaterally.  Respiratory:  Respirations  regular and unlabored, clear to auscultation bilaterally. GI: Soft, nontender, nondistended, BS + x 4. MS: no  deformity or atrophy. Skin: warm and dry, no rash. Neuro:  Strength and sensation are intact. Psych: Normal affect.  Accessory Clinical Findings     Lab Results  Component Value Date   WBC 9.9 01/24/2020   HGB 13.0 01/24/2020   HCT 37.5 01/24/2020   MCV 86.0 01/24/2020   PLT 251 01/24/2020   Lab Results  Component Value Date   CREATININE 1.00 02/20/2020   BUN 22 02/20/2020   NA 136 02/20/2020   K 4.6 02/20/2020   CL 101 02/20/2020   CO2 27 02/20/2020   Lab Results  Component Value Date   ALT 14 06/07/2015   AST 17 06/07/2015   ALKPHOS 65 06/07/2015   BILITOT 1.0 06/07/2015   Lab Results  Component Value Date   LDLDIRECT 74.0 06/07/2015    Lab Results  Component Value Date   HGBA1C 5.5 06/07/2015      Assessment & Plan   1.  Pulmonary hypertension: Recent echo with increase in RVSP to 55.7 mmHg.  Pulmonary pressures trending in the 40's dating back to 2020.  Concern for primary pulmonary hypertension with request to perform right heart catheterization by patient's pulmonologist.  Her husband is present with her today and we discussed risks and benefits of the procedure.  Patient is willing to proceed.  She will hold Eliquis  for 2 days prior to her right heart cath. Informed Consent   Shared Decision Making/Informed Consent The risks [stroke (1 in 1000), death (1 in 1000), kidney failure [usually temporary] (1 in 500), bleeding (1 in 200), allergic reaction [possibly serious] (1 in 200)], benefits (diagnostic support and management of coronary artery disease) and alternatives of a cardiac catheterization were discussed in detail with Ms. Zook and she is willing to proceed.       2.  Weakness/fatigue: Described at visit on May 15 with episodes of sudden weakness and fatigue associated with what she describes as a nervous stomach.  We placed a ZIO monitor at  her last visit and she has had just 1 episode of weakness since then.  Monitoring results up to this point without any significant arrhythmias.  3.  Paroxysmal atrial fibrillation: No recurrence noted on recent monitoring, which she will complete early next week.  She remains on flecainide  therapy.  She is scheduled for an exercise treadmill test.  She remains on Eliquis  (CHA2DS2-VASc 4) and diltiazem.  4.  Primary hypertension: Blood pressure stable on diltiazem, losartan , and spironolactone .  5.  Hyperlipidemia: On statin therapy.  6.  Disposition: Plan for right heart catheterization next week.  She has follow-up here in June.  Laneta Pintos, NP 01/14/2024, 5:41 PM

## 2024-01-14 NOTE — H&P (View-Only) (Signed)
 Cardiology Clinic Note   Patient Name: Doris [name] Date of Encounter: 01/14/2024  Primary Care Provider:  Trenton Frock, PA-C Primary Cardiologist:  Antionette Kirks, MD  Patient Profile    78 y.o. female with a history of paroxysmal atrial fibrillation, PSVT, pulmonary hypertension, essential hypertension, hyperlipidemia, and hyperthyroidism, who presents for follow-up after recent echo showed worsening of pulmonary hypertension with recommendation from pulmonologist for right heart catheterization.  Past Medical History    Past Medical History:  Diagnosis Date   Colitis    Graves disease    Hyperlipidemia    Hypertension    Hyperthyroidism    PAF (paroxysmal atrial fibrillation) (HCC)    a. Dx 03/2019 -->AF RVR @ [facility]; b. 04/2019 Zio: <1% Afib burden; c. CHA2DS2VASc = 4-->Eliquis .   PAH (pulmonary artery hypertension) (HCC)    a. 05/2017 Echo: EF 60-65%, PASP ; b. 04/2019 Echo: EF 60-65% PASP ; c. 10/2021 Echo: EF 60-65%, RVSP 42.40mmHg; d. 11/2022 Echo: EF 55-60%, RVSP 44.7 mmHg; e. 12/2023 Echo: EF 60-65%, no rwma, nl RV fxn, RVSP 55.35mmHg. Triv MR.   Palpitations    a. 05/2017 Echo: EF 60-65%; b. 05/2017 48h Holter: Rare PACs and PVCs. 11 beat run of SVT w/ aberrancy; c. 04/2019 Zio: Avg HR 56. 37 episodes of SVT, longest 19. Intermittent AF (<1% burden, longest 1:39). Rare PVCs.   Positive TB test    Valvular heart disease    a. 10/2021 Echo: Mild MR, Mod TR.   Past Surgical History:  Procedure Laterality Date   CESAREAN SECTION     x 2   EXCISION METACARPAL MASS Right 12/17/2017   Procedure: EXCISION METACARPAL MASS;  Surgeon: Molli Angelucci, MD;  Location: [facility] ORS;  Service: Orthopedics;  Laterality: Right;    Allergies  No Known Allergies  History of Present Illness      78 y.o. y/o female with the above past medical history including paroxysmal atrial fibrillation, PSVT, pulmonary hypertension, primary hypertension, hyperlipidemia, and hyperthyroidism.   She was previously evaluated in late 2018 for palpitations with monitoring at that time showing rare PACs and PVCs and an 11 beat run of SVT with aberrancy. Echocardiogram in October 2018 showed normal LV function with grade 1 diastolic dysfunction, as well as pulmonary hypertension with a PASP of 38 mmHg. She was diagnosed with atrial fibrillation following presentation to [facility] in August 2020. Subsequent monitoring showed an A-fib burden of less than 1% with the longest episode lasting 1 minute and 39 seconds. 37 runs of SVT were also noted. She has been managed with beta-blocker and Eliquis  therapy. In the setting of pulmonary hypertension, she has been followed by pulmonology, and has been wearing oxygen at night.   In February 2025, patient was evaluated at the [facility] emergency department in the setting of A-fib with RVR without clear trigger.  ED workup was relatively unremarkable.  TSH was mildly elevated at 4.6.  Rate improved with IV diltiazem and she was subsequently transitioned to p.o. diltiazem.  She followed-up with electrophysiology in March 2025 and was placed on flecainide  50 mg twice daily.  She preferred to avoid an invasive procedure.  Follow-up ECG on April 9 showed stable intervals.      Ms. Bath was just seen in cardiology clinic on May 15 at which time she reported episodes of weakness and fatigue often associated with GI symptoms described as a nervous stomach.  Symptoms preceded use of flecainide  but became more frequent following initiation.  We  agreed to place an event monitor.  She just had a repeat echo for follow-up of pulmonary hypertension which showed an EF of 60 to 65% with rise in RVSP to 55.7 mmHg after trending in the 40's dating back to 2020.  Her pulmonologist requested cardiology follow-up for right heart catheterization.  Since her last visit, she has generally felt well.  She had 1 weak spell while wearing the monitor, though monitoring thus far has not  shown any significant arrhythmias.  She denies chest pain, dyspnea, palpitations, PND, orthopnea, dizziness, syncope, edema, or early satiety. Objective   Home Medications    Current Outpatient Medications  Medication Sig Dispense Refill   acetaminophen  (TYLENOL ) 325 MG tablet Take 325 mg by mouth every 6 (six) hours as needed for headache.      albuterol  (VENTOLIN  HFA) 108 (90 Base) MCG/ACT inhaler Inhale 1 puff into the lungs every 6 (six) hours as needed for wheezing or shortness of breath. 8 g 2   benzonatate  (TESSALON ) 100 MG capsule Take 1 capsule (100 mg total) by mouth 2 (two) times daily as needed. 20 capsule 0   Carboxymethylcellul-Glycerin (LUBRICATING EYE DROPS OP) Place 2 drops into both eyes daily as needed (dry eyes).     Cholecalciferol (VITAMIN D3) 25 MCG (1000 UT) CAPS Take by mouth. Take 1 capsule (25 mcg total) by mouth daily.     dexamethasone (DECADRON) 0.5 MG/5ML solution as needed (sore gums).     diltiazem (CARDIZEM CD) 180 MG 24 hr capsule Take 180 mg by mouth every evening.     ELIQUIS  5 MG TABS tablet TAKE 1 TABLET BY MOUTH TWICE A DAY 180 tablet 1   flecainide  (TAMBOCOR ) 50 MG tablet Take 1 tablet (50 mg total) by mouth 2 (two) times daily. 60 tablet 3   losartan  (COZAAR ) 100 MG tablet Take 1 tablet (100 mg total) by mouth daily. 90 tablet 3   methimazole  (TAPAZOLE ) 5 MG tablet Take 1 tablet (5 mg total) by mouth every other day. 45 tablet 3   Multiple Vitamin (MULTIVITAMIN WITH MINERALS) TABS tablet Take 1 tablet by mouth daily.     rosuvastatin (CRESTOR) 5 MG tablet TAKE 1 TABLET BY MOUTH EVERY DAY 90 tablet 0   spironolactone  (ALDACTONE ) 25 MG tablet Take 1 tablet (25 mg total) by mouth daily. 90 tablet 0   No current facility-administered medications for this visit.     Family History    Family History  Problem Relation Age of Onset   Breast cancer Mother 55   Throat cancer Father    Thyroid  cancer Daughter    Colon cancer Neg Hx    She indicated  that her mother is deceased. She indicated that her father is deceased. She indicated that five of her seven sisters are alive. She indicated that her maternal grandmother is deceased. She indicated that her maternal grandfather is alive. She indicated that her paternal grandmother is deceased. She indicated that her paternal grandfather is deceased. She indicated that her daughter is alive. She indicated that the status of her neg hx is unknown.   Social History    Social History   Socioeconomic History   Marital status: Married    Spouse name: Not on file   Number of children: 5   Years of education: Not on file   Highest education level: 12th grade  Occupational History   Occupation: retired  Tobacco Use   Smoking status: Never   Smokeless tobacco: Never  Advertising account planner  Vaping status: Never Used  Substance and Sexual Activity   Alcohol use: No    Alcohol/week: 0.0 standard drinks of alcohol   Drug use: No   Sexual activity: Yes  Other Topics Concern   Not on file  Social History Narrative   Grew up in [place]. Lives with husband in a 2 story home.  Has 5 children. (1 set of twins)   Retired Architectural technologist.     Education: some college.    Social Drivers of Corporate investment banker Strain: Low Risk  (10/01/2023)   Overall Financial Resource Strain (CARDIA)    Difficulty of Paying Living Expenses: Not hard at all  Food Insecurity: No Food Insecurity (10/01/2023)   Hunger Vital Sign    Worried About Running Out of Food in the Last Year: Never true    Ran Out of Food in the Last Year: Never true  Transportation Needs: No Transportation Needs (10/01/2023)   PRAPARE - Administrator, Civil Service (Medical): No    Lack of Transportation (Non-Medical): No  Physical Activity: Insufficiently Active (10/01/2023)   Exercise Vital Sign    Days of Exercise per Week: 1 day    Minutes of Exercise per Session: 10 min  Stress: No Stress Concern Present (10/01/2023)    [employer] of Occupational Health - Occupational Stress Questionnaire    Feeling of Stress : Not at all  Social Connections: Moderately Integrated (10/01/2023)   Social Connection and Isolation Panel [NHANES]    Frequency of Communication with Friends and Family: More than three times a week    Frequency of Social Gatherings with Friends and Family: Once a week    Attends Religious Services: More than 4 times per year    Active Member of [employer] or Organizations: No    Attends Engineer, structural: Not on file    Marital Status: Married  Catering manager Violence: Not At Risk (06/24/2022)   [facility], [facility]   Humiliation, Afraid, Rape, and Kick questionnaire    Fear of Current or Ex-Partner: No    Emotionally Abused: No    Physically Abused: No    Sexually Abused: No     Review of Systems    General:  No chills, fever, night sweats or weight changes.  Cardiovascular: 1 week spells since her last visit.  No chest pain, +++ h/o dyspnea on exertion no currently stable.  No edema, orthopnea, palpitations, paroxysmal nocturnal dyspnea. Dermatological: No rash, lesions/masses Respiratory: No cough, dyspnea Urologic: No hematuria, dysuria Abdominal:   No nausea, vomiting, diarrhea, bright red blood per rectum, melena, or hematemesis Neurologic:  No visual changes, wkns, changes in mental status. All other systems reviewed and are otherwise negative except as noted above.     Physical Exam    VS:  BP 126/78   Pulse 62   Ht 5\' 3"  (1.6 m)   Wt 162 lb 9.6 oz (73.8 kg)   SpO2 99%   BMI 28.80 kg/m  , BMI Body mass index is 28.8 kg/m.     GEN: Well nourished, well developed, in no acute distress. HEENT: normal. Neck: Supple, no JVD, carotid bruits, or masses. Cardiac: RRR, 2/6 systolic murmur at the upper sternal borders.  No rubs or gallops. No clubbing, cyanosis, edema.  Radials 2+/PT 2+ and equal bilaterally.  Respiratory:  Respirations  regular and unlabored, clear to auscultation bilaterally. GI: Soft, nontender, nondistended, BS + x 4. MS: no  deformity or atrophy. Skin: warm and dry, no rash. Neuro:  Strength and sensation are intact. Psych: Normal affect.  Accessory Clinical Findings     Lab Results  Component Value Date   WBC 9.9 01/24/2020   HGB 13.0 01/24/2020   HCT 37.5 01/24/2020   MCV 86.0 01/24/2020   PLT 251 01/24/2020   Lab Results  Component Value Date   CREATININE 1.00 02/20/2020   BUN 22 02/20/2020   NA 136 02/20/2020   K 4.6 02/20/2020   CL 101 02/20/2020   CO2 27 02/20/2020   Lab Results  Component Value Date   ALT 14 06/07/2015   AST 17 06/07/2015   ALKPHOS 65 06/07/2015   BILITOT 1.0 06/07/2015   Lab Results  Component Value Date   LDLDIRECT 74.0 06/07/2015    Lab Results  Component Value Date   HGBA1C 5.5 06/07/2015      Assessment & Plan   1.  Pulmonary hypertension: Recent echo with increase in RVSP to 55.7 mmHg.  Pulmonary pressures trending in the 40's dating back to 2020.  Concern for primary pulmonary hypertension with request to perform right heart catheterization by patient's pulmonologist.  Her husband is present with her today and we discussed risks and benefits of the procedure.  Patient is willing to proceed.  She will hold Eliquis  for 2 days prior to her right heart cath. Informed Consent   Shared Decision Making/Informed Consent The risks [stroke (1 in 1000), death (1 in 1000), kidney failure [usually temporary] (1 in 500), bleeding (1 in 200), allergic reaction [possibly serious] (1 in 200)], benefits (diagnostic support and management of coronary artery disease) and alternatives of a cardiac catheterization were discussed in detail with Ms. Zook and she is willing to proceed.       2.  Weakness/fatigue: Described at visit on May 15 with episodes of sudden weakness and fatigue associated with what she describes as a nervous stomach.  We placed a ZIO monitor at  her last visit and she has had just 1 episode of weakness since then.  Monitoring results up to this point without any significant arrhythmias.  3.  Paroxysmal atrial fibrillation: No recurrence noted on recent monitoring, which she will complete early next week.  She remains on flecainide  therapy.  She is scheduled for an exercise treadmill test.  She remains on Eliquis  (CHA2DS2-VASc 4) and diltiazem.  4.  Primary hypertension: Blood pressure stable on diltiazem, losartan , and spironolactone .  5.  Hyperlipidemia: On statin therapy.  6.  Disposition: Plan for right heart catheterization next week.  She has follow-up here in June.  Laneta Pintos, NP 01/14/2024, 5:41 PM

## 2024-01-15 ENCOUNTER — Ambulatory Visit: Payer: Self-pay | Admitting: Nurse Practitioner

## 2024-01-15 LAB — CBC
Hematocrit: 35.2 % (ref 34.0–46.6)
Hemoglobin: 11.5 g/dL (ref 11.1–15.9)
MCH: 31.4 pg (ref 26.6–33.0)
MCHC: 32.7 g/dL (ref 31.5–35.7)
MCV: 96 fL (ref 79–97)
Platelets: 240 10*3/uL (ref 150–450)
RBC: 3.66 x10E6/uL — ABNORMAL LOW (ref 3.77–5.28)
RDW: 13 % (ref 11.7–15.4)
WBC: 9.2 10*3/uL (ref 3.4–10.8)

## 2024-01-15 LAB — BASIC METABOLIC PANEL WITH GFR
BUN/Creatinine Ratio: 18 (ref 12–28)
BUN: 25 mg/dL (ref 8–27)
CO2: 22 mmol/L (ref 20–29)
Calcium: 9.4 mg/dL (ref 8.7–10.3)
Chloride: 97 mmol/L (ref 96–106)
Creatinine, Ser: 1.36 mg/dL — ABNORMAL HIGH (ref 0.57–1.00)
Glucose: 84 mg/dL (ref 70–99)
Potassium: 5.1 mmol/L (ref 3.5–5.2)
Sodium: 132 mmol/L — ABNORMAL LOW (ref 134–144)
eGFR: 40 mL/min/{1.73_m2} — ABNORMAL LOW (ref >59)

## 2024-01-19 ENCOUNTER — Ambulatory Visit
Admission: RE | Admit: 2024-01-19 | Discharge: 2024-01-19 | Disposition: A | Discharge status: Home / Self Care | Source: Ambulatory Visit | Attending: Nurse Practitioner | Admitting: Nurse Practitioner

## 2024-01-19 DIAGNOSIS — I480 Paroxysmal atrial fibrillation: Secondary | ICD-10-CM | POA: Insufficient documentation

## 2024-01-19 LAB — EXERCISE TOLERANCE TEST
Angina Index: 0
Duke Treadmill Score: 6
Estimated workload: 5.3
Exercise duration (min): 6 min
Exercise duration (sec): 11 s
MPHR: 142 {beats}/min
Peak HR: 111 {beats}/min
Percent HR: 78 %
Rest HR: 74 {beats}/min
ST Depression (mm): 0 mm

## 2024-01-20 ENCOUNTER — Ambulatory Visit: Payer: Self-pay | Admitting: Nurse Practitioner

## 2024-01-21 ENCOUNTER — Telehealth: Payer: Self-pay | Admitting: *Deleted

## 2024-01-21 NOTE — Telephone Encounter (Signed)
 Scheduled for: Right Heart Cath Date: 6/2 Time: 09:30 am Arrival Time: 08:30 am Location: [facility] Instructions: Reviewed  Patient verbalized understanding of our conversation. She confirmed date, time, and location for her procedure.

## 2024-01-25 ENCOUNTER — Ambulatory Visit
Admission: RE | Admit: 2024-01-25 | Discharge: 2024-01-25 | Disposition: A | Discharge status: Home / Self Care | Attending: Cardiovascular Disease | Admitting: Cardiovascular Disease

## 2024-01-25 ENCOUNTER — Encounter: Payer: Self-pay | Admitting: Cardiovascular Disease

## 2024-01-25 ENCOUNTER — Encounter
Admission: RE | Disposition: A | Discharge status: Home / Self Care | Payer: Self-pay | Source: Home / Self Care | Attending: Cardiovascular Disease

## 2024-01-25 ENCOUNTER — Other Ambulatory Visit: Payer: Self-pay

## 2024-01-25 DIAGNOSIS — Z79899 Other long term (current) drug therapy: Secondary | ICD-10-CM | POA: Diagnosis not present

## 2024-01-25 DIAGNOSIS — E785 Hyperlipidemia, unspecified: Secondary | ICD-10-CM | POA: Diagnosis not present

## 2024-01-25 DIAGNOSIS — I10 Essential (primary) hypertension: Secondary | ICD-10-CM | POA: Diagnosis not present

## 2024-01-25 DIAGNOSIS — Z9981 Dependence on supplemental oxygen: Secondary | ICD-10-CM | POA: Diagnosis not present

## 2024-01-25 DIAGNOSIS — I272 Pulmonary hypertension, unspecified: Secondary | ICD-10-CM

## 2024-01-25 DIAGNOSIS — Z7901 Long term (current) use of anticoagulants: Secondary | ICD-10-CM | POA: Diagnosis not present

## 2024-01-25 DIAGNOSIS — I370 Nonrheumatic pulmonary valve stenosis: Secondary | ICD-10-CM

## 2024-01-25 DIAGNOSIS — I480 Paroxysmal atrial fibrillation: Secondary | ICD-10-CM | POA: Diagnosis not present

## 2024-01-25 HISTORY — PX: RIGHT HEART CATH: CATH118263

## 2024-01-25 LAB — POCT I-STAT EG7
Acid-base deficit: 1 mmol/L (ref 0.0–2.0)
Bicarbonate: 24.5 mmol/L (ref 20.0–28.0)
Calcium, Ion: 1.25 mmol/L (ref 1.15–1.40)
HCT: 34 % — ABNORMAL LOW (ref 36.0–46.0)
Hemoglobin: 11.6 g/dL — ABNORMAL LOW (ref 12.0–15.0)
O2 Saturation: 76 %
Potassium: 4.1 mmol/L (ref 3.5–5.1)
Sodium: 134 mmol/L — ABNORMAL LOW (ref 135–145)
TCO2: 26 mmol/L (ref 22–32)
pCO2, Ven: 41.1 mmHg — ABNORMAL LOW (ref 44–60)
pH, Ven: 7.383 (ref 7.25–7.43)
pO2, Ven: 42 mmHg (ref 32–45)

## 2024-01-25 SURGERY — RIGHT HEART CATH
Anesthesia: Moderate Sedation | Laterality: Right

## 2024-01-25 MED ORDER — HEPARIN (PORCINE) IN NACL 1000-0.9 UT/500ML-% IV SOLN
INTRAVENOUS | Status: AC
  Filled 2024-01-25: qty 1000

## 2024-01-25 MED ORDER — SODIUM CHLORIDE 0.9% FLUSH: 3.0000 mL | Freq: Two times a day (BID) | INTRAVENOUS | Status: DC

## 2024-01-25 MED ORDER — FENTANYL CITRATE (PF) 100 MCG/2ML IJ SOLN
INTRAMUSCULAR | Status: AC
  Filled 2024-01-25: qty 2

## 2024-01-25 MED ORDER — HEPARIN SODIUM (PORCINE) 1000 UNIT/ML IJ SOLN
INTRAMUSCULAR | Status: AC
  Filled 2024-01-25: qty 10

## 2024-01-25 MED ORDER — MIDAZOLAM HCL 2 MG/2ML IJ SOLN
INTRAMUSCULAR | Status: AC
  Filled 2024-01-25: qty 2

## 2024-01-25 MED ORDER — LIDOCAINE HCL (PF) 1 % IJ SOLN
INTRAMUSCULAR | Status: DC | PRN
  Administered 2024-01-25: 2 mL

## 2024-01-25 MED ORDER — ACETAMINOPHEN 325 MG PO TABS: 650.0000 mg | ORAL_TABLET | ORAL | Status: DC | PRN

## 2024-01-25 MED ORDER — SODIUM CHLORIDE 0.9 % IV SOLN: 250.0000 mL | INTRAVENOUS | Status: DC | PRN

## 2024-01-25 MED ORDER — VERAPAMIL HCL 2.5 MG/ML IV SOLN
INTRAVENOUS | Status: AC
  Filled 2024-01-25: qty 2

## 2024-01-25 MED ORDER — MIDAZOLAM HCL 2 MG/2ML IJ SOLN
INTRAMUSCULAR | Status: DC | PRN
  Administered 2024-01-25: 1 mg via INTRAVENOUS

## 2024-01-25 MED ORDER — LIDOCAINE HCL 1 % IJ SOLN
INTRAMUSCULAR | Status: AC
  Filled 2024-01-25: qty 20

## 2024-01-25 MED ORDER — FENTANYL CITRATE (PF) 100 MCG/2ML IJ SOLN
INTRAMUSCULAR | Status: DC | PRN
  Administered 2024-01-25: 25 ug via INTRAVENOUS

## 2024-01-25 MED ORDER — SODIUM CHLORIDE 0.9% FLUSH: 3.0000 mL | INTRAVENOUS | Status: DC | PRN

## 2024-01-25 MED ORDER — ONDANSETRON HCL 4 MG/2ML IJ SOLN: 4.0000 mg | Freq: Four times a day (QID) | INTRAMUSCULAR | Status: DC | PRN

## 2024-01-25 MED ORDER — SODIUM CHLORIDE 0.9 % IV SOLN: INTRAVENOUS | Status: DC

## 2024-01-25 MED ORDER — HEPARIN (PORCINE) IN NACL 1000-0.9 UT/500ML-% IV SOLN
INTRAVENOUS | Status: DC | PRN
  Administered 2024-01-25: 1000 mL

## 2024-01-25 SURGICAL SUPPLY — 6 items
CATH BALLN WEDGE 5F 110CM (CATHETERS) IMPLANT
DRAPE BRACHIAL (DRAPES) IMPLANT
PACK CARDIAC CATH (CUSTOM PROCEDURE TRAY) ×1 IMPLANT
SET ATX-X65L (MISCELLANEOUS) IMPLANT
SHEATH GLIDE SLENDER 4/5FR (SHEATH) IMPLANT
STATION PROTECTION PRESSURIZED (MISCELLANEOUS) IMPLANT

## 2024-01-25 NOTE — Progress Notes (Signed)
 Dr. Alvenia Aus in at bedside, speaking with pt. And her spouse re: RHC results. Both pt. And her spouse verbalized understanding of conversation with MD.

## 2024-01-25 NOTE — Interval H&P Note (Signed)
 History and Physical Interval Note:  01/25/2024 9:46 AM  Doris [name]  has presented today for surgery, with the diagnosis of R Cath   Pulmonary hypertension.  The various methods of treatment have been discussed with the patient and family. After consideration of risks, benefits and other options for treatment, the patient has consented to  Procedure(s): RIGHT HEART CATH (Right) as a surgical intervention.  The patient's history has been reviewed, patient examined, no change in status, stable for surgery.  I have reviewed the patient's chart and labs.  Questions were answered to the patient's satisfaction.     Rangel Echeverri

## 2024-01-26 ENCOUNTER — Encounter: Payer: Self-pay | Admitting: Cardiovascular Disease

## 2024-01-26 ENCOUNTER — Ambulatory Visit (INDEPENDENT_AMBULATORY_CARE_PROVIDER_SITE_OTHER)

## 2024-01-26 VITALS — Ht 63.0 in | Wt 162.0 lb

## 2024-01-26 DIAGNOSIS — Z0000 Encounter for general adult medical examination without abnormal findings: Secondary | ICD-10-CM | POA: Diagnosis not present

## 2024-01-26 DIAGNOSIS — Z1231 Encounter for screening mammogram for malignant neoplasm of breast: Secondary | ICD-10-CM | POA: Diagnosis not present

## 2024-01-26 LAB — POCT I-STAT EG7
Acid-base deficit: 1 mmol/L (ref 0.0–2.0)
Bicarbonate: 24.2 mmol/L (ref 20.0–28.0)
Calcium, Ion: 1.26 mmol/L (ref 1.15–1.40)
HCT: 35 % — ABNORMAL LOW (ref 36.0–46.0)
Hemoglobin: 11.9 g/dL — ABNORMAL LOW (ref 12.0–15.0)
O2 Saturation: 76 %
Potassium: 4.1 mmol/L (ref 3.5–5.1)
Sodium: 134 mmol/L — ABNORMAL LOW (ref 135–145)
TCO2: 25 mmol/L (ref 22–32)
pCO2, Ven: 40.3 mmHg — ABNORMAL LOW (ref 44–60)
pH, Ven: 7.387 (ref 7.25–7.43)
pO2, Ven: 41 mmHg (ref 32–45)

## 2024-01-26 NOTE — Patient Instructions (Signed)
 Ms. Doris [name] , Thank you for taking time out of your busy schedule to complete your Annual Wellness Visit with me. I enjoyed our conversation and look forward to speaking with you again next year. I, as well as your care team,  appreciate your ongoing commitment to your health goals. Please review the following plan we discussed and let me know if I can assist you in the future. Your Game plan/ To Do List    Referrals: You have an order for:  []   2D Mammogram  [x]   3D Mammogram  []   Bone Density     Please call for appointment:   [facility]  [address] [place] [address] [phone]   Make sure to wear two-piece clothing.  No lotions, powders, or deodorants the day of the appointment. Make sure to bring picture ID and insurance card.  Bring list of medications you are currently taking including any supplements.   Follow up Visits: Next Medicare AWV with our clinical staff: In 1 year    Have you seen your provider in the last 6 months (3 months if uncontrolled diabetes)? Yes Next Office Visit with your provider: To be scheduled   Clinician Recommendations:  Aim for 30 minutes of exercise or brisk walking, 6-8 glasses of water, and 5 servings of fruits and vegetables each day.       This is a list of the screening recommended for you and due dates:  Health Maintenance  Topic Date Due   Hepatitis C Screening  Never done   Zoster (Shingles) Vaccine (1 of 2) Never done   COVID-19 Vaccine (6 - 2024-25 season) 04/26/2023   Mammogram  02/11/2024   Flu Shot  03/25/2024   Medicare Annual Wellness Visit  01/25/2025   DTaP/Tdap/Td vaccine (4 - Td or Tdap) 02/17/2033   Pneumonia Vaccine  Completed   DEXA scan (bone density measurement)  Completed   HPV Vaccine  Aged Out   Meningitis B Vaccine  Aged Out    Advanced directives: (ACP Link)Information on Advanced Care Planning can be found at [place]  Secretary of [facility]  Advance Health Care Directives Advance Health Care Directives. [email or web address]   Advance Care Planning is important because it:  [x]  Makes sure you receive the medical care that is consistent with your values, goals, and preferences  [x]  It provides guidance to your family and loved ones and reduces their decisional burden about whether or not they are making the right decisions based on your wishes.  Follow the link provided in your after visit summary or read over the paperwork we have mailed to you to help you started getting your Advance Directives in place. If you need assistance in completing these, please reach out to us  so that we can help you!  See attachments for Preventive Care and Fall Prevention Tips.

## 2024-01-26 NOTE — Progress Notes (Signed)
 Subjective:   Doris [name] is a 78 y.o. who presents for a Medicare Wellness preventive visit.  As a reminder, Annual Wellness Visits don't include a physical exam, and some assessments may be limited, especially if this visit is performed virtually. We may recommend an in-person follow-up visit with your provider if needed.  Visit Complete: Virtual I connected with  Katalaya T Walkowiak on 01/26/24 by a audio enabled telemedicine application and verified that I am speaking with the correct person using two identifiers.  Patient Location: Home  Provider Location: Home Office  I discussed the limitations of evaluation and management by telemedicine. The patient expressed understanding and agreed to proceed.  Vital Signs: Because this visit was a virtual/telehealth visit, some criteria may be missing or patient reported. Any vitals not documented were not able to be obtained and vitals that have been documented are patient reported.  VideoDeclined- This patient declined Librarian, academic. Therefore the visit was completed with audio only.  Persons Participating in Visit: Patient.  AWV Questionnaire: No: Patient Medicare AWV questionnaire was not completed prior to this visit.  Cardiac Risk Factors include: advanced age (>23men, >44 women);hypertension     Objective:     Today's Vitals   01/26/24 1351  Weight: 162 lb (73.5 kg)  Height: 5\' 3"  (1.6 m)   Body mass index is 28.7 kg/m.     01/26/2024    2:06 PM 01/25/2024    9:02 AM 01/24/2020   10:49 PM 12/17/2017    8:50 AM 12/08/2017   10:16 AM 08/02/2015    1:24 PM 11/16/2014    1:26 PM  Advanced Directives  Does Patient Have a Medical Advance Directive? No No No No No No No  Would patient like information on creating a medical advance directive? Yes (MAU/Ambulatory/Procedural Areas - Information given) No - Patient declined No - Patient declined No - Patient declined No - Patient declined No - patient  declined information No - patient declined information;Yes - Educational materials given    Current Medications (verified) Outpatient Encounter Medications as of 01/26/2024  Medication Sig   acetaminophen  (TYLENOL ) 325 MG tablet Take 325 mg by mouth every 6 (six) hours as needed for headache.    Carboxymethylcellul-Glycerin (LUBRICATING EYE DROPS OP) Place 2 drops into both eyes daily as needed (dry eyes).   cetirizine (ZYRTEC) 10 MG tablet Take 10 mg by mouth daily as needed for allergies.   Cholecalciferol (VITAMIN D3) 25 MCG (1000 UT) CAPS Take 1,000 Units by mouth daily.   diltiazem (CARDIZEM CD) 180 MG 24 hr capsule Take 180 mg by mouth every evening.   ELIQUIS  5 MG TABS tablet TAKE 1 TABLET BY MOUTH TWICE A DAY   flecainide  (TAMBOCOR ) 50 MG tablet Take 1 tablet (50 mg total) by mouth 2 (two) times daily.   losartan  (COZAAR ) 100 MG tablet Take 1 tablet (100 mg total) by mouth daily.   methimazole  (TAPAZOLE ) 5 MG tablet Take 1 tablet (5 mg total) by mouth every other day.   Multiple Vitamin (MULTIVITAMIN WITH MINERALS) TABS tablet Take 1 tablet by mouth daily.   rosuvastatin (CRESTOR) 5 MG tablet TAKE 1 TABLET BY MOUTH EVERY DAY   spironolactone  (ALDACTONE ) 25 MG tablet Take 1 tablet (25 mg total) by mouth daily.   dexamethasone (DECADRON) 0.5 MG/5ML solution Take 0.5 mg by mouth daily as needed (sore gums). (Patient not taking: Reported on 01/26/2024)   No facility-administered encounter medications on file as of 01/26/2024.  Allergies (verified) Patient has no known allergies.   History: Past Medical History:  Diagnosis Date   Colitis    Graves disease    Hyperlipidemia    Hypertension    Hyperthyroidism    PAF (paroxysmal atrial fibrillation) (HCC)    a. Dx 03/2019 -->AF RVR @ [facility]; b. 04/2019 [facility]io: <1% Afib burden; c. CHA2DS2VASc = 4-->Eliquis .   PAH (pulmonary artery hypertension) (HCC)    a. 05/2017 Echo: EF 60-65%, PASP ; b. 04/2019 Echo: EF 60-65% PASP ; c. 10/2021  Echo: EF 60-65%, RVSP 42.26mmHg; d. 11/2022 Echo: EF 55-60%, RVSP 44.7 mmHg; e. 12/2023 Echo: EF 60-65%, no rwma, nl RV fxn, RVSP 55.10mmHg. Triv MR.   Palpitations    a. 05/2017 Echo: EF 60-65%; b. 05/2017 48h Holter: Rare PACs and PVCs. 11 beat run of SVT w/ aberrancy; c. 04/2019 [facility]io: Avg HR 56. 37 episodes of SVT, longest 19. Intermittent AF (<1% burden, longest 1:39). Rare PVCs.   Positive TB test    Valvular heart disease    a. 10/2021 Echo: Mild MR, Mod TR.   Past Surgical History:  Procedure Laterality Date   CESAREAN SECTION     x 2   EXCISION METACARPAL MASS Right 12/17/2017   Procedure: EXCISION METACARPAL MASS;  Surgeon: Molli Angelucci, MD;  Location: [facility] ORS;  Service: Orthopedics;  Laterality: Right;   RIGHT HEART CATH Right 01/25/2024   Procedure: RIGHT HEART CATH;  Surgeon: Wenona Hamilton, MD;  Location: [facility] INVASIVE CV LAB;  Service: Cardiovascular;  Laterality: Right;   Family History  Problem Relation Age of Onset   Breast cancer Mother 62   Throat cancer Father    Thyroid  cancer Daughter    Colon cancer Neg Hx    Social History   Socioeconomic History   Marital status: Married    Spouse name: Not on file   Number of children: 5   Years of education: Not on file   Highest education level: 12th grade  Occupational History   Occupation: retired  Tobacco Use   Smoking status: Never   Smokeless tobacco: Never  Vaping Use   Vaping status: Never Used  Substance and Sexual Activity   Alcohol use: No    Alcohol/week: 0.0 standard drinks of alcohol   Drug use: No   Sexual activity: Yes  Other Topics Concern   Not on file  Social History Narrative   Grew up in [place]. Lives with husband in a 2 story home.  Has 5 children. (1 set of twins)   Retired Architectural technologist.     Education: some college.    Social Drivers of Corporate investment banker Strain: Low Risk  (01/26/2024)   Overall Financial Resource Strain (CARDIA)    Difficulty of Paying Living  Expenses: Not hard at all  Food Insecurity: No Food Insecurity (01/26/2024)   Hunger Vital Sign    Worried About Running Out of Food in the Last Year: Never true    Ran Out of Food in the Last Year: Never true  Transportation Needs: No Transportation Needs (01/26/2024)   PRAPARE - Administrator, Civil Service (Medical): No    Lack of Transportation (Non-Medical): No  Physical Activity: Insufficiently Active (01/26/2024)   Exercise Vital Sign    Days of Exercise per Week: 3 days    Minutes of Exercise per Session: 30 min  Stress: No Stress Concern Present (01/26/2024)   [employer] of Occupational Health - Occupational Stress Questionnaire  Feeling of Stress : Not at all  Social Connections: Moderately Integrated (01/26/2024)   Social Connection and Isolation Panel [NHANES]    Frequency of Communication with Friends and Family: More than three times a week    Frequency of Social Gatherings with Friends and Family: Once a week    Attends Religious Services: More than 4 times per year    Active Member of [employer] or Organizations: No    Attends Banker Meetings: Never    Marital Status: Married    Tobacco Counseling Counseling given: Not Answered    Clinical Intake:  Pre-visit preparation completed: Yes  Pain : No/denies pain     Diabetes: No  Lab Results  Component Value Date   HGBA1C 5.5 06/07/2015     How often do you need to have someone help you when you read instructions, pamphlets, or other written materials from your doctor or pharmacy?: 1 - Never  Interpreter Needed?: No  Information entered by :: Seabron Cypress LPN   Activities of Daily Living     01/26/2024    2:06 PM 01/25/2024    9:01 AM  In your present state of health, do you have any difficulty performing the following activities:  Hearing? 0 0  Vision? 0 0  Difficulty concentrating or making decisions? 0 0  Walking or climbing stairs? 0   Dressing or bathing? 0   Doing  errands, shopping? 0   Preparing Food and eating ? N   Using the Toilet? N   In the past six months, have you accidently leaked urine? N   Do you have problems with loss of bowel control? N   Managing your Medications? N   Managing your Finances? N   Housekeeping or managing your Housekeeping? N     Patient Care Team: Trenton Frock, PA-C as PCP - General (Physician Assistant) Wenona Hamilton, MD as PCP - Cardiology (Cardiology) Wenona Hamilton, MD as Consulting Physician (Cardiology) Marc Senior, MD as Consulting Physician (Pulmonary Disease) Pa, [facility] (Optometry)  I have updated your Care Teams any recent Medical Services you may have received from other providers in the past year.     Assessment:    This is a routine wellness examination for Cherissa.  Hearing/Vision screen Hearing Screening - Comments:: Denies hearing difficulties   Vision Screening - Comments:: Wears rx glasses - up to date with routine eye exams with [facility]    Goals Addressed             This Visit's Progress    Increase physical activity   On track    Start yoga routine at home daily as tolerated.       Depression Screen     01/26/2024    2:00 PM 10/20/2023    9:56 AM 09/03/2016   11:18 AM 08/02/2015    1:31 PM  PHQ 2/9 Scores  PHQ - 2 Score 0 0 0 0  PHQ- 9 Score  0      Fall Risk     01/26/2024    2:05 PM 10/20/2023    9:56 AM 09/03/2016   11:18 AM 08/02/2015    1:31 PM  Fall Risk   Falls in the past year? 0 0 No No  Number falls in past yr: 0 0    Injury with Fall? 0 0    Risk for fall due to : No Fall Risks     Follow up Falls prevention discussed;Education  provided;Falls evaluation completed Falls evaluation completed      MEDICARE RISK AT HOME:  Medicare Risk at Home Any stairs in or around the home?: No If so, are there any without handrails?: No Home free of loose throw rugs in walkways, pet beds, electrical cords, etc?: Yes Adequate lighting in  your home to reduce risk of falls?: Yes Life alert?: No Use of a cane, walker or w/c?: No Grab bars in the bathroom?: Yes Shower chair or bench in shower?: No Elevated toilet seat or a handicapped toilet?: Yes  TIMED UP AND GO:  Was the test performed?  No  Cognitive Function: 6CIT completed    08/02/2015    1:35 PM  MMSE - Mini Mental State Exam  Orientation to time 5  Orientation to Place 5  Registration 3  Attention/ Calculation 5  Recall 3  Language- name 2 objects 2  Language- repeat 1  Language- follow 3 step command 3  Language- read & follow direction 1  Write a sentence 1  Copy design 1  Total score 30        01/26/2024    2:06 PM  6CIT Screen  What Year? 0 points  What month? 0 points  What time? 0 points  Count back from 20 0 points  Months in reverse 0 points  Repeat phrase 0 points  Total Score 0 points    Immunizations Immunization History  Administered Date(s) Administered   Fluad Quad(high Dose 65+) 05/16/2020, 05/19/2022   Fluad Trivalent(High Dose 65+) 05/22/2023   Influenza Inj Mdck Quad Pf 05/16/2020   Influenza, Seasonal, Injecte, Preservative Fre 08/06/2011, 06/24/2012   Influenza,inj,Quad PF,6+ Mos 06/28/2014, 06/07/2015, 06/10/2016, 05/21/2017, 06/07/2018, 05/05/2019, 06/14/2021   Moderna Sars-Cov-2 Peds vaccine 3yrs thru 93yrs 01/07/2021   Moderna Sars-Covid-2 Vaccination 09/24/2019, 10/22/2019, 06/18/2020, 01/07/2021   Pneumococcal Conjugate-13 05/15/2015   Pneumococcal Polysaccharide-23 01/26/2014   Pneumococcal-Unspecified 05/15/2015   Rsv, Bivalent, Protein Subunit Rsvpref,pf Pattricia Bores) 07/18/2023   Td 10/02/2010   Tdap 10/02/2010, 02/18/2023    Screening Tests Health Maintenance  Topic Date Due   Hepatitis C Screening  Never done   Zoster Vaccines- Shingrix (1 of 2) Never done   COVID-19 Vaccine (6 - 2024-25 season) 04/26/2023   MAMMOGRAM  02/11/2024   INFLUENZA VACCINE  03/25/2024   Medicare Annual Wellness (AWV)   01/25/2025   DTaP/Tdap/Td (4 - Td or Tdap) 02/17/2033   Pneumonia Vaccine 24+ Years old  Completed   DEXA SCAN  Completed   HPV VACCINES  Aged Out   Meningococcal B Vaccine  Aged Out    Health Maintenance  Health Maintenance Due  Topic Date Due   Hepatitis C Screening  Never done   Zoster Vaccines- Shingrix (1 of 2) Never done   COVID-19 Vaccine (6 - 2024-25 season) 04/26/2023   Health Maintenance Items Addressed: Mammogram ordered; declined dexa; information provided on shingrix   Additional Screening:  Vision Screening: Recommended annual ophthalmology exams for early detection of glaucoma and other disorders of the eye. Would you like a referral to an eye doctor? No    Dental Screening: Recommended annual dental exams for proper oral hygiene  Community Resource Referral / Chronic Care Management: CRR required this visit?  No   CCM required this visit?  No   Plan:    I have personally reviewed and noted the following in the patient's chart:   Medical and social history Use of alcohol, tobacco or illicit drugs  Current medications and supplements including opioid prescriptions.  Patient is not currently taking opioid prescriptions. Functional ability and status Nutritional status Physical activity Advanced directives List of other physicians Hospitalizations, surgeries, and ER visits in previous 12 months Vitals Screenings to include cognitive, depression, and falls Referrals and appointments  In addition, I have reviewed and discussed with patient certain preventive protocols, quality metrics, and best practice recommendations. A written personalized care plan for preventive services as well as general preventive health recommendations were provided to patient.   Seabron Cypress [place], [place]   09/25/3084   After Visit Summary: (MyChart) Due to this being a telephonic visit, the after visit summary with patients personalized plan was offered to patient via  MyChart   Notes: Nothing significant to report at this time.

## 2024-01-28 ENCOUNTER — Ambulatory Visit: Payer: Self-pay | Admitting: Pulmonary Disease

## 2024-02-02 DIAGNOSIS — R55 Syncope and collapse: Secondary | ICD-10-CM

## 2024-02-03 ENCOUNTER — Encounter: Payer: Self-pay | Admitting: Physician Assistant

## 2024-02-03 ENCOUNTER — Ambulatory Visit: Admitting: Physician Assistant

## 2024-02-03 VITALS — BP 130/66 | HR 60 | Ht 63.0 in | Wt 160.2 lb

## 2024-02-03 DIAGNOSIS — E0500 Thyrotoxicosis with diffuse goiter without thyrotoxic crisis or storm: Secondary | ICD-10-CM

## 2024-02-03 DIAGNOSIS — E162 Hypoglycemia, unspecified: Secondary | ICD-10-CM | POA: Diagnosis not present

## 2024-02-03 DIAGNOSIS — E871 Hypo-osmolality and hyponatremia: Secondary | ICD-10-CM | POA: Diagnosis not present

## 2024-02-03 NOTE — Progress Notes (Signed)
 Established patient visit   Patient: Doris [name]   DOB: 07-Sep-1945   78 y.o. Female  MRN: [id number] Visit Date: 02/03/2024  Today's healthcare provider: Trenton Frock, PA-C   Chief Complaint  Patient presents with   Acute Visit    Patient presents today for tremors in hands. She thinks that it may have came from the decadron medication.   Subjective     Discussed the use of AI scribe software for clinical note transcription with the patient, who gave verbal consent to proceed.  History of Present Illness   Doris [name] is a 78 year old female who presents with episodes of weakness and tremors.  She experiences weakness and tremors in her hands, often occurring when she feels 'washed out' and is hungry, typically around 11:30 AM after breakfast. These symptoms improve after eating, such as when she had a hamburger, which alleviated her symptoms for the rest of the day. There is no associated nausea, pain, or changes in bowel movements during these episodes.  She has experienced low sodium levels intermittently, cardiology had mentioned to her taking sodium tablets. She has started drinking coconut water for hydration and is mindful of her fluid intake.        Medications: Outpatient Medications Prior to Visit  Medication Sig   acetaminophen  (TYLENOL ) 325 MG tablet Take 325 mg by mouth every 6 (six) hours as needed for headache.    Carboxymethylcellul-Glycerin (LUBRICATING EYE DROPS OP) Place 2 drops into both eyes daily as needed (dry eyes).   cetirizine (ZYRTEC) 10 MG tablet Take 10 mg by mouth daily as needed for allergies.   Cholecalciferol (VITAMIN D3) 25 MCG (1000 UT) CAPS Take 1,000 Units by mouth daily.   dexamethasone (DECADRON) 0.5 MG/5ML solution Take 0.5 mg by mouth daily as needed (sore gums).   diltiazem (CARDIZEM CD) 180 MG 24 hr capsule Take 180 mg by mouth every evening.   ELIQUIS  5 MG TABS tablet TAKE 1 TABLET BY MOUTH TWICE A DAY   flecainide   (TAMBOCOR ) 50 MG tablet Take 1 tablet (50 mg total) by mouth 2 (two) times daily.   losartan  (COZAAR ) 100 MG tablet Take 1 tablet (100 mg total) by mouth daily.   methimazole  (TAPAZOLE ) 5 MG tablet Take 1 tablet (5 mg total) by mouth every other day.   Multiple Vitamin (MULTIVITAMIN WITH MINERALS) TABS tablet Take 1 tablet by mouth daily.   rosuvastatin (CRESTOR) 5 MG tablet TAKE 1 TABLET BY MOUTH EVERY DAY   spironolactone  (ALDACTONE ) 25 MG tablet Take 1 tablet (25 mg total) by mouth daily.   No facility-administered medications prior to visit.    Review of Systems  Constitutional:  Positive for fatigue. Negative for fever.  Respiratory:  Negative for cough and shortness of breath.   Cardiovascular:  Negative for chest pain and leg swelling.  Gastrointestinal:  Negative for abdominal pain.  Neurological:  Positive for tremors. Negative for dizziness and headaches.       Objective    BP 130/66   Pulse 60   Ht 5' 3 (1.6 m)   Wt 160 lb 3.2 oz (72.7 kg)   SpO2 100%   BMI 28.38 kg/m    Physical Exam Constitutional:      General: She is awake.     Appearance: She is well-developed.  HENT:     Head: Normocephalic.  Eyes:     Conjunctiva/sclera: Conjunctivae normal.  Cardiovascular:     Rate and Rhythm: Normal  rate and regular rhythm.     Heart sounds: Normal heart sounds.  Pulmonary:     Effort: Pulmonary effort is normal.     Breath sounds: Normal breath sounds.  Skin:    General: Skin is warm.  Neurological:     Mental Status: She is alert and oriented to person, place, and time.     Comments: No visible tremors at rest or with intention  Psychiatric:        Attention and Perception: Attention normal.        Mood and Affect: Mood normal.        Speech: Speech normal.        Behavior: Behavior is cooperative.      No results found for any visits on 02/03/24.  Assessment & Plan    Hyponatremia -     Basic metabolic panel with GFR  Graves disease -     TSH -      T4, free  Hypoglycemia, unspecified -     Hemoglobin A1c   Last bmp w/ mild hyponatremia and slight AKI. Stressing electrolyte repletion aside from just fluid intake. Discussed various forms of liquid electrolytes.  -repeat bmp  Tremors and Weakness Consistent w/ hypoglycemic symptoms.  Recommended proactive meal planning and snacks to prevent hypoglycemic episodes. Consider glucose monitoring if necessary, but pt may over-analyze a CGM. - check random glucose today, A1c. Given h/o graves and tremors, check tsh/t4   Return if symptoms worsen or fail to improve.       Trenton Frock, PA-C  [facility] at [facility] [phone] (phone) [phone] (fax)  [facility]

## 2024-02-04 ENCOUNTER — Ambulatory Visit: Admitting: Physician Assistant

## 2024-02-04 ENCOUNTER — Ambulatory Visit: Payer: Self-pay | Admitting: Physician Assistant

## 2024-02-04 LAB — BASIC METABOLIC PANEL WITH GFR
BUN/Creatinine Ratio: 17 (calc) (ref 6–22)
BUN: 23 mg/dL (ref 7–25)
CO2: 27 mmol/L (ref 20–32)
Calcium: 9.1 mg/dL (ref 8.6–10.4)
Chloride: 100 mmol/L (ref 98–110)
Creat: 1.32 mg/dL — ABNORMAL HIGH (ref 0.60–1.00)
Glucose, Bld: 99 mg/dL (ref 65–99)
Potassium: 4.5 mmol/L (ref 3.5–5.3)
Sodium: 136 mmol/L (ref 135–146)
eGFR: 41 mL/min/{1.73_m2} — ABNORMAL LOW (ref >=60)

## 2024-02-04 LAB — HEMOGLOBIN A1C
Hgb A1c MFr Bld: 5.6 % (ref <5.7)
Mean Plasma Glucose: 114 mg/dL
eAG (mmol/L): 6.3 mmol/L

## 2024-02-04 LAB — TSH: TSH: 1.77 m[IU]/L (ref 0.40–4.50)

## 2024-02-04 LAB — T4, FREE: Free T4: 1.2 ng/dL (ref 0.8–1.8)

## 2024-02-10 ENCOUNTER — Encounter: Payer: Self-pay | Admitting: Nurse Practitioner

## 2024-02-10 ENCOUNTER — Ambulatory Visit: Attending: Nurse Practitioner | Admitting: Nurse Practitioner

## 2024-02-10 VITALS — BP 162/82 | HR 67 | Ht 63.0 in | Wt 161.5 lb

## 2024-02-10 DIAGNOSIS — E782 Mixed hyperlipidemia: Secondary | ICD-10-CM

## 2024-02-10 DIAGNOSIS — I10 Essential (primary) hypertension: Secondary | ICD-10-CM

## 2024-02-10 DIAGNOSIS — I480 Paroxysmal atrial fibrillation: Secondary | ICD-10-CM

## 2024-02-10 DIAGNOSIS — I272 Pulmonary hypertension, unspecified: Secondary | ICD-10-CM

## 2024-02-10 DIAGNOSIS — R531 Weakness: Secondary | ICD-10-CM

## 2024-02-10 MED ORDER — DILTIAZEM HCL ER COATED BEADS 240 MG PO CP24: 240.0000 mg | ORAL_CAPSULE | Freq: Every evening | ORAL | 3 refills | Status: AC

## 2024-02-10 NOTE — Progress Notes (Signed)
 Office Visit    Patient Name: Doris [name] Date of Encounter: 02/10/2024  Primary Care Provider:  Trenton Frock, PA-C Primary Cardiologist:  Antionette Kirks, MD  Chief Complaint    78 y.o. female with a history of paroxysmal atrial fibrillation, PSVT, pulmonary hypertension, essential hypertension, hyperlipidemia, and hyperthyroidism, who presents for follow-up related to paroxysmal atrial fibrillation.  Past Medical History   Subjective   Past Medical History:  Diagnosis Date   Colitis    Graves disease    Hyperlipidemia    Hypertension    Hyperthyroidism    PAF (paroxysmal atrial fibrillation) (HCC)    a. Dx 03/2019 -->AF RVR @ [facility]; b. 04/2019 Zio: <1% Afib burden; c. CHA2DS2VASc = 4-->Eliquis ; d. 12/2023 ETT Ex 6:11 (5.3 METs). 78% MPHR acheived. Mild QRS widening ( ->120 msec); e. 01/2024 Zio: predominantly sinus rhythm at an average rate of 62 with 1 brief run of SVT and rare PACs and PVCs.   PAH (pulmonary artery hypertension) (HCC)    a. 05/2017 Echo: EF 60-65%, PASP ; b. 04/2019 Echo: EF 60-65% PASP ; c. 10/2021 Echo: EF 60-65%, RVSP 42.22mmHg; d. 11/2022 Echo: EF 55-60%, RVSP 44.7 mmHg; e. 12/2023 Echo: EF 60-65%, no rwma, nl RV fxn, RVSP 55.15mmHg. Triv MR; f. 01/2024 RHC: PA 30/8 (17). RVSP 43/2 - elev RV pressure due to mild PV stenosis.   Palpitations    a. 05/2017 Echo: EF 60-65%; b. 05/2017 48h Holter: Rare PACs and PVCs. 11 beat run of SVT w/ aberrancy; c. 04/2019 Zio: Avg HR 56. 37 episodes of SVT, longest 19. Intermittent AF (<1% burden, longest 1:39). Rare PVCs.   Positive TB test    Pulmonic valve stenosis    a. 01/2024 RHC: PA 30/8 (17), PV syst gradient of .  RV 43/24mmHg. Elev RV pressure due to mild PV stenosis.   Valvular heart disease    a. 10/2021 Echo: Mild MR, Mod TR.   Past Surgical History:  Procedure Laterality Date   CESAREAN SECTION     x 2   EXCISION METACARPAL MASS Right 12/17/2017   Procedure: EXCISION METACARPAL MASS;   Surgeon: Molli Angelucci, MD;  Location: [facility] ORS;  Service: Orthopedics;  Laterality: Right;   RIGHT HEART CATH Right 01/25/2024   Procedure: RIGHT HEART CATH;  Surgeon: Wenona Hamilton, MD;  Location: [facility] INVASIVE CV LAB;  Service: Cardiovascular;  Laterality: Right;    Allergies  No Known Allergies     History of Present Illness      78 y.o. y/o female with the above past medical history including paroxysmal atrial fibrillation, PSVT, pulmonary hypertension, primary hypertension, hyperlipidemia, and hyperthyroidism.  She was previously evaluated in late 2018 for palpitations with monitoring at that time showing rare PACs and PVCs and an 11 beat run of SVT with aberrancy. Echocardiogram in October 2018 showed normal LV function with grade 1 diastolic dysfunction, as well as pulmonary hypertension with a PASP of 38 mmHg. She was diagnosed with atrial fibrillation following presentation to [facility] in August 2020. Subsequent monitoring showed an A-fib burden of less than 1% with the longest episode lasting 1 minute and 39 seconds. 37 runs of SVT were also noted. She has been managed with beta-blocker and Eliquis  therapy. In the setting of pulmonary hypertension, she has been followed by pulmonology, and has been wearing oxygen at night.   In February 2025, patient was evaluated at the [facility] emergency department in the setting of A-fib with RVR without clear trigger.  ED workup was relatively unremarkable.  TSH was mildly elevated at 4.6.  Rate improved with IV diltiazem and she was subsequently transitioned to p.o. diltiazem.  She followed-up with electrophysiology in March 2025 and was placed on flecainide  50 mg twice daily.  She preferred to avoid an invasive procedure.  Follow-up ECG on April 9 showed stable intervals.      In May 2025, she reported episodes of weakness and fatigue often associated with GI symptoms described as a nervous stomach.  Symptoms preceded use of flecainide  but  became more frequent following initiation.  Event monitoring showed predominantly sinus rhythm at an average rate of 62 with 1 brief run of SVT and rare PACs and PVCs.  Echocardiogram in May 2025 showed an EF of 60-65% without regional wall motion abnormalities, and elevated RVSP of 55.7 mmHg.  In light of apparent worsening of pulmonary hypertension, right heart catheterization was performed in June 2025 showing an RV pressure of 43/2 with a PA pressure of 30/8 (17).  The pulmonary valve systolic gradient was 13 mmHg.  It was felt that the elevation of RV pressure was secondary to mild pulmonic stenosis, not pulmonary hypertension.  Unfortunately, Doris [name] has continued to have episodes of malaise, weakness, and GI upset.  She says she has some good days but symptoms linger for most of the week.  As previously noted, though symptoms seem to start prior to initiating flecainide , they are much worse since.  She is interested in holding flecainide  to see if symptoms improve.  She does not believe that she has had any recurrence of atrial fibrillation.  She has some degree of chronic, stable dyspnea on exertion.  She denies chest pain, palpitations, PND, orthopnea, dizziness, syncope, edema, or early satiety. Objective   Home Medications    Current Outpatient Medications  Medication Sig Dispense Refill   acetaminophen  (TYLENOL ) 325 MG tablet Take 325 mg by mouth every 6 (six) hours as needed for headache.      Carboxymethylcellul-Glycerin (LUBRICATING EYE DROPS OP) Place 2 drops into both eyes daily as needed (dry eyes).     cetirizine (ZYRTEC) 10 MG tablet Take 10 mg by mouth daily as needed for allergies.     Cholecalciferol (VITAMIN D3) 25 MCG (1000 UT) CAPS Take 1,000 Units by mouth daily.     dexamethasone (DECADRON) 0.5 MG/5ML solution Take 0.5 mg by mouth daily as needed (sore gums).     ELIQUIS  5 MG TABS tablet TAKE 1 TABLET BY MOUTH TWICE A DAY 180 tablet 1   losartan  (COZAAR ) 100 MG tablet  Take 1 tablet (100 mg total) by mouth daily. 90 tablet 3   methimazole  (TAPAZOLE ) 5 MG tablet Take 1 tablet (5 mg total) by mouth every other day. 45 tablet 3   Multiple Vitamin (MULTIVITAMIN WITH MINERALS) TABS tablet Take 1 tablet by mouth daily.     rosuvastatin (CRESTOR) 5 MG tablet TAKE 1 TABLET BY MOUTH EVERY DAY 90 tablet 0   spironolactone  (ALDACTONE ) 25 MG tablet Take 1 tablet (25 mg total) by mouth daily. 90 tablet 0   diltiazem (CARDIZEM CD) 240 MG 24 hr capsule Take 1 capsule (240 mg total) by mouth every evening. 90 capsule 3   No current facility-administered medications for this visit.     Physical Exam    VS:  BP (!) 162/82   Pulse 67   Ht 5' 3 (1.6 m)   Wt 161 lb 8 oz (73.3 kg)   SpO2 99%  BMI 28.61 kg/m  , BMI Body mass index is 28.61 kg/m.     Vitals:   02/10/24 1351 02/10/24 1438  BP: (!) 148/70 (!) 162/82  Pulse: 67   SpO2: 99%       GEN: Well nourished, well developed, in no acute distress. HEENT: normal. Neck: Supple, no JVD, carotid bruits, or masses. Cardiac: RRR, 2/6 systolic murmur at the upper sternal borders, no rubs or gallops. No clubbing, cyanosis, edema.  Radials 2+/PT 2+ and equal bilaterally.  Respiratory:  Respirations regular and unlabored, clear to auscultation bilaterally. GI: Soft, nontender, nondistended, BS + x 4. MS: no deformity or atrophy. Skin: warm and dry, no rash. Neuro:  Strength and sensation are intact. Psych: Normal affect.  Accessory Clinical Findings    ECG personally reviewed by me today - EKG Interpretation Date/Time:  Wednesday February 10 2024 13:56:18 EDT Ventricular Rate:  67 PR Interval:  138 QRS Duration:  74 QT Interval:  390 QTC Calculation: 412 R Axis:   -13  Text Interpretation: Normal sinus rhythm Normal ECG Confirmed by Laneta Pintos [phone]) on 02/10/2024 2:20:36 PM  - no acute changes.  Lab Results  Component Value Date   WBC 9.2 01/14/2024   HGB 11.6 (L) 01/25/2024   HCT 34.0 (L)  01/25/2024   MCV 96 01/14/2024   PLT 240 01/14/2024   Lab Results  Component Value Date   CREATININE 1.32 (H) 02/03/2024   BUN 23 02/03/2024   NA 136 02/03/2024   K 4.5 02/03/2024   CL 100 02/03/2024   CO2 27 02/03/2024   Lab Results  Component Value Date   ALT 14 06/07/2015   AST 17 06/07/2015   ALKPHOS 65 06/07/2015   BILITOT 1.0 06/07/2015   Lab Results  Component Value Date   LDLDIRECT 74.0 06/07/2015    Lab Results  Component Value Date   HGBA1C 5.6 02/03/2024   Lab Results  Component Value Date   TSH 1.77 02/03/2024       Assessment & Plan    1.  Paroxysmal atrial fibrillation: Placed on flecainide  earlier this year without recurrence noted on recent event monitoring.  Unfortunately, she believes that since starting flecainide , she has had progression of GI symptoms and weakness.  We agreed to hold flecainide  to see if symptoms improve.  I am increasing her diltiazem CD to 240 mg daily.  She remains anticoagulated on Eliquis .  I will have her follow-up with electrophysiology to reevaluate symptoms and determine if alternate antiarrhythmic appropriate.  2.  Weakness/fatigue: Patient with ongoing fatigue, weakness, and GI discomfort and upset in the setting of flecainide  therapy.  Holding as outlined above.  3.  Primary hypertension: Blood pressure elevated today on 2 consecutive readings.  Increasing diltiazem CD to 240 mg daily in the setting of above.  4.  Hyperlipidemia: On statin therapy.  5.  Pulmonary hypertension/mild pulmonic stenosis: Recent echo with increase in RVSP to 55.7 mmHg.  At the recommendation of her pulmonologist, right heart cardiac catheterization was performed last month showing a PA of 30/8 (17), and RV of 43/2.  She was noted to have mild pulmonic valvular stenosis resulting in elevated RVSP without significant pulmonary hypertension.  6.  Disposition: Follow-up with electrophysiology in approximately 2 to 3 weeks to evaluate symptoms off  of flecainide .  Laneta Pintos, NP 02/10/2024, 5:31 PM

## 2024-02-10 NOTE — Patient Instructions (Signed)
 Medication Instructions:  Your physician recommends the following medication changes.  STOP TAKING: Flecainide    INCREASE: Diltiazem to 240 mg once daily in the evening.   *If you need a refill on your cardiac medications before your next appointment, please call your pharmacy*  Lab Work: None ordered at this time  If you have labs (blood work) drawn today and your tests are completely normal, you will receive your results only by: MyChart Message (if you have MyChart) OR A paper copy in the mail If you have any lab test that is abnormal or we need to change your treatment, we will call you to review the results.  Testing/Procedures: None ordered at this time   Follow-Up: At [facility], you and your health needs are our priority.  As part of our continuing mission to provide you with exceptional heart care, our providers are all part of one team.  This team includes your primary Cardiologist (physician) and Advanced Practice Providers or APPs (Physician Assistants and Nurse Practitioners) who all work together to provide you with the care you need, when you need it.  Your next appointment:   2 - 3 week(s)  Provider:   Suzann Riddle, NP    We recommend signing up for the patient portal called MyChart.  Sign up information is provided on this After Visit Summary.  MyChart is used to connect with patients for Virtual Visits (Telemedicine).  Patients are able to view lab/test results, encounter notes, upcoming appointments, etc.  Non-urgent messages can be sent to your provider as well.   To learn more about what you can do with MyChart, go to [email or web address].

## 2024-02-12 ENCOUNTER — Other Ambulatory Visit: Payer: Self-pay | Admitting: Cardiology

## 2024-02-24 ENCOUNTER — Ambulatory Visit: Attending: Cardiology | Admitting: Cardiology

## 2024-02-24 VITALS — BP 140/80 | HR 64 | Ht 63.0 in | Wt 161.4 lb

## 2024-02-24 DIAGNOSIS — D6869 Other thrombophilia: Secondary | ICD-10-CM | POA: Diagnosis not present

## 2024-02-24 DIAGNOSIS — I480 Paroxysmal atrial fibrillation: Secondary | ICD-10-CM | POA: Diagnosis not present

## 2024-02-24 NOTE — Progress Notes (Signed)
 Electrophysiology Clinic Note    Date:  02/24/2024  Patient ID:  Doris [name], Doris [name] 17-Dec-1945, MRN [id number] PCP:  Cyndi Shaver, PA-C  Cardiologist:  Deatrice Cage, MD Electrophysiologist: Soyla Gladis Norton, MD  Discussed the use of AI scribe software for clinical note transcription with the patient, who gave verbal consent to proceed.   Patient Profile    Chief Complaint: AF follow-up  History of Present Illness: Doris [name] is a 78 y.o. female with PMH notable for parox AFib, SVT, pulm HTN, HTN, Graves' disease; seen today for Will Gladis Norton, MD for routine electrophysiology followup.   She last saw Dr. Norton 10/2023 remotely had seen Dr. Fernande in 2021.  At the visit with Dr. Norton she does not scribed ongoing episodic palpitations and preferred a rhythm control strategy.  Flecainide  50 mg twice daily was started.   She saw NP Vivienne 12/2023 where she complained about intermittent periods of weakness and fatigue associated with GI discomfort, she was fairly sure that the symptoms had started prior to starting flecainide , but seem to occur more often since starting flecainide .  She requested to continue flecainide  and they agreed to do a 1 week monitor to see if arrhythmias were related to her symptoms. She again saw NP Vivienne after updated TTE showed worsening pulm  HTN. She had 1 weak spell while wearing monitor that did not correlate to significant arrhythmia  She again saw NP Vivienne 01/2024 where she continued to have episodes of malaise, weakness, and GI upset, and requested to stop flecainide  to see if symptoms improved. Dilt increased to 240mg  daily  On follow-up today, she continued to have her malaise episodes for about 3 to 5 days after stopping the flecainide , but has since not had any episodes and feels 100% back to normal.  She has not had any A-fib episodes since stopping flecainide .  She continues to take Eliquis  twice daily without any missed doses.  No  bleeding concerns on flecainide . She has noticed that her blood pressure has dropped slightly since increasing diltiazem , most readings at home are still elevated though.  She cannot recall whether she checks her blood pressure before or after medicines.  Her husband joins for appointment.    Arrhythmia/Device History Flecainide  - stopped d/t SE     ROS:  Please see the history of present illness. All other systems are reviewed and otherwise negative.    Physical Exam    VS:  BP (!) 140/80 (BP Location: Left Arm, Patient Position: Sitting, Cuff Size: Normal)   Pulse 64   Ht 5' 3 (1.6 m)   Wt 161 lb 6.4 oz (73.2 kg)   SpO2 98%   BMI 28.59 kg/m  BMI: Body mass index is 28.59 kg/m.      Wt Readings from Last 3 Encounters:  02/24/24 161 lb 6.4 oz (73.2 kg)  02/10/24 161 lb 8 oz (73.3 kg)  02/03/24 160 lb 3.2 oz (72.7 kg)     GEN- The patient is well appearing, alert and oriented x 3 today.   Lungs- Clear to ausculation bilaterally, normal work of breathing.  Heart- Regular rate and rhythm, 2/6 murmur at LSB, rubs or gallops Extremities- No peripheral edema, warm, dry    Studies Reviewed   Previous EP, cardiology notes.    EKG is not ordered. Personal review of EKG from 02/10/2024 shows:  SR at 67bpm         Long term monitor, 02/02/2024 Patient had a  min HR of 46 bpm, max HR of 143 bpm, and avg HR of 62 bpm. Predominant underlying rhythm was Sinus Rhythm.  1 run of Supraventricular Tachycardia occurred lasting 18 beats with a max rate of 143 bpm (avg 110 bpm).  Rare PACs and PVCs.  ETT, 01/19/2024   Baseline ECG demonstrates normal sinus rhythm without significant abnormalities.   The patient exhibits reduced functional capacity, requiring modification of the Bruce protocol to exercise 6:11 minutes (5.3 METS).  Target heart rate was not achieved (max HR 111 bpm; 78% MPHR).  She reported fatigue but no angina.   No significant ST segment or T wave abnormalities  were observed.   QRS widening was noted during stress (QRS 100 -> 120 ms).  No significant arrhythmia was observed.   Study is non-diagnostic for ischemia due to failure to achieve target heart rate.  TTE, 01/12/2024  1. Left ventricular ejection fraction, by estimation, is 60 to 65%. The left ventricle has normal function. The left ventricle has no regional wall motion abnormalities. Left ventricular diastolic parameters were normal.   2. Right ventricular systolic function is normal. The right ventricular size is normal. There is moderately elevated pulmonary artery systolic pressure. The estimated right ventricular systolic pressure is 55.7 mmHg.   3. The mitral valve is grossly normal. Trivial mitral valve regurgitation. No evidence of mitral stenosis.   4. The aortic valve is tricuspid. There is moderate thickening of the aortic valve. Aortic valve regurgitation is not visualized. No aortic stenosis is present. Aortic valve Vmax measures 1.49 m/s.   5. The inferior vena cava is normal in size with greater than 50% respiratory variability, suggesting right atrial pressure of 3 mmHg.   Comparison(s): Echocardiogram done 12/19/22 showed an EF of 55-60%, RVSP was 440.56mm Hg.    Assessment and Plan     #) parox AFib Historically has been very symptomatic during Afib episodes Recently started on flecainide  with worsening of weakness and GI distress episodes, she has since stopped flecainide  with improvement in her SE symptoms, has not had any recurrence of AFib She would like to continue to monitor her AF burden at this time off AAD, which I think is very reasonable She remains uninterested in AF ablation Continue 240mg  diltiazem  daily   #) Hypercoag d/t parox afib CHA2DS2-VASc Score = at least 4 [CHF History: 0, HTN History: 1, Diabetes History: 0, Stroke History: 0, Vascular Disease History: 0, Age Score: 2, Gender Score: 1].  Therefore, the patient's annual risk of stroke is 4.8 %.     Stroke ppx - 5mg  eliquis  BID, appropriately dosed No bleeding concerns  #) HTN Elevated in office today, but home readings have improved with increased dose of diltiazem  Continue 240mg  dilt daily Continue 100mg  losartan       Current medicines are reviewed at length with the patient today.   The patient does not have concerns regarding her medicines.  The following changes were made today:  none  Labs/ tests ordered today include:  No orders of the defined types were placed in this encounter.    Disposition: Follow up with Dr. Inocencio or EP APP in 3 months, sooner if needed   Signed, Miriya Cloer, NP  02/24/24  3:04 PM  Electrophysiology [facility]

## 2024-02-24 NOTE — Patient Instructions (Signed)
 Medication Instructions:  The current medical regimen is effective;  continue present plan and medications as directed. Please refer to the Current Medication list given to you today.   *If you need a refill on your cardiac medications before your next appointment, please call your pharmacy*  Follow-Up: At [facility], you and your health needs are our priority.  As part of our continuing mission to provide you with exceptional heart care, our providers are all part of one team.  This team includes your primary Cardiologist (physician) and Advanced Practice Providers or APPs (Physician Assistants and Nurse Practitioners) who all work together to provide you with the care you need, when you need it.  Your next appointment:   Keep appointment as scheduled (if you feel great, you can cancel the appointment, but if you have AFIB episode please call us  and we can get you in sooner)   We recommend signing up for the patient portal called MyChart.  Sign up information is provided on this After Visit Summary.  MyChart is used to connect with patients for Virtual Visits (Telemedicine).  Patients are able to view lab/test results, encounter notes, upcoming appointments, etc.  Non-urgent messages can be sent to your provider as well.   To learn more about what you can do with MyChart, go to [email or web address].

## 2024-02-29 ENCOUNTER — Telehealth: Payer: Self-pay | Admitting: Nurse Practitioner

## 2024-02-29 NOTE — Telephone Encounter (Signed)
 Pt is requesting a callback regarding her wanting a callback about being sick lately and adjustments being made to her medications. Please advise

## 2024-02-29 NOTE — Telephone Encounter (Signed)
 Spoke with patient who reported not feeling well yesterday and today. She stated she had a headache this morning, felt off balance and "off," and noted her blood pressure was 158/81 with a heart rate of 77. Patient expressed concern that both readings were too high, so she took a half dose of Metoprolol  (12.5 mg), after which her blood pressure decreased to 144/82 and heart rate to 57.  Patient reported she has atrial fibrillation and is worried her heart rate might continue to rise due to recent changes in her medication. She stated that diltiazem  helps and she does not feel palpitations but remains concerned about her blood pressure and would like Dr. Renato recommendations.  Will forward to MD for recommendations as pt specifically requested his input.

## 2024-03-02 NOTE — Telephone Encounter (Signed)
 Call placed to the patient. She stated that for the last few day she has felt better and her blood pressure and heart rate was better. Her blood pressure today was 122/64.   She would like to know if she can continue to take Tartrate 12.5 mg once daily as needed. The last time she took it was in March. She stated that she has been stressed lately as well and that this has contributed to the increase in blood pressure.   She has an appointment 8/8 with Dr. Darron and can discuss it further. She has been advised to call back sooner if needed.

## 2024-03-02 NOTE — Telephone Encounter (Signed)
 I think it is reasonable to add metoprolol  to tartrate 12.5 mg twice daily and continue diltiazem .  She should reconsider ablation if she is having symptomatic atrial fibrillation.

## 2024-03-03 NOTE — Telephone Encounter (Signed)
 Yes, she can use metoprolol  as needed.

## 2024-03-03 NOTE — Telephone Encounter (Signed)
 Patient aware.

## 2024-03-18 ENCOUNTER — Other Ambulatory Visit: Payer: Self-pay | Admitting: Cardiovascular Disease

## 2024-03-22 ENCOUNTER — Encounter

## 2024-03-25 ENCOUNTER — Other Ambulatory Visit: Payer: Self-pay | Admitting: Physician Assistant

## 2024-03-27 ENCOUNTER — Other Ambulatory Visit: Payer: Self-pay | Admitting: Cardiovascular Disease

## 2024-03-27 DIAGNOSIS — I480 Paroxysmal atrial fibrillation: Secondary | ICD-10-CM

## 2024-03-28 NOTE — Telephone Encounter (Signed)
 Prescription refill request for Eliquis  received. Indication:afib Last office visit:7/25 Scr:1.32  6/25 Age: 78 Weight:73.2  kg  Prescription refilled

## 2024-03-29 NOTE — Telephone Encounter (Unsigned)
 Copied from CRM #[id number]. Topic: Appointments - Transfer of Care >> Mar 29, 2024  4:41 PM Tiffini S wrote: Pt is requesting to transfer FROM: Cyndi Shaver, PA-C Pt is requesting to transfer TO: Copland, Harlene BROCKS Reason for requested transfer: Transfer providers, received a letter and wants MD as provider  It is the responsibility of the team the patient would like to transfer to (Dr. Watt, Harlene C) to reach out to the patient if for any reason this transfer is not acceptable.

## 2024-04-01 ENCOUNTER — Encounter: Payer: Self-pay | Admitting: Cardiovascular Disease

## 2024-04-01 ENCOUNTER — Ambulatory Visit: Attending: Cardiovascular Disease | Admitting: Cardiovascular Disease

## 2024-04-01 VITALS — BP 126/72 | HR 65 | Ht 63.0 in | Wt 162.8 lb

## 2024-04-01 DIAGNOSIS — I480 Paroxysmal atrial fibrillation: Secondary | ICD-10-CM

## 2024-04-01 DIAGNOSIS — I10 Essential (primary) hypertension: Secondary | ICD-10-CM | POA: Diagnosis not present

## 2024-04-01 DIAGNOSIS — E785 Hyperlipidemia, unspecified: Secondary | ICD-10-CM

## 2024-04-01 DIAGNOSIS — I370 Nonrheumatic pulmonary valve stenosis: Secondary | ICD-10-CM | POA: Diagnosis not present

## 2024-04-01 DIAGNOSIS — E0590 Thyrotoxicosis, unspecified without thyrotoxic crisis or storm: Secondary | ICD-10-CM

## 2024-04-01 NOTE — Patient Instructions (Signed)
 Medication Instructions:  No changes *If you need a refill on your cardiac medications before your next appointment, please call your pharmacy*  Lab Work: None ordered If you have labs (blood work) drawn today and your tests are completely normal, you will receive your results only by: MyChart Message (if you have MyChart) OR A paper copy in the mail If you have any lab test that is abnormal or we need to change your treatment, we will call you to review the results.  Testing/Procedures: None ordered  Follow-Up: At [facility], you and your health needs are our priority.  As part of our continuing mission to provide you with exceptional heart care, our providers are all part of one team.  This team includes your primary Cardiologist (physician) and Advanced Practice Providers or APPs (Physician Assistants and Nurse Practitioners) who all work together to provide you with the care you need, when you need it.  Your next appointment:   6 month(s)  Provider:   You may see Deatrice Cage, MD or one of the following Advanced Practice Providers on your designated Care Team:   Lonni Meager, NP Lesley Maffucci, PA-C Bernardino Bring, PA-C Cadence Mishawaka, PA-C Tylene Lunch, NP Barnie Hila, NP    We recommend signing up for the patient portal called MyChart.  Sign up information is provided on this After Visit Summary.  MyChart is used to connect with patients for Virtual Visits (Telemedicine).  Patients are able to view lab/test results, encounter notes, upcoming appointments, etc.  Non-urgent messages can be sent to your provider as well.   To learn more about what you can do with MyChart, go to [email or web address].   Other Instructions PCP-Megan Vicci [phone]

## 2024-04-01 NOTE — Progress Notes (Signed)
 Cardiology Office Note   Date:  04/01/2024   ID:  Doris, [name] 1946/06/09, MRN [id number]  PCP:  Cyndi Shaver, PA-C  Cardiologist:   Deatrice Cage, MD   Chief Complaint  Patient presents with   Follow-up    Follow up Paroxysmal atrial fibrillation, patient denies any shortness of breath or chest pain.      History of Present Illness: Doris [name] is a 78 y.o. female who presents for  a followup visit regarding paroxysmal atrial fibrillation, mild pulmonary valve stenosis and essential hypertension.  She has known history of essential hypertension, hyperlipidemia and hyperthyroidism. She was diagnosed with atrial fibrillation in August 2020 after she presented to [facility] ED with palpitations.  Her heart rate was 169 bpm.  She was discharged on small dose metoprolol  and Eliquis .  Echocardiogram in September 2020 showed an EF of 60 to 65% with mild pulmonary hypertension.  She had hyponatremia with hydrochlorothiazide  and was subsequently switched to spironolactone .  She had recent issues with increased palpitations and thus she was placed on flecainide  but felt significantly worse and thus stopped the medication.  Her symptoms have been well-controlled with diltiazem .  She had an echocardiogram done in May which showed normal LV systolic function with moderate pulmonary hypertension with peak systolic pressure of 56 mmHg.  Due to pulmonary hypertension, she underwent a right heart catheterization in June which showed no significant pulmonary hypertension with normal pulmonary vascular resistance.  RV systolic pressure was elevated but it was due to mild pulmonary valve stenosis with peak gradient of 13 mmHg.  She had an outpatient monitor in June that showed no evidence of atrial fibrillation.  She is doing well now.  Past Medical History:  Diagnosis Date   Colitis    Graves disease    Hyperlipidemia    Hypertension    Hyperthyroidism    PAF (paroxysmal atrial  fibrillation) (HCC)    a. Dx 03/2019 -->AF RVR @ [facility]; b. 04/2019 Zio: <1% Afib burden; c. CHA2DS2VASc = 4-->Eliquis ; d. 12/2023 ETT Ex 6:11 (5.3 METs). 78% MPHR acheived. Mild QRS widening ( ->120 msec); e. 01/2024 Zio: predominantly sinus rhythm at an average rate of 62 with 1 brief run of SVT and rare PACs and PVCs.   PAH (pulmonary artery hypertension) (HCC)    a. 05/2017 Echo: EF 60-65%, PASP ; b. 04/2019 Echo: EF 60-65% PASP ; c. 10/2021 Echo: EF 60-65%, RVSP 42.10mmHg; d. 11/2022 Echo: EF 55-60%, RVSP 44.7 mmHg; e. 12/2023 Echo: EF 60-65%, no rwma, nl RV fxn, RVSP 55.52mmHg. Triv MR; f. 01/2024 RHC: PA 30/8 (17). RVSP 43/2 - elev RV pressure due to mild PV stenosis.   Palpitations    a. 05/2017 Echo: EF 60-65%; b. 05/2017 48h Holter: Rare PACs and PVCs. 11 beat run of SVT w/ aberrancy; c. 04/2019 Zio: Avg HR 56. 37 episodes of SVT, longest 19. Intermittent AF (<1% burden, longest 1:39). Rare PVCs.   Positive TB test    Pulmonic valve stenosis    a. 01/2024 RHC: PA 30/8 (17), PV syst gradient of .  RV 43/53mmHg. Elev RV pressure due to mild PV stenosis.   Valvular heart disease    a. 10/2021 Echo: Mild MR, Mod TR.    Past Surgical History:  Procedure Laterality Date   CESAREAN SECTION     x 2   EXCISION METACARPAL MASS Right 12/17/2017   Procedure: EXCISION METACARPAL MASS;  Surgeon: Kathlynn Sharper, MD;  Location: [facility] ORS;  Service: Orthopedics;  Laterality: Right;   RIGHT HEART CATH Right 01/25/2024   Procedure: RIGHT HEART CATH;  Surgeon: Darron Deatrice LABOR, MD;  Location: [facility] INVASIVE CV LAB;  Service: Cardiovascular;  Laterality: Right;     Current Outpatient Medications  Medication Sig Dispense Refill   acetaminophen  (TYLENOL ) 325 MG tablet Take 325 mg by mouth every 6 (six) hours as needed for headache.      Carboxymethylcellul-Glycerin (LUBRICATING EYE DROPS OP) Place 2 drops into both eyes daily as needed (dry eyes).     cetirizine (ZYRTEC) 10 MG tablet Take 10 mg by  mouth daily as needed for allergies.     Cholecalciferol (VITAMIN D3) 25 MCG (1000 UT) CAPS Take 1,000 Units by mouth daily.     dexamethasone (DECADRON) 0.5 MG/5ML solution Take 0.5 mg by mouth daily as needed (sore gums).     diltiazem  (CARDIZEM  CD) 240 MG 24 hr capsule Take 1 capsule (240 mg total) by mouth every evening. 90 capsule 3   ELIQUIS  5 MG TABS tablet TAKE 1 TABLET BY MOUTH TWICE A DAY 180 tablet 1   losartan  (COZAAR ) 100 MG tablet Take 1 tablet (100 mg total) by mouth daily. 90 tablet 3   methimazole  (TAPAZOLE ) 5 MG tablet Take 1 tablet (5 mg total) by mouth every other day. 45 tablet 3   metoprolol  tartrate (LOPRESSOR ) 25 MG tablet Take 12.5 mg by mouth daily as needed.     Multiple Vitamin (MULTIVITAMIN WITH MINERALS) TABS tablet Take 1 tablet by mouth daily.     rosuvastatin (CRESTOR) 5 MG tablet TAKE 1 TABLET BY MOUTH EVERY DAY 90 tablet 0   spironolactone  (ALDACTONE ) 25 MG tablet TAKE 1 TABLET (25 MG TOTAL) BY MOUTH DAILY. 90 tablet 3   No current facility-administered medications for this visit.    Allergies:   Patient has no known allergies.    Social History:  The patient  reports that she has never smoked. She has never used smokeless tobacco. She reports that she does not drink alcohol and does not use drugs.   Family History:  The patient's family history includes Breast cancer (age of onset: 69) in her mother; Throat cancer in her father; Thyroid  cancer in her daughter.    ROS:  Please see the history of present illness.   Otherwise, review of systems are positive for none.   All other systems are reviewed and negative.    PHYSICAL EXAM: VS:  BP 126/72 (BP Location: Left Arm, Patient Position: Sitting, Cuff Size: Normal)   Pulse 65   Ht 5' 3 (1.6 m)   Wt 162 lb 12.8 oz (73.8 kg)   SpO2 99%   BMI 28.84 kg/m  , BMI Body mass index is 28.84 kg/m. GEN: Well nourished, well developed, in no acute distress  HEENT: normal  Neck: no JVD, carotid bruits, or  masses Cardiac: RRR; no  rubs, or gallops,no edema . 1/ 6 systolic ejection murmur in the pulmonic area which is early peaking. Respiratory:  clear to auscultation bilaterally, normal work of breathing GI: soft, nontender, nondistended, + BS MS: no deformity or atrophy  Skin: warm and dry, no rash Neuro:  Strength and sensation are intact Psych: euthymic mood, full affect   EKG:  EKG is  ordered today. EKG showed: Normal sinus rhythm Normal ECG When compared with ECG of 10-Feb-2024 13:56, No significant change was found    Recent Labs: 01/14/2024: Platelets 240 01/25/2024: Hemoglobin 11.6 02/03/2024: BUN 23; Creat 1.32; Potassium 4.5; Sodium 136; TSH  1.77    Lipid Panel    Component Value Date/Time   LDLDIRECT 74.0 06/07/2015 1057      Wt Readings from Last 3 Encounters:  04/01/24 162 lb 12.8 oz (73.8 kg)  02/24/24 161 lb 6.4 oz (73.2 kg)  02/10/24 161 lb 8 oz (73.3 kg)      ASSESSMENT AND PLAN:  1.  Paroxysmal atrial fibrillation: Her symptoms are well-controlled with diltiazem  240 mg once daily.  Will consider ablation if she becomes more symptomatic.  Continue anticoagulation with Eliquis .  2.  Essential hypertension: Blood pressure is well-controlled on current medications.  3.  Hyperlipidemia: On rosuvastatin.  Most recent lipid profile in 2023 showed an LDL of 52.  4.  Hyperthyroidism: She is on small dose of methimazole .  Most recent T3 and T4 were normal in June.  5.  Pulmonary valve stenosis: This was mild on right heart catheterization and is a likely reason for elevated RV systolic pressure on echo.  No significant pulmonary hypertension.   Disposition:   FU with me in 6 months.  Signed,  Deatrice Cage, MD  04/01/2024 9:26 AM    [facility]

## 2024-04-11 ENCOUNTER — Ambulatory Visit: Admitting: Family Medicine

## 2024-05-16 ENCOUNTER — Ambulatory Visit: Admitting: Cardiology

## 2024-05-31 NOTE — Progress Notes (Signed)
    Assessment & Plan:  1. Other form of dyspnea (Primary)   Patient Instructions  We will obtain an overnight oxygent test.   We will see you back in 3-4 weeks.   Please note: late entry documentation due to logistical difficulties during COVID-19 pandemic. This note is filed for information purposes only, and is not intended to be used for billing, nor does it represent the full scope/nature of the visit in question. Please see any associated scanned media linked to date of encounter for additional pertinent information.  Subjective:    HPI: Doris [name] is a 78 y.o. female presenting to the pulmonology clinic on 06/21/2019 with report of: Consult (Referred by PA Bernardino Bring. Recently dx with A-fib. Denies all symptoms. PA suggested she get a sleep study. )     Outpatient Encounter Medications as of 06/21/2019  Medication Sig   acetaminophen  (TYLENOL ) 325 MG tablet Take 325 mg by mouth every 6 (six) hours as needed for headache.    Carboxymethylcellul-Glycerin (LUBRICATING EYE DROPS OP) Place 2 drops into both eyes daily as needed (dry eyes).   Multiple Vitamin (MULTIVITAMIN WITH MINERALS) TABS tablet Take 1 tablet by mouth daily.   [DISCONTINUED] amLODipine  (NORVASC ) 5 MG tablet TAKE 1 TABLET(5 MG) BY MOUTH DAILY   [DISCONTINUED] ELIQUIS  5 MG TABS tablet Take 1 tablet (5 mg total) by mouth 2 (two) times daily.   [DISCONTINUED] losartan -hydrochlorothiazide  (HYZAAR) 50-12.5 MG tablet TAKE 2 TABLETS BY MOUTH DAILY   [DISCONTINUED] metoprolol  tartrate (LOPRESSOR ) 25 MG tablet Take 0.5 tablets (12.5 mg total) by mouth 2 (two) times daily.   [DISCONTINUED] rosuvastatin (CRESTOR) 5 MG tablet Take 5 mg by mouth at bedtime.    No facility-administered encounter medications on file as of 06/21/2019.      Objective:   Vitals:   06/21/19 1548  BP: 120/80  Pulse: 68  Temp: (!) 97.5 F (36.4 C)  Height: 5' 3 (1.6 m)  Weight: 149 lb (67.6 kg)  SpO2: 100%  TempSrc: Temporal   BMI (Calculated): 26.4     Physical exam documentation is limited by delayed entry of information.

## 2024-06-04 ENCOUNTER — Other Ambulatory Visit: Payer: Self-pay | Admitting: Cardiovascular Disease

## 2024-06-08 ENCOUNTER — Ambulatory Visit
Admission: RE | Admit: 2024-06-08 | Discharge: 2024-06-08 | Disposition: A | Discharge status: Home / Self Care | Source: Ambulatory Visit | Attending: Physician Assistant | Admitting: Physician Assistant

## 2024-06-08 DIAGNOSIS — Z1231 Encounter for screening mammogram for malignant neoplasm of breast: Secondary | ICD-10-CM | POA: Insufficient documentation

## 2024-06-13 ENCOUNTER — Ambulatory Visit: Admitting: Cardiology

## 2024-06-13 ENCOUNTER — Other Ambulatory Visit: Payer: Self-pay | Admitting: Family Medicine

## 2024-06-13 DIAGNOSIS — R928 Other abnormal and inconclusive findings on diagnostic imaging of breast: Secondary | ICD-10-CM

## 2024-06-14 ENCOUNTER — Other Ambulatory Visit: Payer: Self-pay | Admitting: Cardiovascular Disease

## 2024-06-15 ENCOUNTER — Ambulatory Visit: Payer: Self-pay | Admitting: Family Medicine

## 2024-06-15 ENCOUNTER — Ambulatory Visit
Admission: RE | Admit: 2024-06-15 | Discharge: 2024-06-15 | Disposition: A | Discharge status: Home / Self Care | Source: Ambulatory Visit | Attending: Family Medicine | Admitting: Family Medicine

## 2024-06-15 DIAGNOSIS — R928 Other abnormal and inconclusive findings on diagnostic imaging of breast: Secondary | ICD-10-CM | POA: Insufficient documentation

## 2024-06-30 ENCOUNTER — Other Ambulatory Visit: Payer: Self-pay | Admitting: Family Medicine

## 2024-07-04 ENCOUNTER — Telehealth: Payer: Self-pay | Admitting: Cardiovascular Disease

## 2024-07-04 MED ORDER — ROSUVASTATIN CALCIUM 5 MG PO TABS: 5.0000 mg | ORAL_TABLET | Freq: Every day | ORAL | 0 refills | Status: DC

## 2024-07-04 NOTE — Telephone Encounter (Signed)
 Refill sent.

## 2024-07-04 NOTE — Telephone Encounter (Signed)
*  STAT* If patient is at the pharmacy, call can be transferred to refill team.   1. Which medications need to be refilled? (please list name of each medication and dose if known) rosuvastatin (CRESTOR) 5 MG tablet    2. Would you like to learn more about the convenience, safety, & potential cost savings by using the [facility]? No    3. Are you open to using the [facility] (Type [facility]. ) No   4. Which pharmacy/location (including street and city if local pharmacy) is medication to be sent to?  [id number] - [place], [place] - [address] Phone: [phone]           5. Do they need a 30 day or 90 day supply? 30 day  Pt is out of medication

## 2024-07-27 ENCOUNTER — Encounter: Payer: Self-pay | Admitting: Family Medicine

## 2024-07-27 ENCOUNTER — Ambulatory Visit: Admitting: Family Medicine

## 2024-07-27 VITALS — BP 149/79 | HR 69 | Temp 97.9°F | Ht 63.0 in | Wt 164.0 lb

## 2024-07-27 DIAGNOSIS — I480 Paroxysmal atrial fibrillation: Secondary | ICD-10-CM | POA: Insufficient documentation

## 2024-07-27 DIAGNOSIS — Z1159 Encounter for screening for other viral diseases: Secondary | ICD-10-CM

## 2024-07-27 DIAGNOSIS — E0500 Thyrotoxicosis with diffuse goiter without thyrotoxic crisis or storm: Secondary | ICD-10-CM

## 2024-07-27 DIAGNOSIS — Z780 Asymptomatic menopausal state: Secondary | ICD-10-CM

## 2024-07-27 DIAGNOSIS — I4891 Unspecified atrial fibrillation: Secondary | ICD-10-CM | POA: Diagnosis not present

## 2024-07-27 DIAGNOSIS — I10 Essential (primary) hypertension: Secondary | ICD-10-CM | POA: Diagnosis not present

## 2024-07-27 DIAGNOSIS — I272 Pulmonary hypertension, unspecified: Secondary | ICD-10-CM

## 2024-07-27 DIAGNOSIS — R739 Hyperglycemia, unspecified: Secondary | ICD-10-CM

## 2024-07-27 DIAGNOSIS — Z136 Encounter for screening for cardiovascular disorders: Secondary | ICD-10-CM

## 2024-07-27 LAB — MICROALBUMIN, URINE WAIVED
Creatinine, Urine Waived: 100 mg/dL (ref 10–300)
Microalb, Ur Waived: 30 mg/L — ABNORMAL HIGH (ref 0–19)
Microalb/Creat Ratio: <30 mg/g (ref <30)

## 2024-07-27 LAB — BAYER DCA HB A1C WAIVED: HB A1C (BAYER DCA - WAIVED): 5.5 % (ref 4.8–5.6)

## 2024-07-27 MED ORDER — ROSUVASTATIN CALCIUM 5 MG PO TABS: 5.0000 mg | ORAL_TABLET | Freq: Every day | ORAL | 1 refills | Status: AC

## 2024-07-27 MED ORDER — APIXABAN 5 MG PO TABS: 5.0000 mg | ORAL_TABLET | Freq: Two times a day (BID) | ORAL | 1 refills | Status: AC

## 2024-07-27 NOTE — Assessment & Plan Note (Signed)
 Running slightly high- will work on [employer] and continue to monitor at home. Call with any concerns.

## 2024-07-27 NOTE — Progress Notes (Signed)
 BP (!) 149/79   Pulse 69   Temp 97.9 F (36.6 C) (Oral)   Ht 5' 3 (1.6 m)   Wt 164 lb (74.4 kg)   SpO2 98%   BMI 29.05 kg/m    Subjective:    Patient ID: Doris [name], female    DOB: [birth year], 78 y.o.   MRN: [id number]  HPI: Doris [name] is a 78 y.o. female  Chief Complaint  Patient presents with   Establish Care   Hypertension   Hyperlipidemia   HYPERTENSION / HYPERLIPIDEMIA Satisfied with current treatment? yes Duration of hypertension: chronic BP medication side effects: no Past BP meds: metoprolol , losartan , diltiazem  Duration of hyperlipidemia: chronic Cholesterol medication side effects: no Cholesterol supplements: none Past cholesterol medications: crestor  Medication compliance: excellent compliance Aspirin: no Recent stressors: no Recurrent headaches: no Visual changes: no Palpitations: no Dyspnea: no Chest pain: no Lower extremity edema: no Dizzy/lightheaded: no  HYPERTHYROIDISM Thyroid  control status:controlled Satisfied with current treatment? yes Medication side effects: no Medication compliance: excellent compliance Recent dose adjustment:no Fatigue: no Cold intolerance: no Heat intolerance: no Weight gain: no Weight loss: no Constipation: no Diarrhea/loose stools: no Palpitations: no Lower extremity edema: no Anxiety/depressed mood: no  ATRIAL FIBRILLATION Atrial fibrillation status: stable Satisfied with current treatment: yes  Medication side effects:  no Medication compliance: excellent compliance Etiology of atrial fibrillation:  Palpitations:  yes Chest pain:  no Dyspnea on exertion:  no Orthopnea:  no Syncope:  no Edema:  no Ventricular rate control: B-blocker, diltiazem  Anti-coagulation: long acting   Active Ambulatory Problems    Diagnosis Date Noted   HTN (hypertension) 01/26/2014   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   Graves disease 01/11/2016   Pulmonary hypertension (HCC) 01/15/2023   Abnormal stools  09/04/2023   History of colitis 09/04/2023   PAF (paroxysmal atrial fibrillation) (HCC) 07/27/2024   Resolved Ambulatory Problems    Diagnosis Date Noted   Screening for breast cancer 06/11/2015   Screening for osteoporosis 06/11/2015   Encounter to establish care 06/11/2015   Diarrhea 12/13/2015   Subjective hearing change 09/03/2016   Atrial fibrillation with RVR (HCC) 08/12/2019   Chronic respiratory failure with hypoxia (HCC) 01/15/2023   Past Medical History:  Diagnosis Date   Colitis    Hyperlipidemia    Hypertension    Hyperthyroidism    PAH (pulmonary artery hypertension) (HCC)    Palpitations    Positive TB test    Pulmonic valve stenosis    Valvular heart disease    Past Surgical History:  Procedure Laterality Date   CESAREAN SECTION     x 2   EXCISION METACARPAL MASS Right 12/17/2017   Procedure: EXCISION METACARPAL MASS;  Surgeon: Kathlynn Sharper, MD;  Location: [facility] ORS;  Service: Orthopedics;  Laterality: Right;   RIGHT HEART CATH Right 01/25/2024   Procedure: RIGHT HEART CATH;  Surgeon: Darron Deatrice LABOR, MD;  Location: [facility] INVASIVE CV LAB;  Service: Cardiovascular;  Laterality: Right;   Outpatient Encounter Medications as of 07/27/2024  Medication Sig   acetaminophen  (TYLENOL ) 325 MG tablet Take 325 mg by mouth every 6 (six) hours as needed for headache.    Carboxymethylcellul-Glycerin (LUBRICATING EYE DROPS OP) Place 2 drops into both eyes daily as needed (dry eyes).   cetirizine (ZYRTEC) 10 MG tablet Take 10 mg by mouth daily as needed for allergies.   Cholecalciferol (VITAMIN D3) 25 MCG (1000 UT) CAPS Take 1,000 Units by mouth daily.   dexamethasone (DECADRON) 0.5 MG/5ML solution Take 0.5  mg by mouth daily as needed (sore gums).   diltiazem  (CARDIZEM  CD) 240 MG 24 hr capsule Take 1 capsule (240 mg total) by mouth every evening.   losartan  (COZAAR ) 100 MG tablet TAKE 1 TABLET BY MOUTH EVERY DAY   methimazole  (TAPAZOLE ) 5 MG tablet Take 1 tablet (5 mg total) by  mouth every other day.   metoprolol  tartrate (LOPRESSOR ) 25 MG tablet Take 12.5 mg by mouth daily as needed.   Multiple Vitamin (MULTIVITAMIN WITH MINERALS) TABS tablet Take 1 tablet by mouth daily.   spironolactone  (ALDACTONE ) 25 MG tablet Take 1 tablet (25 mg total) by mouth daily.   triamcinolone cream (KENALOG) 0.1 % Apply 1 Application topically as needed.   [DISCONTINUED] ELIQUIS  5 MG TABS tablet TAKE 1 TABLET BY MOUTH TWICE A DAY   [DISCONTINUED] rosuvastatin  (CRESTOR ) 5 MG tablet Take 1 tablet (5 mg total) by mouth daily.   apixaban  (ELIQUIS ) 5 MG TABS tablet Take 1 tablet (5 mg total) by mouth 2 (two) times daily.   rosuvastatin  (CRESTOR ) 5 MG tablet Take 1 tablet (5 mg total) by mouth daily.   No facility-administered encounter medications on file as of 07/27/2024.   No Known Allergies  Social History   Socioeconomic History   Marital status: Married    Spouse name: Not on file   Number of children: 5   Years of education: Not on file   Highest education level: 12th grade  Occupational History   Occupation: retired  Tobacco Use   Smoking status: Never   Smokeless tobacco: Never  Vaping Use   Vaping status: Never Used  Substance and Sexual Activity   Alcohol use: No    Alcohol/week: 0.0 standard drinks of alcohol   Drug use: No   Sexual activity: Yes  Other Topics Concern   Not on file  Social History Narrative   Grew up in [place]. Lives with husband in a 2 story home.  Has 5 children. (1 set of twins)   Retired architectural technologist.     Education: some college.    Social Drivers of Corporate Investment Banker Strain: Low Risk  (01/26/2024)   Overall Financial Resource Strain (CARDIA)    Difficulty of Paying Living Expenses: Not hard at all  Food Insecurity: No Food Insecurity (01/26/2024)   Hunger Vital Sign    Worried About Running Out of Food in the Last Year: Never true    Ran Out of Food in the Last Year: Never true  Transportation Needs: No  Transportation Needs (01/26/2024)   PRAPARE - Administrator, Civil Service (Medical): No    Lack of Transportation (Non-Medical): No  Physical Activity: Insufficiently Active (01/26/2024)   Exercise Vital Sign    Days of Exercise per Week: 3 days    Minutes of Exercise per Session: 30 min  Stress: No Stress Concern Present (01/26/2024)   [employer] of Occupational Health - Occupational Stress Questionnaire    Feeling of Stress : Not at all  Social Connections: Moderately Integrated (01/26/2024)   Social Connection and Isolation Panel    Frequency of Communication with Friends and Family: More than three times a week    Frequency of Social Gatherings with Friends and Family: Once a week    Attends Religious Services: More than 4 times per year    Active Member of [employer] or Organizations: No    Attends Banker Meetings: Never    Marital Status: Married   Family History  Problem Relation Age of Onset   Breast cancer Mother 63   Throat cancer Father    Thyroid  cancer Daughter    Colon cancer Neg Hx      Review of Systems  Constitutional: Negative.   Respiratory: Negative.    Cardiovascular: Negative.   Musculoskeletal: Negative.   Neurological: Negative.   Psychiatric/Behavioral: Negative.      Per HPI unless specifically indicated above     Objective:    BP (!) 149/79   Pulse 69   Temp 97.9 F (36.6 C) (Oral)   Ht 5' 3 (1.6 m)   Wt 164 lb (74.4 kg)   SpO2 98%   BMI 29.05 kg/m   Wt Readings from Last 3 Encounters:  07/27/24 164 lb (74.4 kg)  04/01/24 162 lb 12.8 oz (73.8 kg)  02/24/24 161 lb 6.4 oz (73.2 kg)    Physical Exam Vitals and nursing note reviewed.  Constitutional:      General: She is not in acute distress.    Appearance: Normal appearance. She is not ill-appearing, toxic-appearing or diaphoretic.  HENT:     Head: Normocephalic and atraumatic.     Right Ear: External ear normal.     Left Ear: External ear normal.      Nose: Nose normal.     Mouth/Throat:     Mouth: Mucous membranes are moist.     Pharynx: Oropharynx is clear.  Eyes:     General: No scleral icterus.       Right eye: No discharge.        Left eye: No discharge.     Extraocular Movements: Extraocular movements intact.     Conjunctiva/sclera: Conjunctivae normal.     Pupils: Pupils are equal, round, and reactive to light.  Cardiovascular:     Rate and Rhythm: Normal rate and regular rhythm.     Pulses: Normal pulses.     Heart sounds: Normal heart sounds. No murmur heard.    No friction rub. No gallop.  Pulmonary:     Effort: Pulmonary effort is normal. No respiratory distress.     Breath sounds: Normal breath sounds. No stridor. No wheezing, rhonchi or rales.  Chest:     Chest wall: No tenderness.  Musculoskeletal:        General: Normal range of motion.     Cervical back: Normal range of motion and neck supple.  Skin:    General: Skin is warm and dry.     Capillary Refill: Capillary refill takes less than 2 seconds.     Coloration: Skin is not jaundiced or pale.     Findings: No bruising, erythema, lesion or rash.  Neurological:     General: No focal deficit present.     Mental Status: She is alert and oriented to person, place, and time. Mental status is at baseline.  Psychiatric:        Mood and Affect: Mood normal.        Behavior: Behavior normal.        Thought Content: Thought content normal.        Judgment: Judgment normal.     Results for orders placed or performed in visit on 07/27/24  Bayer DCA Hb A1c Waived   Collection Time: 07/27/24 10:13 AM  Result Value Ref Range   HB A1C (BAYER DCA - WAIVED) 5.5 4.8 - 5.6 %  Microalbumin, Urine Waived   Collection Time: 07/27/24 10:13 AM  Result Value Ref Range   Microalb, Ur  Waived 30 (H) 0 - 19 mg/L   Creatinine, Urine Waived 100 10 - 300 mg/dL   Microalb/Creat Ratio <30 <30 mg/g      Assessment & Plan:   Problem List Items Addressed This Visit        Cardiovascular and Mediastinum   HTN (hypertension)   Running slightly high- will work on [employer] and continue to monitor at home. Call with any concerns.       Relevant Medications   apixaban  (ELIQUIS ) 5 MG TABS tablet   rosuvastatin  (CRESTOR ) 5 MG tablet   Other Relevant Orders   Comprehensive metabolic panel with GFR   Microalbumin, Urine Waived (Completed)   Pulmonary hypertension (HCC)   Continue to follow with cardiology. Call with any concerns. Continue to monitor.       Relevant Medications   apixaban  (ELIQUIS ) 5 MG TABS tablet   rosuvastatin  (CRESTOR ) 5 MG tablet   PAF (paroxysmal atrial fibrillation) (HCC)   Continue to follow with cardiology. Call with any concerns. Continue to monitor.       Relevant Medications   apixaban  (ELIQUIS ) 5 MG TABS tablet   rosuvastatin  (CRESTOR ) 5 MG tablet   RESOLVED: Atrial fibrillation with RVR (HCC)   Relevant Medications   apixaban  (ELIQUIS ) 5 MG TABS tablet   rosuvastatin  (CRESTOR ) 5 MG tablet   Other Relevant Orders   CBC with Differential/Platelet     Endocrine   Graves disease   Rechecking labs today. Await results. Treat as needed. Continue to follow with endocrinology.       Relevant Orders   Thyroid  Panel With TSH   Other Visit Diagnoses       Postmenopausal estrogen deficiency    -  Primary   DEXA ordered today. Await results.   Relevant Orders   DG Bone Density     Hyperglycemia       Rechecking labs today. Await results. Treat as needed.   Relevant Orders   Bayer DCA Hb A1c Waived (Completed)     Screening for cardiovascular condition       Rechecking labs today. Await results. Treat as needed.   Relevant Orders   Lipid Panel w/o Chol/HDL Ratio     Need for hepatitis C screening test       Labs drawn today. Await results. Treat as needed.   Relevant Orders   Hepatitis C Antibody        Follow up plan: Return in about 6 months (around 01/25/2025) for physical.

## 2024-07-27 NOTE — Assessment & Plan Note (Signed)
Rechecking labs today. Await results. Treat as needed. Continue to follow with endocrinology.

## 2024-07-27 NOTE — Assessment & Plan Note (Deleted)
 Continue to follow with cardiology. Call with any concerns. Continue to monitor.

## 2024-07-27 NOTE — Assessment & Plan Note (Signed)
 Continue to follow with cardiology. Call with any concerns. Continue to monitor.

## 2024-07-28 ENCOUNTER — Ambulatory Visit: Payer: Self-pay | Admitting: Family Medicine

## 2024-07-28 LAB — CBC WITH DIFFERENTIAL/PLATELET
Basophils Absolute: 0.1 x10E3/uL (ref 0.0–0.2)
Basos: 1 %
EOS (ABSOLUTE): 0.2 x10E3/uL (ref 0.0–0.4)
Eos: 2 %
Hematocrit: 36.1 % (ref 34.0–46.6)
Hemoglobin: 12 g/dL (ref 11.1–15.9)
Immature Grans (Abs): 0.1 x10E3/uL (ref 0.0–0.1)
Immature Granulocytes: 1 %
Lymphocytes Absolute: 1.8 x10E3/uL (ref 0.7–3.1)
Lymphs: 18 %
MCH: 31.1 pg (ref 26.6–33.0)
MCHC: 33.2 g/dL (ref 31.5–35.7)
MCV: 94 fL (ref 79–97)
Monocytes Absolute: 0.8 x10E3/uL (ref 0.1–0.9)
Monocytes: 8 %
Neutrophils Absolute: 7 x10E3/uL (ref 1.4–7.0)
Neutrophils: 70 %
Platelets: 323 x10E3/uL (ref 150–450)
RBC: 3.86 x10E6/uL (ref 3.77–5.28)
RDW: 12.4 % (ref 11.7–15.4)
WBC: 9.9 x10E3/uL (ref 3.4–10.8)

## 2024-07-28 LAB — COMPREHENSIVE METABOLIC PANEL WITH GFR
ALT: 12 IU/L (ref 0–32)
AST: 19 IU/L (ref 0–40)
Albumin: 4.4 g/dL (ref 3.8–4.8)
Alkaline Phosphatase: 77 IU/L (ref 49–135)
BUN/Creatinine Ratio: 20 (ref 12–28)
BUN: 24 mg/dL (ref 8–27)
Bilirubin Total: 0.4 mg/dL (ref 0.0–1.2)
CO2: 21 mmol/L (ref 20–29)
Calcium: 9.4 mg/dL (ref 8.7–10.3)
Chloride: 98 mmol/L (ref 96–106)
Creatinine, Ser: 1.19 mg/dL — ABNORMAL HIGH (ref 0.57–1.00)
Globulin, Total: 2.5 g/dL (ref 1.5–4.5)
Glucose: 74 mg/dL (ref 70–99)
Potassium: 4.7 mmol/L (ref 3.5–5.2)
Sodium: 137 mmol/L (ref 134–144)
Total Protein: 6.9 g/dL (ref 6.0–8.5)
eGFR: 47 mL/min/1.73 — ABNORMAL LOW (ref >59)

## 2024-07-28 LAB — HEPATITIS C ANTIBODY: Hep C Virus Ab: NONREACTIVE

## 2024-07-28 LAB — LIPID PANEL W/O CHOL/HDL RATIO
Cholesterol, Total: 122 mg/dL (ref 100–199)
HDL: 50 mg/dL (ref >39)
LDL Chol Calc (NIH): 57 mg/dL (ref 0–99)
Triglycerides: 71 mg/dL (ref 0–149)
VLDL Cholesterol Cal: 15 mg/dL (ref 5–40)

## 2024-07-28 LAB — THYROID PANEL WITH TSH
Free Thyroxine Index: 2 (ref 1.2–4.9)
T3 Uptake Ratio: 28 % (ref 24–39)
T4, Total: 7.3 ug/dL (ref 4.5–12.0)
TSH: 2.02 u[IU]/mL (ref 0.450–4.500)

## 2024-08-09 ENCOUNTER — Encounter: Payer: Self-pay | Admitting: Cardiology

## 2024-08-09 ENCOUNTER — Ambulatory Visit

## 2024-08-09 ENCOUNTER — Ambulatory Visit: Admitting: Cardiology

## 2024-08-09 ENCOUNTER — Telehealth: Payer: Self-pay | Admitting: Cardiovascular Disease

## 2024-08-09 VITALS — BP 128/58 | HR 65 | Ht 63.0 in | Wt 164.4 lb

## 2024-08-09 DIAGNOSIS — I10 Essential (primary) hypertension: Secondary | ICD-10-CM

## 2024-08-09 DIAGNOSIS — I480 Paroxysmal atrial fibrillation: Secondary | ICD-10-CM

## 2024-08-09 DIAGNOSIS — D6869 Other thrombophilia: Secondary | ICD-10-CM

## 2024-08-09 NOTE — Telephone Encounter (Signed)
 Called patient back after receiving message from Lonni Meager, NP stating he felt pt would be better served seeing EP for her AFIB symptoms; pt agreeable to see Suzann Riddle, NP today at 1:55pm.  Pt states that she feels symptoms mostly at night after taking Diltazem 240 mg.

## 2024-08-09 NOTE — Patient Instructions (Signed)
 Medication Instructions:  Your physician recommends that you continue on your current medications as directed. Please refer to the Current Medication list given to you today.  *If you need a refill on your cardiac medications before your next appointment, please call your pharmacy*  Lab Work: No labs ordered today    Testing/Procedures: ZIO XT- Long Term Monitor Instructions  Your physician has requested you wear a ZIO patch monitor for 14 days.  This is a single patch monitor. [employer] supplies one patch monitor per enrollment. Additional stickers are not available. Please do not apply patch if you will be having a Nuclear Stress Test, Echocardiogram, Cardiac CT, MRI, or Chest Xray during the period you would be wearing the monitor. The patch cannot be worn during these tests. You cannot remove and re-apply the ZIO XT patch monitor.  Your ZIO patch monitor will be mailed 3 day USPS to your address on file. It may take 3-5 days to receive your monitor after you have been enrolled. Once you have received your monitor, please review the enclosed instructions. Your monitor has already been registered assigning a specific monitor serial number to you.  Billing and Patient Assistance Program Information  We have supplied [employer] with any of your insurance information on file for billing purposes.  [employer] offers a sliding scale Patient Assistance Program for patients that do not have insurance, or whose insurance does not completely cover the cost of the ZIO monitor.  You must apply for the Patient Assistance Program to qualify for this discounted rate.  To apply, please call [employer] at [phone], select option 4, select option 2, ask to apply for Patient Assistance Program. Meredeth will ask your household income, and how many people are in your household. They will quote your out-of-pocket cost based on that information. [employer] will also be able to set up a 55-month, interest-free payment plan if  needed.  Applying the monitor   Shave hair from upper left chest.  Hold abrader disc by orange tab. Rub abrader in 40 strokes over the upper left chest as indicated in your monitor instructions.  Clean area with 4 enclosed alcohol pads. Let dry.  Apply patch as indicated in monitor instructions. Patch will be placed under collarbone on left side of chest with arrow pointing upward.  Rub patch adhesive wings for 2 minutes. Remove white label marked 1. Remove the white label marked 2. Rub patch adhesive wings for 2 additional minutes.  While looking in a mirror, press and release button in center of patch. A small green light will flash 3-4 times. This will be your only indicator that the monitor has been turned on.  Do not shower for the first 24 hours. You may shower after the first 24 hours.  Press the button if you feel a symptom. You will hear a small click. Record Date, Time and Symptom in the Patient Logbook.  When you are ready to remove the patch, follow instructions on the last 2 pages of Patient Logbook.  Stick patch monitor into the tabs at the bottom of the return box.  Place Patient Logbook in the blue and white box. Use locking tab on box and tape box closed securely. The blue and white box has prepaid postage on it. Please place it in the mailbox as soon as possible. Your physician should have your test results approximately 7-14 days after the monitor has been mailed back to [facility].  Call [facility] Customer Care at [phone] if you have questions  regarding your ZIO XT patch monitor.  Call them immediately if you see an orange light blinking on your monitor.  If your monitor falls off in less than 4 days, contact our Monitor department at [phone].  If your monitor becomes loose or falls off after 4 days call [employer] at [phone] for suggestions on securing your monitor.   Follow-Up:  Home Blood Pressure Log. Take once a day, one hour after taking  medication.   At [facility], you and your health needs are our priority.  As part of our continuing mission to provide you with exceptional heart care, our providers are all part of one team.  This team includes your primary Cardiologist (physician) and Advanced Practice Providers or APPs (Physician Assistants and Nurse Practitioners) who all work together to provide you with the care you need, when you need it.  Your next appointment:   6 week(s)  Provider:   Suzann Riddle, NP

## 2024-08-09 NOTE — Progress Notes (Signed)
 Electrophysiology Clinic Note    Date:  08/09/2024  Patient ID:  Doris [name], Doris [name] 04/03/1946, MRN [id number] PCP:  Vicci Duwaine SQUIBB, DO  Cardiologist:  Deatrice Cage, MD  Electrophysiologist:  Soyla Gladis Norton, MD     Discussed the use of AI scribe software for clinical note transcription with the patient, who gave verbal consent to proceed.   Patient Profile    Chief Complaint: Afib  History of Present Illness: Doris [name] is a 78 y.o. female with PMH notable for parox AFib, SVT, pulmHTN, HTN, Graves' disease ; seen today for Will Gladis Norton, MD for acute visit due to AF.    I last saw patient 02/2024 for EP follow-up of her A-fib on flecainide .  She had had significant side effects and GI distress while on flecainide  and had self stopped the medication.  She felt significantly better after stopping and had not had any recurrent A-fib.  At the visit she preferred to monitor her A-fib burden instead of starting alternative AAD. She saw Dr. Cage 03/2024 at which time she reported no A-fib episodes. She called clinic earlier today that she had had A-fib episode 12/12 and 12/15 and appointment scheduled.  These two episodes are the only AFib she can recall since our last appointment. These episodes did not feel like her previous AFib where her heart was racing, this episode was more of a sensation that her heart was beating hard in her chest. She took her PRN metop with improvement in symptoms within about 20 minutes. NO chest pain, chest pressure, SOB, dizziness, or presyncope with episodes.  She does admit to being under more stress than usual with large family at the holidays, and selling a home she inherited.  She continues to take eliquis  BID, no bleeding concerns.   She is also concerned with her BP control. Notes readings at home int he 130-140s at times, other times in the 120s systolic. She did not bring BP log to today's appointment.      Arrhythmia/Device  History Flecainide  - stopped d/t SE    ROS:  Please see the history of present illness. All other systems are reviewed and otherwise negative.    Physical Exam    VS:  BP (!) 128/58   Pulse 65   Ht 5' 3 (1.6 m)   Wt 164 lb 6.4 oz (74.6 kg)   SpO2 99%   BMI 29.12 kg/m  BMI: Body mass index is 29.12 kg/m.           Wt Readings from Last 3 Encounters:  08/09/24 164 lb 6.4 oz (74.6 kg)  07/27/24 164 lb (74.4 kg)  04/01/24 162 lb 12.8 oz (73.8 kg)     GEN- The patient is well appearing, alert and oriented x 3 today.   Lungs- Clear to ausculation bilaterally, normal work of breathing.  Heart- Regular rate and rhythm, no murmurs, rubs or gallops Extremities- No peripheral edema, warm, dry   Studies Reviewed   Previous EP, cardiology notes.    EKG is ordered. Personal review of EKG from today shows:    EKG Interpretation Date/Time:  Tuesday August 09 2024 13:57:21 EST Ventricular Rate:  65 PR Interval:  132 QRS Duration:  68 QT Interval:  396 QTC Calculation: 411 R Axis:   -4  Text Interpretation: Normal sinus rhythm Normal ECG Confirmed by Erienne Spelman [phone]) on 08/09/2024 2:01:54 PM    Long term monitor, 02/02/2024 Patient had a min HR of  46 bpm, max HR of 143 bpm, and avg HR of 62 bpm. Predominant underlying rhythm was Sinus Rhythm.  1 run of Supraventricular Tachycardia occurred lasting 18 beats with a max rate of 143 bpm (avg 110 bpm).  Rare PACs and PVCs.   ETT, 01/19/2024   Baseline ECG demonstrates normal sinus rhythm without significant abnormalities.   The patient exhibits reduced functional capacity, requiring modification of the Bruce protocol to exercise 6:11 minutes (5.3 METS).  Target heart rate was not achieved (max HR 111 bpm; 78% MPHR).  She reported fatigue but no angina.   No significant ST segment or T wave abnormalities were observed.   QRS widening was noted during stress (QRS 100 -> 120 ms).  No significant arrhythmia was observed.    Study is non-diagnostic for ischemia due to failure to achieve target heart rate.   TTE, 01/12/2024  1. Left ventricular ejection fraction, by estimation, is 60 to 65%. The left ventricle has normal function. The left ventricle has no regional wall motion abnormalities. Left ventricular diastolic parameters were normal.   2. Right ventricular systolic function is normal. The right ventricular size is normal. There is moderately elevated pulmonary artery systolic pressure. The estimated right ventricular systolic pressure is 55.7 mmHg.   3. The mitral valve is grossly normal. Trivial mitral valve regurgitation. No evidence of mitral stenosis.   4. The aortic valve is tricuspid. There is moderate thickening of the aortic valve. Aortic valve regurgitation is not visualized. No aortic stenosis is present. Aortic valve Vmax measures 1.49 m/s.   5. The inferior vena cava is normal in size with greater than 50% respiratory variability, suggesting right atrial pressure of 3 mmHg.   Comparison(s): Echocardiogram done 12/19/22 showed an EF of 55-60%, RVSP was 440.31mm Hg.   Assessment and Plan     #) parox AFib #) palpitations Historically low burden but has had 2 episodes recently with main symptom of heart beating hard in chest. Episodes resolved with PRN metop Will update 2 week zio to eval AF burden Given that these episodes are occurring during times of increase stress, will defer ablation or AAD initiation at this time Continue 240mg  diltiazem  daily Continue 12.5mg  lopressor  PRN Recent labs with stable electrolytes, thyroid  WNL  #) Hypercoag d/t parox afib CHA2DS2-VASc Score = at least 4 [CHF History: 0, HTN History: 1, Diabetes History: 0, Stroke History: 0, Vascular Disease History: 0, Age Score: 2, Gender Score: 1].  Therefore, the patient's annual risk of stroke is 4.8 %.    Stroke ppx - 5mg  eliquis  BID, appropriately dosed No bleeding concerns  #) HTN Bp well-controlled in office  today Encouraged her to check BP once a day at least 1 hour after medications and record measurements. We discussed that BP trend will guide medication adjustments and not single elevated readings Continue 100mg  losartan  daily Continue dilt as above Continue 25mg  spironolactone        Current medicines are reviewed at length with the patient today.   The patient has concerns regarding her medicines.  The following changes were made today:  none  Labs/ tests ordered today include:  Orders Placed This Encounter  Procedures   LONG TERM MONITOR (3-14 DAYS)   EKG 12-Lead     Disposition: Follow up with Dr. Kennyth or EP APP in 6 weeks    Signed, Chantal Needle, NP  08/09/2024  2:39 PM  Electrophysiology [facility]

## 2024-08-09 NOTE — Telephone Encounter (Signed)
°  How long have you had palpitations/irregular HR/ Afib? Are you having the symptoms now? No, had episode 12/13 and 12/15   Are you currently experiencing lightheadedness, SOB or CP? No   Do you have a history of afib (atrial fibrillation) or irregular heart rhythm? Yes   Have you checked your BP or HR? (document readings if available): no   Are you experiencing any other symptoms? No    Patient has had two afib episodes in the past week. 12/13 and night of 12/15. Pt took metoprolol  and this helped symptoms, but she is concerned because she had not had afib since last march. Pt also states her pcp was concerned at her last appt due to high bp. Please advise.

## 2024-08-09 NOTE — Telephone Encounter (Signed)
 Returned pt's called; identified pt using two identifiers; pt states that while the Metoprolol  helped with AFIB symptoms, she remains concerned because she has had 2 recent episodes of AFIB; feels these have been brought on by stress and elevated BP; wants to schedule an appointment to discuss her medications to see if any changes need to be made; concerned with upcoming holidays and added stress and feels Afib episodes need to be addressed before her appointment with Dr. Darron in February 2026.  An appointment with Lonni Meager, NP obtained for Friday, 08/12/2024 @ 10:05 a.m.  Pt thanked me for returning her call and getting an appointment scheduled.  All questions answered.

## 2024-08-12 ENCOUNTER — Ambulatory Visit: Admitting: Nurse Practitioner

## 2024-09-04 DIAGNOSIS — I480 Paroxysmal atrial fibrillation: Secondary | ICD-10-CM

## 2024-09-05 ENCOUNTER — Ambulatory Visit: Payer: Self-pay | Admitting: Cardiology

## 2024-09-09 ENCOUNTER — Ambulatory Visit: Admitting: Internal Medicine

## 2024-09-09 ENCOUNTER — Encounter: Payer: Self-pay | Admitting: Internal Medicine

## 2024-09-09 VITALS — BP 130/70 | HR 71 | Ht 63.0 in | Wt 163.4 lb

## 2024-09-09 DIAGNOSIS — E0500 Thyrotoxicosis with diffuse goiter without thyrotoxic crisis or storm: Secondary | ICD-10-CM

## 2024-09-09 MED ORDER — METHIMAZOLE 5 MG PO TABS: 5.0000 mg | ORAL_TABLET | ORAL | 3 refills | Status: AC

## 2024-09-09 NOTE — Progress Notes (Signed)
 Patient ID: Doris [name], female   DOB: Jun 02, 1946, 79 y.o.   MRN: [id number]  HPI  Doris [name] is a 79 y.o.-year-old very pleasant female, initially referred by her PCP, Damien Coupe, NP ([facility]), presenting for follow-up for Graves ds. Last visit 1 year ago. Prev. PCP - Manuelita Flatness [facility]). New PCP - Dr. Duwaine Louder  Interim history: No tremors, unintentional weight loss, heat intolerance. Occasional palpitations (she has Afib). She previously had felt loss, but this resolved.  She had a stressful year, but she has not been sick since last visit.  Reviewed and addended history: She had a gastroenteritis on 10/25/2014 >> felt weak after this. She presented to [facility] for the weakness - TSH found low mid-March. She then had the TSH rechecked at a visit with PCP: TSH still low.   She had weakness in the past with her thyrotoxic episodes, including winter 2016 >> TFTs abnormal >> started MMI 5 mg daily in 10/2015 >> we were able to decrease the dose to 2.5 mg daily (5 mg qod) in 12/2015.   12/2017: TFTs were normal on 2.5 mg of methimazole  daily.  01/2019: We stopped methimazole  after a TSH returned slightly elevated  TFTs were normal afterwards until 08/2020. She has chronic sinusitis with frequent exacerbations.   A TSH was low x2 in 08/2020.  We started back on methimazole  2.5 mg daily (she takes 5 mg every other day).  However, in 03/2022, she came off methimazole  again.   In 08/2022, we restarted MMI 2.5 mg daily (5 mg every other day).  Reviewed patient's TFTs: Lab Results  Component Value Date   TSH 2.020 07/27/2024   TSH 1.77 02/03/2024   TSH 2.37 11/10/2023   TSH 2.59 09/21/2023   TSH 2.78 03/19/2023   FREET4 1.2 02/03/2024   FREET4 1.2 11/10/2023   FREET4 1.1 09/21/2023   FREET4 0.69 03/19/2023   FREET4 0.60 10/23/2022  08/10/2023: TSH 2.23 09/07/2020: TSH 0.173 (0.6-3.3) 04/06/2019: TSH 2.14, free T4 1.18, free T3  2.95, all normal 02/10/2019: TSH 6.6 11/21/2014: TSH 0.229 (0.450-4.5), TT4 11.9 (4.5-12)  11/04/2014: TSH 0.326 02/2014: TSH 2.530  Her TSI level was normal at last check: Lab Results  Component Value Date   TSI 203 (H) 09/21/2023   TSI 108 01/11/2018    05/29/2015: Thyroid  uptake and scan: The 24 hour radioactive iodine uptake is equal to 40.9%. There is uniform tracer uptake throughout both lobes. No dominant hot or cold nodule identified. IMPRESSION: 1. Elevated 24 hour radioactive uptake equal 40.9%. 2. Patient's hyperthyroidism may be due to Graves disease.  She saw Dr. Darron in the past for palpitations.  She was diagnosed with atrial fibrillation.  On Eliquis  and Lopressor .  She also has pulmonary hypertension.  Pt denies: - feeling nodules in neck - hoarseness - dysphagia - choking  + Family history of thyroid  cancer in daughter. No h/o radiation tx to head or neck. No herbal supplements. No Biotin use. No recent steroids use.   ROS: + see HPI  I reviewed pt's medications, allergies, PMH, social hx, family hx, and changes were documented in the history of present illness. Otherwise, unchanged from my initial visit note.  Past Medical History:  Diagnosis Date   Colitis    Graves disease    Hyperlipidemia    Hypertension    Hyperthyroidism    PAF (paroxysmal atrial fibrillation) (HCC)    a. Dx 03/2019 -->AF RVR @ [facility];  b. 04/2019 Zio: <1% Afib burden; c. CHA2DS2VASc = 4-->Eliquis ; d. 12/2023 ETT Ex 6:11 (5.3 METs). 78% MPHR acheived. Mild QRS widening ( ->120 msec); e. 01/2024 Zio: predominantly sinus rhythm at an average rate of 62 with 1 brief run of SVT and rare PACs and PVCs.   PAH (pulmonary artery hypertension) (HCC)    a. 05/2017 Echo: EF 60-65%, PASP ; b. 04/2019 Echo: EF 60-65% PASP ; c. 10/2021 Echo: EF 60-65%, RVSP 42.41mmHg; d. 11/2022 Echo: EF 55-60%, RVSP 44.7 mmHg; e. 12/2023 Echo: EF 60-65%, no rwma, nl RV fxn, RVSP 55.67mmHg. Triv MR; f.  01/2024 RHC: PA 30/8 (17). RVSP 43/2 - elev RV pressure due to mild PV stenosis.   Palpitations    a. 05/2017 Echo: EF 60-65%; b. 05/2017 48h Holter: Rare PACs and PVCs. 11 beat run of SVT w/ aberrancy; c. 04/2019 Zio: Avg HR 56. 37 episodes of SVT, longest 19. Intermittent AF (<1% burden, longest 1:39). Rare PVCs.   Positive TB test    Pulmonic valve stenosis    a. 01/2024 RHC: PA 30/8 (17), PV syst gradient of .  RV 43/78mmHg. Elev RV pressure due to mild PV stenosis.   Valvular heart disease    a. 10/2021 Echo: Mild MR, Mod TR.   Past Surgical History:  Procedure Laterality Date   CESAREAN SECTION     x 2   EXCISION METACARPAL MASS Right 12/17/2017   Procedure: EXCISION METACARPAL MASS;  Surgeon: Kathlynn Sharper, MD;  Location: [facility] ORS;  Service: Orthopedics;  Laterality: Right;   RIGHT HEART CATH Right 01/25/2024   Procedure: RIGHT HEART CATH;  Surgeon: Darron Deatrice LABOR, MD;  Location: [facility] INVASIVE CV LAB;  Service: Cardiovascular;  Laterality: Right;    History   Social History   Marital Status: Married    Spouse Name: N/A   Number of Children: 5   Occupational History   Retired runner, broadcasting/film/video   Social History Main Topics   Smoking status: Never Smoker    Smokeless tobacco: Not on file   Alcohol Use: No   Drug Use: No   Current Outpatient Medications on File Prior to Visit  Medication Sig Dispense Refill   acetaminophen  (TYLENOL ) 325 MG tablet Take 325 mg by mouth every 6 (six) hours as needed for headache.      apixaban  (ELIQUIS ) 5 MG TABS tablet Take 1 tablet (5 mg total) by mouth 2 (two) times daily. 180 tablet 1   Carboxymethylcellul-Glycerin (LUBRICATING EYE DROPS OP) Place 2 drops into both eyes daily as needed (dry eyes).     cetirizine (ZYRTEC) 10 MG tablet Take 10 mg by mouth daily as needed for allergies.     Cholecalciferol (VITAMIN D3) 25 MCG (1000 UT) CAPS Take 1,000 Units by mouth daily.     dexamethasone (DECADRON) 0.5 MG/5ML solution Take 0.5 mg by mouth daily as  needed (sore gums).     diltiazem  (CARDIZEM  CD) 240 MG 24 hr capsule Take 1 capsule (240 mg total) by mouth every evening. 90 capsule 3   losartan  (COZAAR ) 100 MG tablet TAKE 1 TABLET BY MOUTH EVERY DAY 90 tablet 3   methimazole  (TAPAZOLE ) 5 MG tablet Take 1 tablet (5 mg total) by mouth every other day. 45 tablet 3   metoprolol  tartrate (LOPRESSOR ) 25 MG tablet Take 12.5 mg by mouth daily as needed.     Multiple Vitamin (MULTIVITAMIN WITH MINERALS) TABS tablet Take 1 tablet by mouth daily.     rosuvastatin  (CRESTOR ) 5 MG tablet Take  1 tablet (5 mg total) by mouth daily. 90 tablet 1   spironolactone  (ALDACTONE ) 25 MG tablet Take 1 tablet (25 mg total) by mouth daily. 90 tablet 2   triamcinolone cream (KENALOG) 0.1 % Apply 1 Application topically as needed.     No current facility-administered medications on file prior to visit.   No Known Allergies   FH: - HTN in M - HL in M, sister - also see HPI  PE: BP 130/70   Pulse 71   Ht 5' 3 (1.6 m)   Wt 163 lb 6.4 oz (74.1 kg)   SpO2 99%   BMI 28.95 kg/m   Wt Readings from Last 10 Encounters:  09/09/24 163 lb 6.4 oz (74.1 kg)  08/09/24 164 lb 6.4 oz (74.6 kg)  07/27/24 164 lb (74.4 kg)  04/01/24 162 lb 12.8 oz (73.8 kg)  02/24/24 161 lb 6.4 oz (73.2 kg)  02/10/24 161 lb 8 oz (73.3 kg)  02/03/24 160 lb 3.2 oz (72.7 kg)  01/26/24 162 lb (73.5 kg)  01/25/24 162 lb (73.5 kg)  01/14/24 162 lb 9.6 oz (73.8 kg)   Constitutional: normal weight, in NAD Eyes:  EOMI, no exophthalmos ENT: no neck masses, no cervical lymphadenopathy Cardiovascular: RRR, No MRG Respiratory: CTA B Musculoskeletal: no deformities Skin:no rashes Neurological: no tremor with outstretched hands  ASSESSMENT: 1. Graves ds.   PLAN:  1.  -Patient with history of Graves' disease, with improved TFTs on methimazole , which we were then able to decrease and then stop completely in 01/2019.  TFTs remained controlled for 1.5 years afterwards, but we had to restart the  thionamide as TSH dropped lower than the lower limit of the target range (with still normal free thyroid  hormones).  We then continued 2.5 mg daily, but in 03/2022, she stopped methimazole  completely again.  TFTs were normal until 08/2022, when TSH became again suppressed.  We started back on methimazole  2.5 mg daily with normal TFTs afterwards. - She recently had a TSH checked by PCP in 07/2024 and his was normal - She denies hot flashes, unintentional weight loss, anxiety, tremors.  She has a history of longstanding hair loss, but this resolved recently. - She continues on the beta-blocker: Lopressor  12.5 mg daily as needed, per cardiology.  She does have occasional palpitations. - at today's visit, I advised her to continue methimazole  at the same dose, 2.5 mg daily.  She was previously not able to tolerate stopping the methimazole  for a longer period of time, and we discussed that this dose is safe and effective for Graves' disease control - At today's visit, I refilled her methimazole  prescription - we discussed about possibly retrying to take her off the methimazole  only if the TSH levels increases above the target range consistently - Of note, she prefers to be called with results rather than sending her the results through MyChart - I will see her in a year, but with labs at 6 months.  She mentions that she has another appointment with PCP in 6 months and I advised her that it would be ok to obtain them in our office and have the results sent to me.  I will still order them today in case she decides to come here for labs.  Orders Placed This Encounter  Procedures   TSH   T4, free   T3, free   Requested Prescriptions   Signed Prescriptions Disp Refills   methimazole  (TAPAZOLE ) 5 MG tablet 45 tablet 3    Sig:  Take 1 tablet (5 mg total) by mouth every other day.   Lela Fendt, MD PhD [facility] Endocrinology

## 2024-09-09 NOTE — Patient Instructions (Signed)
 Please continue  methimazole 2.5 mg daily.  Please stop at the lab.  Please come back for a follow-up appointment in 1 year but for labs in 6 months.

## 2024-09-14 ENCOUNTER — Telehealth: Payer: Self-pay

## 2024-09-14 NOTE — Telephone Encounter (Signed)
 Documentation for ICD codes faxed to [employer] at [phone]. Original and Fax Confirmation sent to be imaged for patient chart.

## 2024-09-19 NOTE — Progress Notes (Unsigned)
 "     Electrophysiology Clinic Note    Date:  09/19/2024  Patient ID:  Doris [name], Doris [name] May 08, 1946, MRN [id number] PCP:  Vicci Duwaine SQUIBB, DO  Cardiologist:  Deatrice Cage, MD  Electrophysiologist:  Will Gladis Norton, MD  Electrophysiology APP:  Lianna Sitzmann, NP     Discussed the use of AI scribe software for clinical note transcription with the patient, who gave verbal consent to proceed.   Patient Profile    Chief Complaint: Afib  History of Present Illness: Doris [name] is a 79 y.o. female with PMH notable for parox AFib, SVT, pulmHTN, HTN, Graves' disease ; seen today for Will Gladis Norton, MD for routine EP follow-up.    She was previously on flecainide  for AFib but stopped d/t GI distress and was maintaining sinus off.  I saw her 07/2024 for acute visit d/t concern for AF episodes, but felt different than previous A - these felt like heart beating hard in chest. Had taken PRN metop wth improvement in symptoms. Updated 2 week ambulatory monitor showed SVT episodes not associated with symptom activation.   Today,  *** AF burden, symptoms *** palpitations *** bleeding concerns          Arrhythmia/Device History Flecainide  - stopped d/t SE    ROS:  Please see the history of present illness. All other systems are reviewed and otherwise negative.    Physical Exam    VS:  There were no vitals taken for this visit. BMI: There is no height or weight on file to calculate BMI.           Wt Readings from Last 3 Encounters:  09/09/24 163 lb 6.4 oz (74.1 kg)  08/09/24 164 lb 6.4 oz (74.6 kg)  07/27/24 164 lb (74.4 kg)     GEN- The patient is well appearing, alert and oriented x 3 today.   Lungs- Clear to ausculation bilaterally, normal work of breathing.  Heart- Regular rate and rhythm, no murmurs, rubs or gallops Extremities- No peripheral edema, warm, dry   Studies Reviewed   Previous EP, cardiology notes.    EKG is ordered. Personal review of  EKG from today shows:         Long term monitor, 08/31/2024 Patch Wear Time:  14 days and 0 hours (2025-12-19T09:15:25-0500 to 2026-01-02T09:15:25-0500)   HR 46 - 197, average 66 bpm. 45 nonsustained SVT (longest 15.5 seconds). No atrial fibrillation detected. Rare supraventricular ectopy. Rare ventricular ectopy. No sustained arrhythmias. No symptom trigger episodes.  Long term monitor, 02/02/2024 Patient had a min HR of 46 bpm, max HR of 143 bpm, and avg HR of 62 bpm. Predominant underlying rhythm was Sinus Rhythm.  1 run of Supraventricular Tachycardia occurred lasting 18 beats with a max rate of 143 bpm (avg 110 bpm).  Rare PACs and PVCs.   ETT, 01/19/2024   Baseline ECG demonstrates normal sinus rhythm without significant abnormalities.   The patient exhibits reduced functional capacity, requiring modification of the Bruce protocol to exercise 6:11 minutes (5.3 METS).  Target heart rate was not achieved (max HR 111 bpm; 78% MPHR).  She reported fatigue but no angina.   No significant ST segment or T wave abnormalities were observed.   QRS widening was noted during stress (QRS 100 -> 120 ms).  No significant arrhythmia was observed.   Study is non-diagnostic for ischemia due to failure to achieve target heart rate.   TTE, 01/12/2024  1. Left ventricular ejection fraction, by estimation, is  60 to 65%. The left ventricle has normal function. The left ventricle has no regional wall motion abnormalities. Left ventricular diastolic parameters were normal.   2. Right ventricular systolic function is normal. The right ventricular size is normal. There is moderately elevated pulmonary artery systolic pressure. The estimated right ventricular systolic pressure is 55.7 mmHg.   3. The mitral valve is grossly normal. Trivial mitral valve regurgitation. No evidence of mitral stenosis.   4. The aortic valve is tricuspid. There is moderate thickening of the aortic valve. Aortic valve regurgitation is  not visualized. No aortic stenosis is present. Aortic valve Vmax measures 1.49 m/s.   5. The inferior vena cava is normal in size with greater than 50% respiratory variability, suggesting right atrial pressure of 3 mmHg.   Comparison(s): Echocardiogram done 12/19/22 showed an EF of 55-60%, RVSP was 440.78mm Hg.   Assessment and Plan     #) parox AFib #) palpitations Historically low burden but has had 2 episodes recently with main symptom of heart beating hard in chest. Episodes resolved with PRN metop Will update 2 week zio to eval AF burden Given that these episodes are occurring during times of increase stress, will defer ablation or AAD initiation at this time Continue 240mg  diltiazem  daily Continue 12.5mg  lopressor  PRN Recent labs with stable electrolytes, thyroid  WNL  #) Hypercoag d/t parox afib CHA2DS2-VASc Score = at least 4 [CHF History: 0, HTN History: 1, Diabetes History: 0, Stroke History: 0, Vascular Disease History: 0, Age Score: 2, Gender Score: 1].  Therefore, the patient's annual risk of stroke is 4.8 %.    Stroke ppx - 5mg  eliquis  BID, appropriately dosed No bleeding concerns  #) HTN Bp well-controlled in office today Encouraged her to check BP once a day at least 1 hour after medications and record measurements. We discussed that BP trend will guide medication adjustments and not single elevated readings Continue 100mg  losartan  daily Continue dilt as above Continue 25mg  spironolactone        Current medicines are reviewed at length with the patient today.   The patient has concerns regarding her medicines.  The following changes were made today:  none  Labs/ tests ordered today include:  No orders of the defined types were placed in this encounter.    Disposition: Follow up with Dr. Kennyth or EP APP in 6 weeks    Signed, Corinthian Kemler, NP  09/19/24  7:17 PM  Electrophysiology [facility] "

## 2024-09-20 ENCOUNTER — Ambulatory Visit: Admitting: Cardiology

## 2024-09-20 ENCOUNTER — Ambulatory Visit: Payer: Medicare PPO | Admitting: Internal Medicine

## 2024-09-20 DIAGNOSIS — R002 Palpitations: Secondary | ICD-10-CM

## 2024-09-20 DIAGNOSIS — D6869 Other thrombophilia: Secondary | ICD-10-CM

## 2024-09-20 DIAGNOSIS — I480 Paroxysmal atrial fibrillation: Secondary | ICD-10-CM

## 2024-09-20 DIAGNOSIS — I471 Supraventricular tachycardia, unspecified: Secondary | ICD-10-CM

## 2024-09-29 ENCOUNTER — Ambulatory Visit: Admitting: Cardiovascular Disease

## 2024-10-11 ENCOUNTER — Ambulatory Visit: Admitting: Cardiovascular Disease

## 2025-01-26 ENCOUNTER — Ambulatory Visit: Admitting: Family Medicine

## 2025-01-31 ENCOUNTER — Ambulatory Visit

## 2025-03-06 ENCOUNTER — Other Ambulatory Visit

## 2025-09-04 ENCOUNTER — Ambulatory Visit: Admitting: Internal Medicine
